# Patient Record
Sex: Female | Born: 1990 | Race: Black or African American | Hispanic: No | Marital: Married | Smoking: Former smoker
Health system: Southern US, Community
[De-identification: ages and names within clinical notes are randomized; demographics above are authoritative.]

## PROBLEM LIST (undated history)

## (undated) DIAGNOSIS — O24419 Gestational diabetes mellitus in pregnancy, unspecified control: Secondary | ICD-10-CM

## (undated) DIAGNOSIS — E669 Obesity, unspecified: Secondary | ICD-10-CM

## (undated) DIAGNOSIS — Z789 Other specified health status: Secondary | ICD-10-CM

## (undated) DIAGNOSIS — Z800 Family history of malignant neoplasm of digestive organs: Secondary | ICD-10-CM

## (undated) HISTORY — DX: Obesity, unspecified: E66.9

## (undated) HISTORY — DX: Family history of malignant neoplasm of digestive organs: Z80.0

## (undated) HISTORY — PX: APPENDECTOMY: SHX54

## (undated) HISTORY — DX: Gestational diabetes mellitus in pregnancy, unspecified control: O24.419

---
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
## 2008-04-03 ENCOUNTER — Emergency Department: Payer: Self-pay | Admitting: Emergency Medicine

## 2009-03-12 ENCOUNTER — Observation Stay: Payer: Self-pay

## 2009-06-17 ENCOUNTER — Observation Stay: Payer: Self-pay | Admitting: Obstetrics & Gynecology

## 2009-06-25 ENCOUNTER — Inpatient Hospital Stay: Payer: Self-pay | Admitting: Obstetrics and Gynecology

## 2009-08-06 ENCOUNTER — Emergency Department: Payer: Self-pay | Admitting: Emergency Medicine

## 2009-08-24 ENCOUNTER — Emergency Department: Payer: Self-pay | Admitting: Emergency Medicine

## 2009-10-02 ENCOUNTER — Emergency Department: Payer: Self-pay | Admitting: Internal Medicine

## 2009-10-14 ENCOUNTER — Emergency Department: Payer: Self-pay | Admitting: Emergency Medicine

## 2009-10-27 ENCOUNTER — Emergency Department: Payer: Self-pay | Admitting: Emergency Medicine

## 2010-10-11 ENCOUNTER — Emergency Department: Payer: Self-pay | Admitting: Emergency Medicine

## 2012-03-30 ENCOUNTER — Emergency Department: Payer: Self-pay | Admitting: *Deleted

## 2013-11-19 ENCOUNTER — Inpatient Hospital Stay: Payer: Self-pay | Admitting: Obstetrics & Gynecology

## 2013-11-19 LAB — CBC WITH DIFFERENTIAL/PLATELET
Basophil #: 0.1 10*3/uL (ref 0.0–0.1)
Basophil %: 0.8 %
Eosinophil %: 2.1 %
HCT: 37.9 % (ref 35.0–47.0)
HGB: 11.5 g/dL — ABNORMAL LOW (ref 12.0–16.0)
Lymphocyte %: 25 %
MCH: 23.8 pg — ABNORMAL LOW (ref 26.0–34.0)
MCHC: 30.4 g/dL — ABNORMAL LOW (ref 32.0–36.0)
MCV: 78 fL — ABNORMAL LOW (ref 80–100)
Monocyte %: 8.5 %
Neutrophil #: 4.2 10*3/uL (ref 1.4–6.5)
Neutrophil %: 63.6 %
Platelet: 290 10*3/uL (ref 150–440)
RBC: 4.83 10*6/uL (ref 3.80–5.20)
RDW: 14.2 % (ref 11.5–14.5)
WBC: 6.6 10*3/uL (ref 3.6–11.0)

## 2013-11-19 LAB — BASIC METABOLIC PANEL
Anion Gap: 9 (ref 7–16)
BUN: 7 mg/dL (ref 7–18)
Calcium, Total: 8.9 mg/dL (ref 8.5–10.1)
Chloride: 107 mmol/L (ref 98–107)
Co2: 24 mmol/L (ref 21–32)
EGFR (Non-African Amer.): >60
Glucose: 104 mg/dL — ABNORMAL HIGH (ref 65–99)
Osmolality: 278 (ref 275–301)

## 2013-11-19 LAB — SGOT (AST)(ARMC): SGOT(AST): 22 U/L (ref 15–37)

## 2013-11-20 LAB — PROTEIN / CREATININE RATIO, URINE
Creatinine, Urine: 61.6 mg/dL (ref 30.0–125.0)
Protein/Creat. Ratio: 1055 mg/gCREAT — ABNORMAL HIGH (ref 0–200)

## 2013-11-20 LAB — HEMATOCRIT: HCT: 32.6 % — ABNORMAL LOW (ref 35.0–47.0)

## 2013-11-20 LAB — GC/CHLAMYDIA PROBE AMP

## 2014-06-27 ENCOUNTER — Emergency Department: Payer: Self-pay | Admitting: Emergency Medicine

## 2014-06-27 LAB — WET PREP, GENITAL

## 2014-06-27 LAB — CBC WITH DIFFERENTIAL/PLATELET
BASOS ABS: 0.1 10*3/uL (ref 0.0–0.1)
BASOS PCT: 1.2 %
EOS ABS: 0.3 10*3/uL (ref 0.0–0.7)
EOS PCT: 6.8 %
HCT: 40 % (ref 35.0–47.0)
HGB: 12.7 g/dL (ref 12.0–16.0)
Lymphocyte #: 2.4 10*3/uL (ref 1.0–3.6)
Lymphocyte %: 46.6 %
MCH: 26 pg (ref 26.0–34.0)
MCHC: 31.8 g/dL — ABNORMAL LOW (ref 32.0–36.0)
MCV: 82 fL (ref 80–100)
MONOS PCT: 7 %
Monocyte #: 0.4 x10 3/mm (ref 0.2–0.9)
NEUTROS PCT: 38.4 %
Neutrophil #: 2 10*3/uL (ref 1.4–6.5)
PLATELETS: 237 10*3/uL (ref 150–440)
RBC: 4.89 10*6/uL (ref 3.80–5.20)
RDW: 13.8 % (ref 11.5–14.5)
WBC: 5.1 10*3/uL (ref 3.6–11.0)

## 2014-06-27 LAB — COMPREHENSIVE METABOLIC PANEL
ANION GAP: 9 (ref 7–16)
Albumin: 4.6 g/dL (ref 3.4–5.0)
Alkaline Phosphatase: 60 U/L
BUN: 14 mg/dL (ref 7–18)
Bilirubin,Total: 0.3 mg/dL (ref 0.2–1.0)
CALCIUM: 9.1 mg/dL (ref 8.5–10.1)
Chloride: 108 mmol/L — ABNORMAL HIGH (ref 98–107)
Co2: 24 mmol/L (ref 21–32)
Creatinine: 0.81 mg/dL (ref 0.60–1.30)
EGFR (Non-African Amer.): >60
Glucose: 92 mg/dL (ref 65–99)
Osmolality: 281 (ref 275–301)
Potassium: 4.1 mmol/L (ref 3.5–5.1)
SGOT(AST): 15 U/L (ref 15–37)
SGPT (ALT): 33 U/L
SODIUM: 141 mmol/L (ref 136–145)
Total Protein: 7.9 g/dL (ref 6.4–8.2)

## 2014-06-27 LAB — URINALYSIS, COMPLETE
BACTERIA: NONE SEEN
RBC,UR: 2268 /HPF (ref 0–5)
Specific Gravity: 1.024 (ref 1.003–1.030)
Squamous Epithelial: 2
WBC UR: 8 /HPF (ref 0–5)

## 2014-06-27 LAB — TROPONIN I

## 2014-06-27 LAB — GC/CHLAMYDIA PROBE AMP

## 2015-04-10 NOTE — H&P (Signed)
L&D Evaluation:  History Expanded:  HPI 24 yo [id number], EDD of 12/10/13, presents at 37w 0d with c/o ctx. [facility], failed 1 hour gtt, passed her 3 hour GTT, abnl Tetra with DS risk 1:136, negative Harmony, anemia tx with Fe. She presented after having a large gush of fluid at about 130 this morning.  She notes contiued positive fetal movement, contractions every 6 minutes, no vaginal bleeding.   Blood Type (Maternal) AB positive   Group B Strep Results Maternal (Result >5wks must be treated as unknown) positive   Maternal HIV Negative   Maternal Syphilis Ab Nonreactive   Maternal Varicella Immune   Rubella Results (Maternal) immune   EDC 24-Nov-2013   Patient's Medical History anxiety/depression, substance abuse   Patient's Surgical History Appendectomy   Medications Pre Natal Vitamins  Iron   Allergies NKDA   Social History history of + drug screen for cocaine, denies current   Exam:  Vital Signs occasional BP 140/90, but mostly normotensive, afebrile   General no apparent distress   Mental Status clear   Chest clear   Heart normal sinus rhythm   Abdomen gravid, non-tender   Estimated Fetal Weight Average for gestational age   Fetal Position cephalic   Back no CVAT   Edema no edema   Pelvic no external lesions, 4cm per RN   Mebranes Ruptured, grossly   Description clear   FHT normal rate with no decels   FHT Description 135/mod var/+Accels/no decels   Ucx 1-2 q 10 min   Impression:  Impression PROM   Plan:  Comments -admit for PROM -Fetal well being reassuring -GBS negative -pitocin for labor augmentation   Labs:  Lab Results:  Routine Chem:  20-Dec-14 03:18   Result Comment HGB/HCT - RESULTS VERIFIED BY REPEAT TESTING.  Result(s) reported on 19 Nov 2013 at 03:45AM.  Routine Hem:  20-Dec-14 03:18   WBC (CBC) 6.6  RBC (CBC) 4.83  Hemoglobin (CBC)  11.5  Hematocrit (CBC) 37.9  Platelet Count (CBC) 290  MCV  78  MCH  23.8  MCHC   30.4  RDW 14.2  Neutrophil % 63.6  Lymphocyte % 25.0  Monocyte % 8.5  Eosinophil % 2.1  Basophil % 0.8  Neutrophil # 4.2  Lymphocyte # 1.7  Monocyte # 0.6  Eosinophil # 0.1  Basophil # 0.1   Electronic Signatures: Vella KohlerBrothers, Tamara K (CNM)  (Signed 20-Dec-14 13:26)  Authored: L&D Evaluation Conard NovakJackson, Licia Harl D (MD)  (Signed 20-Dec-14 07:48)  Authored: L&D Evaluation, Labs   Last Updated: 20-Dec-14 13:26 by Vella KohlerBrothers, Tamara K (CNM)

## 2015-05-09 ENCOUNTER — Encounter: Payer: Self-pay | Admitting: *Deleted

## 2015-05-09 ENCOUNTER — Inpatient Hospital Stay
Admission: EM | Admit: 2015-05-09 | Discharge: 2015-05-11 | DRG: 153 | Disposition: A | Discharge status: Home / Self Care | Payer: Self-pay | Attending: Internal Medicine | Admitting: Internal Medicine

## 2015-05-09 DIAGNOSIS — M545 Low back pain, unspecified: Secondary | ICD-10-CM

## 2015-05-09 DIAGNOSIS — Z9049 Acquired absence of other specified parts of digestive tract: Secondary | ICD-10-CM | POA: Diagnosis present

## 2015-05-09 DIAGNOSIS — R6510 Systemic inflammatory response syndrome (SIRS) of non-infectious origin without acute organ dysfunction: Secondary | ICD-10-CM | POA: Diagnosis present

## 2015-05-09 DIAGNOSIS — M4646 Discitis, unspecified, lumbar region: Secondary | ICD-10-CM

## 2015-05-09 DIAGNOSIS — R509 Fever, unspecified: Secondary | ICD-10-CM

## 2015-05-09 DIAGNOSIS — M791 Myalgia, unspecified site: Secondary | ICD-10-CM

## 2015-05-09 DIAGNOSIS — R519 Headache, unspecified: Secondary | ICD-10-CM

## 2015-05-09 DIAGNOSIS — R51 Headache: Secondary | ICD-10-CM

## 2015-05-09 DIAGNOSIS — J029 Acute pharyngitis, unspecified: Principal | ICD-10-CM | POA: Diagnosis present

## 2015-05-09 DIAGNOSIS — Z885 Allergy status to narcotic agent status: Secondary | ICD-10-CM

## 2015-05-09 HISTORY — DX: Other specified health status: Z78.9

## 2015-05-09 LAB — CBC WITH DIFFERENTIAL/PLATELET
Basophils Absolute: 0 10*3/uL (ref 0–0.1)
Basophils Relative: 1 %
Eosinophils Absolute: 0.1 10*3/uL (ref 0–0.7)
Eosinophils Relative: 2 %
HEMATOCRIT: 40.2 % (ref 35.0–47.0)
HEMOGLOBIN: 12.6 g/dL (ref 12.0–16.0)
LYMPHS PCT: 12 %
Lymphs Abs: 1 10*3/uL (ref 1.0–3.6)
MCH: 25.2 pg — ABNORMAL LOW (ref 26.0–34.0)
MCHC: 31.3 g/dL — ABNORMAL LOW (ref 32.0–36.0)
MCV: 80.4 fL (ref 80.0–100.0)
MONO ABS: 0.6 10*3/uL (ref 0.2–0.9)
Monocytes Relative: 7 %
Neutro Abs: 6.7 10*3/uL — ABNORMAL HIGH (ref 1.4–6.5)
Neutrophils Relative %: 78 %
Platelets: 254 10*3/uL (ref 150–440)
RBC: 5.01 MIL/uL (ref 3.80–5.20)
RDW: 13.7 % (ref 11.5–14.5)
WBC: 8.4 10*3/uL (ref 3.6–11.0)

## 2015-05-09 LAB — URINALYSIS COMPLETE WITH MICROSCOPIC (ARMC ONLY)
Bacteria, UA: NONE SEEN
Bilirubin Urine: NEGATIVE
Glucose, UA: NEGATIVE mg/dL
Hgb urine dipstick: NEGATIVE
KETONES UR: NEGATIVE mg/dL
Leukocytes, UA: NEGATIVE
NITRITE: NEGATIVE
PROTEIN: NEGATIVE mg/dL
SPECIFIC GRAVITY, URINE: 1.021 (ref 1.005–1.030)
pH: 6 (ref 5.0–8.0)

## 2015-05-09 LAB — COMPREHENSIVE METABOLIC PANEL
ALBUMIN: 4.8 g/dL (ref 3.5–5.0)
ALT: 21 U/L (ref 14–54)
ANION GAP: 8 (ref 5–15)
AST: 17 U/L (ref 15–41)
Alkaline Phosphatase: 44 U/L (ref 38–126)
BILIRUBIN TOTAL: 0.6 mg/dL (ref 0.3–1.2)
BUN: 13 mg/dL (ref 6–20)
CALCIUM: 9.6 mg/dL (ref 8.9–10.3)
CO2: 25 mmol/L (ref 22–32)
CREATININE: 0.92 mg/dL (ref 0.44–1.00)
Chloride: 105 mmol/L (ref 101–111)
GFR calc Af Amer: >60 mL/min (ref >60)
GFR calc non Af Amer: >60 mL/min (ref >60)
Glucose, Bld: 113 mg/dL — ABNORMAL HIGH (ref 65–99)
POTASSIUM: 3.9 mmol/L (ref 3.5–5.1)
Sodium: 138 mmol/L (ref 135–145)
Total Protein: 7.9 g/dL (ref 6.5–8.1)

## 2015-05-09 LAB — CK: CK TOTAL: 146 U/L (ref 38–234)

## 2015-05-09 LAB — LACTIC ACID, PLASMA: Lactic Acid, Venous: 1.5 mmol/L (ref 0.5–2.0)

## 2015-05-09 LAB — HCG, QUANTITATIVE, PREGNANCY: hCG, Beta Chain, Quant, S: <1 m[IU]/mL (ref <5)

## 2015-05-09 MED ORDER — ACETAMINOPHEN 325 MG PO TABS
ORAL_TABLET | ORAL | Status: AC
  Filled 2015-05-09: qty 2

## 2015-05-09 MED ORDER — LIDOCAINE HCL (PF) 1 % IJ SOLN
INTRAMUSCULAR | Status: AC
  Administered 2015-05-09: 5 mL
  Filled 2015-05-09: qty 5

## 2015-05-09 MED ORDER — SODIUM CHLORIDE 0.9 % IV BOLUS (SEPSIS)
1000.0000 mL | Freq: Once | INTRAVENOUS | Status: AC
  Administered 2015-05-09: 1000 mL via INTRAVENOUS

## 2015-05-09 MED ORDER — VANCOMYCIN HCL IN DEXTROSE 1-5 GM/200ML-% IV SOLN
1000.0000 mg | Freq: Once | INTRAVENOUS | Status: AC
  Administered 2015-05-09: 1000 mg via INTRAVENOUS

## 2015-05-09 MED ORDER — ACETAMINOPHEN 325 MG PO TABS
650.0000 mg | ORAL_TABLET | Freq: Four times a day (QID) | ORAL | Status: DC | PRN
  Administered 2015-05-09 – 2015-05-11 (×5): 650 mg via ORAL
  Filled 2015-05-09 (×4): qty 2

## 2015-05-09 MED ORDER — VANCOMYCIN HCL IN DEXTROSE 1-5 GM/200ML-% IV SOLN
INTRAVENOUS | Status: AC
  Administered 2015-05-09: 1000 mg via INTRAVENOUS
  Filled 2015-05-09: qty 200

## 2015-05-09 MED ORDER — CEFTRIAXONE SODIUM IN DEXTROSE 40 MG/ML IV SOLN
2.0000 g | Freq: Once | INTRAVENOUS | Status: AC
  Administered 2015-05-10: 2 g via INTRAVENOUS
  Filled 2015-05-09: qty 50

## 2015-05-09 NOTE — ED Notes (Addendum)
Pt to ED from home c/o lower back ache and whole body aches starting this morning and getting worse.  Pt states pain radiates to bilateral legs, abdomen, arms, and neck.  Pt abd non tender to palpation.  Pt very tender to right flank.  Pt reports having a headache and sweating.  Denies n/v/d, denies urinary symptoms.

## 2015-05-09 NOTE — ED Notes (Signed)
Pt reports onset of lower back pain, body aches, cold chills and headache today.

## 2015-05-09 NOTE — ED Provider Notes (Signed)
[facility] Emergency Department Provider Note  ____________________________________________  Time seen: Approximately 10:14 PM  I have reviewed the triage vital signs and the nursing notes.   HISTORY  Chief Complaint Generalized Body Aches    HPI Sarah Holland is a 24 y.o. female denies past medical history. Patient was at work today when she started noticing she was having aching in her lower back area and this progressively worsened throughout the day and she began to have shaking chills. She's been having some slight nausea. States she has a lot of pain in her back and muscles. She has also noticed that she now has a mild headache, and her neck also feels sore and stiff.  She denies any previous medical history. 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 She currently reports that she has severe body aches all over.  She denies any tick bites. No travel. No known exposure to any particular infection. She does have her flu shot this year. There is no cough or shortness of breath. No chest pain. No rash.   History reviewed. No pertinent past medical history.  There are no active problems to display for this patient.   History reviewed. No pertinent past surgical history.  No current outpatient prescriptions on file.  Allergies Oxycodone  No family history on file.  Social History History  Substance Use Topics  . Smoking status: Never Smoker   . Smokeless tobacco: Not on file  . Alcohol Use: No    Review of Systems Constitutional: Fevers and chills. Fatigue. Eyes: No visual changes. ENT: No sore throat. Cardiovascular: Denies chest pain. Respiratory: Denies shortness of breath. Gastrointestinal: No abdominal pain though her back is aching.  No nausea, no vomiting.  No diarrhea.  No constipation. Genitourinary: Negative for dysuria. Musculoskeletal: See history of present illness Skin: Negative for rash. Neurological:  Negative for focal weakness or numbness. She does have a mild to moderate headache.  10-point ROS otherwise negative.  ____________________________________________   PHYSICAL EXAM:  VITAL SIGNS: ED Triage Vitals  Enc Vitals Group     BP 05/09/15 2032 137/83 mmHg     Pulse Rate 05/09/15 2032 122     Resp 05/09/15 2032 18     Temp 05/09/15 2032 103 F (39.4 C)     Temp Source 05/09/15 2032 Oral     SpO2 05/09/15 2032 98 %     Weight 05/09/15 2032 198 lb (89.812 kg)     Height 05/09/15 2032  (1.651 m)     Head Cir --      Peak Flow --      Pain Score 05/09/15 2033 10     Pain Loc --      Pain Edu? --      Excl. in GC? --     Constitutional: Alert and oriented. Fatigued and ill appearing. Right ears. Eyes: Conjunctivae are normal. PERRL. EOMI. mild photophobia. Head: Atraumatic. Nose: No congestion/rhinnorhea. Mouth/Throat: Mucous membranes are moist.  Oropharynx non-erythematous. Neck: No stridor. Patient does exhibit mild meningismus. Jolt accentuation testing is positive. Cardiovascular: Elevated rate, regular rhythm. Grossly normal heart sounds.  Good peripheral circulation. Respiratory: Normal respiratory effort.  No retractions. Lungs CTAB. Gastrointestinal: Soft and nontender. No distention. No abdominal bruits. No CVA tenderness. Musculoskeletal: No lower extremity tenderness nor edema.  No joint effusions. Neurologic:  Normal speech and language. No gross focal neurologic deficits are appreciated. Speech is normal. No gait instability. Skin:  Skin is  warm, dry and intact. No rash noted. Psychiatric: Mood and affect are normal. Speech and behavior are normal.  ____________________________________________   LABS (all labs ordered are listed, but only abnormal results are displayed)  Labs Reviewed  URINALYSIS COMPLETEWITH MICROSCOPIC ([facility] ONLY) - Abnormal; Notable for the following:    Color, Urine YELLOW (*)    APPearance CLEAR (*)    Squamous Epithelial /  LPF 0-5 (*)    All other components within normal limits  CBC WITH DIFFERENTIAL/PLATELET - Abnormal; Notable for the following:    MCH 25.2 (*)    MCHC 31.3 (*)    Neutro Abs 6.7 (*)    All other components within normal limits  COMPREHENSIVE METABOLIC PANEL - Abnormal; Notable for the following:    Glucose, Bld 113 (*)    All other components within normal limits  CULTURE, BLOOD (ROUTINE X 2)  CULTURE, BLOOD (ROUTINE X 2)  LACTIC ACID, PLASMA  CK  HCG, QUANTITATIVE, PREGNANCY  LACTIC ACID, PLASMA  LACTIC ACID, PLASMA  PROTIME-INR  APTT  CSF CELL COUNT WITH DIFFERENTIAL  HERPES SIMPLEX VIRUS(HSV) DNA BY PCR   ____________________________________________  EKG   ____________________________________________  RADIOLOGY   ____________________________________________   PROCEDURES  Procedure(s) performed: None  Critical Care performed: No  ----------------------------------------- 10:15 PM on 05/09/2015 -----------------------------------------  After approximately 5 minutes into the patient's lumbar puncture procedure, she became nauseated and felt that she was going to throw up. He had to stop the procedure. 2 needle insertions were attempted at the L3-L4 level. Patient is neurovascularly intact thereafter. At this point, we will continue to hydrate the patient, give anti-emedics, and I will ask interventional radiology to make the next attempt. Procedure was performed because of the patient's headache, she does have positive jolt accentuation, and severe pain in her neck and back. They wish to rule out meningitis. Timeout was performed, consent was signed and risk reviewed with the patient prior to the procedure. She tolerated the procedure well, except for I believe she developed vasovagal type symptoms after sitting up for about 5-10 minutes.  LUMBAR PUNCTURE  Date/Time: 05/09/2015 at 10:16 PM Performed by: Sharyn CreamerQUALE, MARK  Consent: Verbal consent obtained. Written  consent obtained. Risks and benefits: risks, benefits and alternatives were discussed Consent given by: patient Patient understanding: patient states understanding of the procedure being performed  Patient consent: the patient's understanding of the procedure matches consent given  Procedure consent: procedure consent matches procedure scheduled  Relevant documents: relevant documents present and verified  Test results: test results available and properly labeled Site marked: the operative site was marked Imaging studies: imaging studies available  Required items: required blood products, implants, devices, and special equipment available  Patient identity confirmed: verbally with patient and arm band  Time out: Immediately prior to procedure a "time out" was called to verify the correct patient, procedure, equipment, support staff and site/side marked as required.  Indications: eval for meningitis, headache, neck pain, fever Anesthesia: local infiltration Local anesthetic: lidocaine 1% without epinephrine Anesthetic total: 4 ml Patient sedated: none Analgesia: local Preparation: Patient was prepped and draped in the usual sterile fashion. Lumbar space: L3-L4 interspace Patient's position: left lateral decubitus Needle gauge: 22 Needle length: 3.5 in Number of attempts: 2 Opening pressure:  Fluid appearance:  Tubes of fluid:  Total volume:  Post-procedure: site cleaned and adhesive bandage applied Patient tolerance: Patient tolerated the procedure well until she became nauseated. We had to stop the procedure because she got nauseous, and felt like she  is going to throw up. The patient was neurovascularly intact after the procedure.  ____________________________________________   INITIAL IMPRESSION / ASSESSMENT AND PLAN / ED COURSE  Pertinent labs & imaging results that were available during my care of the patient were reviewed by me and considered in my medical decision making  (see chart for details).  Patient presents with fever, generalized body aches, headache, and feeling of a stiff neck. This all started with pain and stiffness in her lower back. She has no respiratory or chest symptoms. She has no upper respiratory symptoms. She does have a headache, and given her jolt accentuation is positive meningitis is on the differential. Her urinalysis is clean. Her abdomen is nontender, and she is status post appendectomy. At this point, we will hydrate her and treat her pain, and will proceed with having interventional radiology perform a spinal tap to rule out meningitis. ____________________________________________ ----------------------------------------- 11:59 PM on 05/09/2015 -----------------------------------------  She is awake and alert. Her heart rate is improved to the 80s. She does appear improved, she does still have a moderate headache though. She is neurologically intact. At this point, I discussed with interventional radiology and they'll perform lumbar puncture in the morning. We are covering her with antibiotic's as we rule out meningitis. I discussed the patient her family, they're all agreeable with admission to the hospital, antibiotic's, and the plan of care. Based on her symptomatology, I would not favor this being meningitis due to strep pneumo.  Patient continues to deny abdominal pain, chest pain or shortness of breath. She does appear overall improved. At this point, I have turned over care to the hospitalist service Dr. Betti Cruz.  Admitted  FINAL CLINICAL IMPRESSION(S) / ED DIAGNOSES  Final diagnoses:  Headache  Fever chills  Myalgia   systemic inflammatory response syndrome Headache Fever Rule out meningitis    Sharyn Creamer, MD 05/10/15 0003

## 2015-05-10 ENCOUNTER — Inpatient Hospital Stay: Payer: Self-pay

## 2015-05-10 ENCOUNTER — Encounter: Payer: Self-pay | Admitting: Internal Medicine

## 2015-05-10 DIAGNOSIS — R6510 Systemic inflammatory response syndrome (SIRS) of non-infectious origin without acute organ dysfunction: Secondary | ICD-10-CM | POA: Diagnosis present

## 2015-05-10 DIAGNOSIS — R509 Fever, unspecified: Secondary | ICD-10-CM | POA: Diagnosis present

## 2015-05-10 LAB — APTT: aPTT: 29 seconds (ref 24–36)

## 2015-05-10 LAB — CSF CELL COUNT WITH DIFFERENTIAL
Eosinophils, CSF: 0 %
LYMPHS CSF: 75 %
MONOCYTE-MACROPHAGE-SPINAL FLUID: 25 %
OTHER CELLS CSF: 0
RBC COUNT CSF: 3 /mm3 (ref 0–3)
RBC Count, CSF: 15 /mm3 — ABNORMAL HIGH (ref 0–3)
SEGMENTED NEUTROPHILS-CSF: 0 %
Tube #: 3
Tube #: 4
WBC CSF: 0 /mm3
WBC, CSF: 6 /mm3

## 2015-05-10 LAB — PROTIME-INR
INR: 1
Prothrombin Time: 13.4 s (ref 11.4–15.0)

## 2015-05-10 LAB — SEDIMENTATION RATE: SED RATE: 7 mm/h (ref 0–20)

## 2015-05-10 LAB — CBC
HCT: 36.1 % (ref 35.0–47.0)
Hemoglobin: 11.5 g/dL — ABNORMAL LOW (ref 12.0–16.0)
MCH: 25.5 pg — AB (ref 26.0–34.0)
MCHC: 31.7 g/dL — ABNORMAL LOW (ref 32.0–36.0)
MCV: 80.5 fL (ref 80.0–100.0)
Platelets: 224 10*3/uL (ref 150–440)
RBC: 4.49 MIL/uL (ref 3.80–5.20)
RDW: 13.9 % (ref 11.5–14.5)
WBC: 8.1 10*3/uL (ref 3.6–11.0)

## 2015-05-10 LAB — INFLUENZA PANEL BY PCR (TYPE A & B)
H1N1 flu by pcr: NOT DETECTED
INFLAPCR: NEGATIVE
Influenza B By PCR: NEGATIVE

## 2015-05-10 LAB — GLUCOSE, CSF: Glucose, CSF: 76 mg/dL — ABNORMAL HIGH (ref 40–70)

## 2015-05-10 LAB — RAPID HIV SCREEN (HIV 1/2 AB+AG)
HIV 1/2 Antibodies: NONREACTIVE
HIV-1 P24 Antigen - HIV24: NONREACTIVE

## 2015-05-10 LAB — PROTEIN, CSF: Total  Protein, CSF: 16 mg/dL (ref 15–45)

## 2015-05-10 MED ORDER — DOXYCYCLINE HYCLATE 100 MG PO TABS
100.0000 mg | ORAL_TABLET | Freq: Two times a day (BID) | ORAL | Status: DC
  Administered 2015-05-10 – 2015-05-11 (×3): 100 mg via ORAL
  Filled 2015-05-10 (×3): qty 1

## 2015-05-10 MED ORDER — VANCOMYCIN HCL IN DEXTROSE 1-5 GM/200ML-% IV SOLN
1000.0000 mg | Freq: Three times a day (TID) | INTRAVENOUS | Status: DC
  Administered 2015-05-10: 1000 mg via INTRAVENOUS
  Filled 2015-05-10 (×5): qty 200

## 2015-05-10 MED ORDER — CEFTRIAXONE SODIUM IN DEXTROSE 40 MG/ML IV SOLN
2.0000 g | Freq: Two times a day (BID) | INTRAVENOUS | Status: DC
  Administered 2015-05-10: 2 g via INTRAVENOUS
  Filled 2015-05-10 (×3): qty 50

## 2015-05-10 MED ORDER — HYDROMORPHONE HCL 1 MG/ML IJ SOLN
0.5000 mg | INTRAMUSCULAR | Status: DC | PRN
  Administered 2015-05-11 (×2): 0.5 mg via INTRAVENOUS
  Filled 2015-05-10 (×3): qty 1

## 2015-05-10 MED ORDER — ENOXAPARIN SODIUM 40 MG/0.4ML ~~LOC~~ SOLN
40.0000 mg | SUBCUTANEOUS | Status: DC
  Administered 2015-05-11: 40 mg via SUBCUTANEOUS
  Filled 2015-05-10: qty 0.4

## 2015-05-10 MED ORDER — CEFTRIAXONE SODIUM IN DEXTROSE 20 MG/ML IV SOLN
1.0000 g | INTRAVENOUS | Status: DC
  Administered 2015-05-11: 1 g via INTRAVENOUS
  Filled 2015-05-10 (×2): qty 50

## 2015-05-10 NOTE — ED Notes (Signed)
Lumbar puncture attempted by Dr. Fanny Bien two times, unsuccessful.

## 2015-05-10 NOTE — Outcomes Assessment (Signed)
VSS, patient is A+O with no signs of distress. Complaints of back pain which is controlled with prn tylenol.  Up ad lib. Voids without difficulty. On IV antibiotics.

## 2015-05-10 NOTE — Care Management (Signed)
Spoke with patient by phone and her mother was at bedside. She does not have health insurance or a PCP but agrees to [facility] referral which I have sent. Application for Medication management and [facility] delivered to patient's room.

## 2015-05-10 NOTE — H&P (Signed)
[facility] - [place] at [facility]   PATIENT NAME: Sarah Holland    MR#:  [id number]  DATE OF BIRTH:  04-21-1991  DATE OF ADMISSION:  05/09/2015  PRIMARY CARE PHYSICIAN: PROVIDER NOT IN SYSTEM   REQUESTING/REFERRING PHYSICIAN: Sharyn Creamer  CHIEF COMPLAINT:   Chief Complaint  Patient presents with  . Generalized Body Aches   chills, fever, backache, headache since last evening  HISTORY OF PRESENT ILLNESS:  Sarah Holland  is a 24 y.o. female with no significant past medical history presents to the emergency room with the complaint of acute onset of back pain with associated generalized body aches, chills, found to be febrile with temperature 10 24F on arrival to the ED. Patient denies any recent URI symptoms, cough, nausea, vomiting, diarrhea, abdominal pain, dysuria. Denies any recent febrile illness. Patient denies any photophobia, focal weakness or numbness. Workup in the ED revealed normal CBC and UA unremarkable for any UTI. In view of fever with associated headache and stiff neck, diagnosis of possible meningitis was entertained by the ED physician and lumbar puncture was attempted in the ED which was unsuccessful 2 as patient could not tolerate. Patient was placed on droplet isolation, blood and urine cultures were sent and started on IV antibiotics namely vancomycin and ceftriaxone to cover for possible meningitis. Interventional radiology service was consulted by the ED physician for fluoroscopic guided lumbar puncture and is scheduled for tomorrow morning. Patient received IV fluids and Tylenol following which her heart rate is improving and headache is also improving.  PAST MEDICAL HISTORY:   Past Medical History  Diagnosis Date  . Medical history non-contributory     PAST SURGICAL HISTORY:   Past Surgical History  Procedure Laterality Date  . Appendectomy      SOCIAL HISTORY:   History  Substance Use Topics  . Smoking status: Current Every  Day Smoker  . Smokeless tobacco: Not on file  . Alcohol Use: No    FAMILY HISTORY:  History reviewed. No pertinent family history.  DRUG ALLERGIES:   Allergies  Allergen Reactions  . Oxycodone Other (See Comments)    Reaction: unknown    REVIEW OF SYSTEMS:   Review of Systems  Constitutional: Positive for fever and chills. Negative for malaise/fatigue.  HENT: Negative for ear pain, hearing loss, nosebleeds, sore throat and tinnitus.   Eyes: Negative for blurred vision, double vision, pain, discharge and redness.  Respiratory: Negative for cough, hemoptysis, sputum production, shortness of breath and wheezing.   Cardiovascular: Negative for chest pain, palpitations, orthopnea and leg swelling.  Gastrointestinal: Negative for nausea, vomiting, abdominal pain, diarrhea, constipation, blood in stool and melena.  Genitourinary: Negative for dysuria, urgency, frequency and hematuria.  Musculoskeletal: Positive for myalgias and back pain. Negative for joint pain and neck pain.  Skin: Negative for itching and rash.  Neurological: Positive for headaches. Negative for dizziness, tingling, sensory change, focal weakness and seizures.  Endo/Heme/Allergies: Does not bruise/bleed easily.  Psychiatric/Behavioral: Negative for depression. The patient is not nervous/anxious.     MEDICATIONS AT HOME:   Prior to Admission medications   Not on File      VITAL SIGNS:  Blood pressure 137/83, pulse 122, temperature 103 F (39.4 C), temperature source Oral, resp. rate 18, height 5\' 5"  (1.651 m), weight 89.812 kg (198 lb), SpO2 98 %.  PHYSICAL EXAMINATION:  Physical Exam  Constitutional: She is oriented to person, place, and time. She appears well-developed and well-nourished.  Mild distress because of headache.  HENT:  Head: Normocephalic and atraumatic.  Right Ear: External ear normal.  Left Ear: External ear normal.  Nose: Nose normal.  Mouth/Throat: Oropharynx is clear and moist. No  oropharyngeal exudate.  Eyes: EOM are normal. Pupils are equal, round, and reactive to light. No scleral icterus.  No photophobia.  Neck: Normal range of motion. Neck supple. No JVD present. No thyromegaly present.  Cardiovascular: Normal rate, regular rhythm, normal heart sounds and intact distal pulses.  Exam reveals no friction rub.   No murmur heard. Respiratory: Effort normal and breath sounds normal. No respiratory distress. She has no wheezes. She has no rales. She exhibits no tenderness.  GI: Soft. Bowel sounds are normal. She exhibits no distension and no mass. There is no tenderness. There is no rebound and no guarding.  Musculoskeletal: Normal range of motion. She exhibits no edema.  Lymphadenopathy:    She has no cervical adenopathy.  Neurological: She is alert and oriented to person, place, and time. She has normal reflexes. She displays normal reflexes. No cranial nerve deficit. She exhibits normal muscle tone.  Skin: Skin is warm. No rash noted. No erythema.  Psychiatric: She has a normal mood and affect. Her behavior is normal. Thought content normal.   LABORATORY PANEL:   CBC  Recent Labs Lab 05/09/15 2043  WBC 8.4  HGB 12.6  HCT 40.2  PLT 254   ------------------------------------------------------------------------------------------------------------------  Chemistries   Recent Labs Lab 05/09/15 2043  NA 138  K 3.9  CL 105  CO2 25  GLUCOSE 113*  BUN 13  CREATININE 0.92  CALCIUM 9.6  AST 17  ALT 21  ALKPHOS 44  BILITOT 0.6   ------------------------------------------------------------------------------------------------------------------  Cardiac Enzymes No results for input(s): TROPONINI in the last 168 hours. ------------------------------------------------------------------------------------------------------------------  RADIOLOGY:  No results found.  EKG:  No orders found for this or any previous visit.  IMPRESSION AND PLAN:   1.  SIRS. 2. Febrile illness with associated headache, backache and myalgias. Rule out meningitis. Plan: Admit to MedSurg, continue droplet isolation, continue IV antibiotics-vancomycin and ceftriaxone, follow up with IR for fluoroscopy guided lumbar puncture and follow-up with spinal fluid analysis, Tylenol when necessary, pain medications as needed.    All the records are reviewed and case discussed with ED provider. Management plans discussed with the patient, family and they are in agreement.  CODE STATUS: Full code  TOTAL TIME TAKING CARE OF THIS PATIENT: 45 minutes.    Jonnie Kind N M.D on 05/10/2015 at 12:31 AM  Between 7am to 6pm - Pager - [phone]  After 6pm go to [email or web address] - password [email or web address] [facility]  [facility]  Office  [phone]  CC: Primary care physician; PROVIDER NOT IN SYSTEM

## 2015-05-10 NOTE — Consult Note (Signed)
[facility]     Reason for Consult: Headache, fever.     Referring Physician: Greig Right Date of Admission:  05/09/2015   Principal Problem:   SIRS (systemic inflammatory response syndrome) Active Problems:   Fever   HPI: Sarah Holland is a 24 y.o. female admitted 5/8 with relatively acute onset of LBP followed by pain down legs then HA, and , chills over a few hours. Came to ED and had temp 103.  Had attempted LP x 2 then admited on vanco and ceftraixone. Had LP this AM relatively nml She reports feeling normal prior to yesterday. She was a work at [employer] when pain began.  No recent URI, UTI sxs. No cough, sinus congestion. Has had a little sore throat and R sided cervical pain this AM but none prior.  No diarrhea, no rash, joint pain or swelling.  No recent procedures, skin infections, dental visits, etc. No tick bites, no outdoor exposures. Sexually active with one partner and no hx of STD - no genital complaints Lives with her 2 children aged 26,5. Neither have been ill. No other sick contacts  Past Medical History  Diagnosis Date  . Medical history non-contributory    Past Surgical History  Procedure Laterality Date  . Appendectomy     History  Substance Use Topics  . Smoking status: Current Every Day Smoker -- 0.25 packs/day for 3 years    Types: Cigarettes  . Smokeless tobacco: Not on file  . Alcohol Use: No   History reviewed. No pertinent family history.  Allergies:  Allergies  Allergen Reactions  . Oxycodone Other (See Comments)    Reaction: unknown    Current antibiotics: Antibiotics Given (last 72 hours)    Date/Time Action Medication Dose Rate   05/10/15 0542 Given   vancomycin (VANCOCIN) IVPB 1000 mg/200 mL premix 1,000 mg 200 mL/hr   05/10/15 1243 Given   cefTRIAXone (ROCEPHIN) 2 g in dextrose 5 % 50 mL IVPB - Premix 2 g 100 mL/hr      MEDICATIONS: . cefTRIAXone (ROCEPHIN)  IV  2 g Intravenous  Q12H  . enoxaparin (LOVENOX) injection  40 mg Subcutaneous Q24H  . vancomycin  1,000 mg Intravenous Q8H    Review of Systems - 11 systems reviewed and negative per HPI   OBJECTIVE: Temp:  [98.5 F (36.9 C)-103 F (39.4 C)] 101.3 F (38.5 C) (06/09 0914) Pulse Rate:  [79-122] 88 (06/09 0914) Resp:  [18] 18 (06/09 0914) BP: (108-137)/(54-84) 132/84 mmHg (06/09 0914) SpO2:  [98 %-100 %] 98 % (06/09 0914) Weight:  [89.4 kg (197 lb 1.5 oz)-89.812 kg (198 lb)] 89.4 kg (197 lb 1.5 oz) (06/09 0500) Physical Exam  Constitutional:  oriented to person, place, and time. appears well-developed and well-nourished. No distress.  HENT: [place]/AT, PERRLA, no scleral icterus Mouth/Throat: Oropharynx is clear and moist. Mild tonsilar erythema,  Cardiovascular: Normal rate, regular rhythm and normal heart sounds. Exam reveals no gallop and no friction rub.  No murmur heard.  Pulmonary/Chest: Effort normal and breath sounds normal. No respiratory distress.  has no wheezes.  Neck very mild pain with forward flexion but supple and no  nuchal rigidity Abdominal: Soft. Bowel sounds are normal.  exhibits no distension. There is no tenderness.  Lymphadenopathy: mild shoddy L ant cervical adenopathy. No axillary adenopathy Neurological: alert and oriented to person, place, and time.  Skin: Skin is warm and dry. No rash noted. No erythema.  Psychiatric: a normal mood and  affect.  behavior is normal.  Back - ttp over L spine  LABS: Results for orders placed or performed during the hospital encounter of 05/09/15 (from the past 48 hour(s))  Urinalysis complete, with microscopic [facility])     Status: Abnormal   Collection Time: 05/09/15  8:43 PM  Result Value Ref Range   Color, Urine YELLOW (A) YELLOW   APPearance CLEAR (A) CLEAR   Glucose, UA NEGATIVE NEGATIVE mg/dL   Bilirubin Urine NEGATIVE NEGATIVE   Ketones, ur NEGATIVE NEGATIVE mg/dL   Specific Gravity, Urine 1.021 1.005 - 1.030   Hgb urine dipstick  NEGATIVE NEGATIVE   pH 6.0 5.0 - 8.0   Protein, ur NEGATIVE NEGATIVE mg/dL   Nitrite NEGATIVE NEGATIVE   Leukocytes, UA NEGATIVE NEGATIVE   RBC / HPF 0-5 0 - 5 RBC/hpf   WBC, UA 0-5 0 - 5 WBC/hpf   Bacteria, UA NONE SEEN NONE SEEN   Squamous Epithelial / LPF 0-5 (A) NONE SEEN   Mucous PRESENT   CBC WITH DIFFERENTIAL     Status: Abnormal   Collection Time: 05/09/15  8:43 PM  Result Value Ref Range   WBC 8.4 3.6 - 11.0 K/uL   RBC 5.01 3.80 - 5.20 MIL/uL   Hemoglobin 12.6 12.0 - 16.0 g/dL   HCT 40.2 35.0 - 47.0 %   MCV 80.4 80.0 - 100.0 fL   MCH 25.2 (L) 26.0 - 34.0 pg   MCHC 31.3 (L) 32.0 - 36.0 g/dL   RDW 13.7 11.5 - 14.5 %   Platelets 254 150 - 440 K/uL   Neutrophils Relative % 78 %   Neutro Abs 6.7 (H) 1.4 - 6.5 K/uL   Lymphocytes Relative 12 %   Lymphs Abs 1.0 1.0 - 3.6 K/uL   Monocytes Relative 7 %   Monocytes Absolute 0.6 0.2 - 0.9 K/uL   Eosinophils Relative 2 %   Eosinophils Absolute 0.1 0 - 0.7 K/uL   Basophils Relative 1 %   Basophils Absolute 0.0 0 - 0.1 K/uL  Comprehensive metabolic panel     Status: Abnormal   Collection Time: 05/09/15  8:43 PM  Result Value Ref Range   Sodium 138 135 - 145 mmol/L   Potassium 3.9 3.5 - 5.1 mmol/L   Chloride 105 101 - 111 mmol/L   CO2 25 22 - 32 mmol/L   Glucose, Bld 113 (H) 65 - 99 mg/dL   BUN 13 6 - 20 mg/dL   Creatinine, Ser 0.92 0.44 - 1.00 mg/dL   Calcium 9.6 8.9 - 10.3 mg/dL   Total Protein 7.9 6.5 - 8.1 g/dL   Albumin 4.8 3.5 - 5.0 g/dL   AST 17 15 - 41 U/L   ALT 21 14 - 54 U/L   Alkaline Phosphatase 44 38 - 126 U/L   Total Bilirubin 0.6 0.3 - 1.2 mg/dL   GFR calc non Af Amer >60 >60 mL/min   GFR calc Af Amer >60 >60 mL/min    Comment: (NOTE) The eGFR has been calculated using the CKD EPI equation. This calculation has not been validated in all clinical situations. eGFR's persistently <60 mL/min signify possible Chronic Kidney Disease.    Anion gap 8 5 - 15  CK     Status: None   Collection Time: 05/09/15   8:43 PM  Result Value Ref Range   Total CK 146 38 - 234 U/L  hCG, quantitative, pregnancy     Status: None   Collection Time: 05/09/15  8:43 PM  Result Value Ref Range   hCG, Beta Chain, Quant, S <1 <5 mIU/mL    Comment:          GEST. AGE      CONC.  (mIU/mL)   <=1 WEEK        5 - 50     2 WEEKS       50 - 500     3 WEEKS       100 - 10,000     4 WEEKS     1,000 - 30,000     5 WEEKS     3,500 - 115,000   6-8 WEEKS     12,000 - 270,000    12 WEEKS     15,000 - 220,000        FEMALE AND NON-PREGNANT FEMALE:     LESS THAN 5 mIU/mL   Protime-INR     Status: None   Collection Time: 05/09/15  8:43 PM  Result Value Ref Range   Prothrombin Time 13.4 11.4 - 15.0 seconds   INR 1.00   APTT     Status: None   Collection Time: 05/09/15  8:43 PM  Result Value Ref Range   aPTT 29 24 - 36 seconds  Lactic acid, plasma     Status: None   Collection Time: 05/09/15 10:35 PM  Result Value Ref Range   Lactic Acid, Venous 1.5 0.5 - 2.0 mmol/L  Blood culture (routine x 2)     Status: None (Preliminary result)   Collection Time: 05/09/15 10:35 PM  Result Value Ref Range   Specimen Description BLOOD    Special Requests NONE    Culture NO GROWTH < 12 HOURS    Report Status PENDING   Blood culture (routine x 2)     Status: None (Preliminary result)   Collection Time: 05/09/15 10:35 PM  Result Value Ref Range   Specimen Description BLOOD    Special Requests NONE    Culture NO GROWTH < 12 HOURS    Report Status PENDING   CBC     Status: Abnormal   Collection Time: 05/10/15  5:39 AM  Result Value Ref Range   WBC 8.1 3.6 - 11.0 K/uL   RBC 4.49 3.80 - 5.20 MIL/uL   Hemoglobin 11.5 (L) 12.0 - 16.0 g/dL   HCT 36.1 35.0 - 47.0 %   MCV 80.5 80.0 - 100.0 fL   MCH 25.5 (L) 26.0 - 34.0 pg   MCHC 31.7 (L) 32.0 - 36.0 g/dL   RDW 13.9 11.5 - 14.5 %   Platelets 224 150 - 440 K/uL  CSF cell count with differential collection tube #: 4     Status: Abnormal (Preliminary result)   Collection Time:  05/10/15  8:50 AM  Result Value Ref Range   Tube # 4    Color, CSF COLORLESS COLORLESS   Appearance, CSF CLEAR CLEAR   RBC Count, CSF 15 (H) 0 - 3 /cu mm   WBC, CSF 6 /cu mm   Segmented Neutrophils-CSF PENDING 0 - 6 %   Lymphs, CSF PENDING 40 - 80 %   Monocyte-Macrophage-Spinal Fluid PENDING 15 - 45 %   Eosinophils, CSF PENDING 0 - 1 %   Other Cells, CSF PENDING   Glucose, CSF     Status: Abnormal   Collection Time: 05/10/15  8:50 AM  Result Value Ref Range   Glucose, CSF 76 (H) 40 - 70 mg/dL  Protein, CSF     Status:  None   Collection Time: 05/10/15  8:50 AM  Result Value Ref Range   Total  Protein, CSF 16 15 - 45 mg/dL  CSF cell count with differential     Status: None   Collection Time: 05/10/15  8:50 AM  Result Value Ref Range   Tube # 3    Color, CSF COLORLESS COLORLESS   Appearance, CSF CLEAR CLEAR   RBC Count, CSF 3 0 - 3 /cu mm   WBC, CSF 0 /cu mm  CSF culture     Status: None (Preliminary result)   Collection Time: 05/10/15  8:50 AM  Result Value Ref Range   Specimen Description CSF    Special Requests NONE    Gram Stain RARE WBC SEEN NO ORGANISMS SEEN     Culture PENDING    Report Status PENDING    No components found for: ESR, C REACTIVE PROTEIN MICRO: Recent Results (from the past 720 hour(s))  Blood culture (routine x 2)     Status: None (Preliminary result)   Collection Time: 05/09/15 10:35 PM  Result Value Ref Range Status   Specimen Description BLOOD  Final   Special Requests NONE  Final   Culture NO GROWTH < 12 HOURS  Final   Report Status PENDING  Incomplete  Blood culture (routine x 2)     Status: None (Preliminary result)   Collection Time: 05/09/15 10:35 PM  Result Value Ref Range Status   Specimen Description BLOOD  Final   Special Requests NONE  Final   Culture NO GROWTH < 12 HOURS  Final   Report Status PENDING  Incomplete  CSF culture     Status: None (Preliminary result)   Collection Time: 05/10/15  8:50 AM  Result Value Ref Range  Status   Specimen Description CSF  Final   Special Requests NONE  Final   Gram Stain RARE WBC SEEN NO ORGANISMS SEEN   Final   Culture PENDING  Incomplete   Report Status PENDING  Incomplete    IMAGING: Dg Lumbar Puncture Fluoro Guide  05/10/2015   CLINICAL DATA:  Fever, body aches, headache  EXAM: DIAGNOSTIC LUMBAR PUNCTURE UNDER FLUOROSCOPIC GUIDANCE  FLUOROSCOPY TIME:  Radiation Exposure Index (as provided by the fluoroscopic device): 1.2 mGy  PROCEDURE: Informed consent was obtained from the patient prior to the procedure, including potential complications of headache, allergy, and pain. With the patient prone, the lower back was prepped with Betadine. 1% Lidocaine was used for local anesthesia. Lumbar puncture was performed at the L4-5 level using a 22 gauge needle with return of clear CSF. 10 ml of CSF were obtained for laboratory studies. The patient tolerated the procedure well and there were no apparent complications.  IMPRESSION: Successful fluoroscopic guided diagnostic lumbar puncture.   Electronically Signed   By: Kathreen Devoid   On: 05/10/2015 09:21    Assessment:   Sarah Holland is a 24 y.o. female admitted 5/8 with relatively acute onset of LBP followed by pain down legs then HA, and , chills over a few hours. Came to ED and had temp 103. She reports feeling normal prior to yesterday. She was a work at [employer] when pain began.  No recent URI, UTI sxs. No cough, sinus congestion. Has had a little sore throat and R sided cervical pain this AM but none prior.  No diarrhea, no rash, joint pain or swelling.  No recent procedures, skin infections, dental visits, etc. No tick bites, no outdoor  exposures. Sexually active with one partner and no hx of STD - no genital complaints. Lives with her 2 children aged 26,5. Neither have been ill. No other sick contacts  Her LP shows no evidence of meningitis with nml prot glucose and only 6 wbc.  UA neg, wbc nml.   Likely viral infection with back pain related to her fevers and feeling poorly but could have infection of L spine if she had any recent bacteremia although really no recent procedures, infections etc that would suggest this.  She denies any outdoor activity or tick bites.   Recommendations Continue ceftraixone pending culture results - dose decreased to 1 gm q 24 Stop vancomycin as no evidence MRSA Will start doxy empirically for RMSF but unlikely. Check CXR and LS spine film.  If back pain persists will need MRI of LS spine.  Thank you very much for allowing me to participate in the care of this patient. Please call with questions.   Cheral Marker. Ola Spurr, MD

## 2015-05-10 NOTE — Progress Notes (Signed)
ANTIBIOTIC CONSULT NOTE - INITIAL  Pharmacy Consult for Vancomycin/Ceftriaxone Indication: Meningitis  Allergies  Allergen Reactions  . Oxycodone Other (See Comments)    Reaction: unknown    Patient Measurements: Height: 5\' 5"  (165.1 cm) Weight: 198 lb (89.812 kg) IBW/kg (Calculated) : 57 Adjusted Body Weight: 70.1 kg  Vital Signs: Temp: 103 F (39.4 C) (06/08 2032) Temp Source: Oral (06/08 2032) BP: 129/78 mmHg (06/09 0030) Pulse Rate: 86 (06/09 0030) Intake/Output from previous day:   Intake/Output from this shift:    Labs:  Recent Labs  05/09/15 2043  WBC 8.4  HGB 12.6  PLT 254  CREATININE 0.92   Estimated Creatinine Clearance: 103.4 mL/min (by C-G formula based on Cr of 0.92). No results for input(s): VANCOTROUGH, VANCOPEAK, VANCORANDOM, GENTTROUGH, GENTPEAK, GENTRANDOM, TOBRATROUGH, TOBRAPEAK, TOBRARND, AMIKACINPEAK, AMIKACINTROU, AMIKACIN in the last 72 hours.   Kinetics:   Ke: 0.09 Vd:49.1   Microbiology: No results found for this or any previous visit (from the past 720 hour(s)).  Medical History: Past Medical History  Diagnosis Date  . Medical history non-contributory     Medications:  Scheduled:  . cefTRIAXone (ROCEPHIN)  IV  2 g Intravenous Q12H  . enoxaparin (LOVENOX) injection  40 mg Subcutaneous Q24H  . vancomycin  1,000 mg Intravenous Q8H   Infusions:   PRN: acetaminophen, HYDROmorphone (DILAUDID) injection Anti-infectives    Start     Dose/Rate Route Frequency Ordered Stop   05/10/15 1300  cefTRIAXone (ROCEPHIN) 2 g in dextrose 5 % 50 mL IVPB - Premix     2 g 100 mL/hr over 30 Minutes Intravenous Every 12 hours 05/10/15 0127     05/10/15 0600  vancomycin (VANCOCIN) IVPB 1000 mg/200 mL premix     1,000 mg 200 mL/hr over 60 Minutes Intravenous Every 8 hours 05/10/15 0127     05/09/15 2300  vancomycin (VANCOCIN) IVPB 1000 mg/200 mL premix     1,000 mg 200 mL/hr over 60 Minutes Intravenous  Once 05/09/15 2252 05/10/15 0051   05/09/15 2300  cefTRIAXone (ROCEPHIN) 2 g in dextrose 5 % 50 mL IVPB - Premix     2 g 100 mL/hr over 30 Minutes Intravenous  Once 05/09/15 2252 05/10/15 0119     Assessment: Patient with possible meningitis to begin empiric abx.   Goal of Therapy:  Vancomycin trough level 15-20 mcg/ml  Plan:  1. Ceftriaxone 2 g iv q 12 h.   2. Vancomycin 1000 mg iv q 8 h with stacked dosing and a trough with the 5th dose. Will need to follow closely due to risk of accumulation.   Luisa Hart D 05/10/2015,1:28 AM

## 2015-05-10 NOTE — Progress Notes (Signed)
[facility] [place] at [facility]                                                                                                                                                                                            Patient Demographics   Sarah Holland, is a 24 y.o. female, DOB - Oct 12, 1991, VOH:[id number]  Admit date - 05/09/2015   Admitting Physician Crissie Figures, MD  Outpatient Primary MD for the patient is PROVIDER NOT IN SYSTEM   LOS - 0  Subjective: Patient admitted with headache and fever, still continues to have headache. Patient also complains of pain in her lower back. Denies any significant photophobia     Review of Systems:   CONSTITUTIONAL: No documented positive fever. No fatigue, weakness. No weight gain, no weight loss.  EYES: No blurry or double vision.  ENT: No tinnitus. No postnasal drip. No redness of the oropharynx.  RESPIRATORY: No cough, no wheeze, no hemoptysis. No dyspnea.  CARDIOVASCULAR: No chest pain. No orthopnea. No palpitations. No syncope.  GASTROINTESTINAL: No nausea, no vomiting or diarrhea. No abdominal pain. No melena or hematochezia.  GENITOURINARY: No dysuria or hematuria.  ENDOCRINE: No polyuria or nocturia. No heat or cold intolerance.  HEMATOLOGY: No anemia. No bruising. No bleeding.  INTEGUMENTARY: No rashes. No lesions.  MUSCULOSKELETAL: No arthritis. No swelling. No gout.  NEUROLOGIC: No numbness, tingling, or ataxia. No seizure-type activity. Positive headache PSYCHIATRIC: No anxiety. No insomnia. No ADD.    Vitals:   Filed Vitals:   05/10/15 0130 05/10/15 0500 05/10/15 0503 05/10/15 0914  BP: 129/66  108/54 132/84  Pulse: 94  105 88  Temp: 98.5 F (36.9 C)  99.1 F (37.3 C) 101.3 F (38.5 C)  TempSrc: Oral  Oral Oral  Resp: 18  18 18   Height:      Weight:  89.4 kg (197 lb 1.5 oz)    SpO2: 100%  99% 98%    Wt Readings from Last 3 Encounters:  05/10/15 89.4 kg (197 lb 1.5 oz)      Intake/Output Summary (Last 24 hours) at 05/10/15 1239 Last data filed at 05/10/15 0542  Gross per 24 hour  Intake    740 ml  Output      0 ml  Net    740 ml    Physical Exam:   GENERAL: Pleasant-appearing in no apparent distress.  HEAD, EYES, EARS, NOSE AND THROAT: Atraumatic, normocephalic. Extraocular muscles are intact. Pupils equal and reactive to light. Sclerae anicteric. No conjunctival injection. No oro-pharyngeal erythema.  NECK: Supple. There is no jugular venous distention. No bruits, no lymphadenopathy,  no thyromegaly.  HEART: Regular rate and rhythm, tachycardic. No murmurs, no rubs, no clicks.  LUNGS: Clear to auscultation bilaterally. No rales or rhonchi. No wheezes.  ABDOMEN: Soft, flat, nontender, nondistended. Has good bowel sounds. No hepatosplenomegaly appreciated.  EXTREMITIES: No evidence of any cyanosis, clubbing, or peripheral edema.  +2 pedal and radial pulses bilaterally.  NEUROLOGIC: The patient is alert, awake, and oriented x3 with no focal motor or sensory deficits appreciated bilaterally.  SKIN: Moist and warm with no rashes appreciated.  Psych: Not anxious, depressed LN: No inguinal LN enlargement    Antibiotics   Anti-infectives    Start     Dose/Rate Route Frequency Ordered Stop   05/10/15 1300  cefTRIAXone (ROCEPHIN) 2 g in dextrose 5 % 50 mL IVPB - Premix     2 g 100 mL/hr over 30 Minutes Intravenous Every 12 hours 05/10/15 0127     05/10/15 0600  vancomycin (VANCOCIN) IVPB 1000 mg/200 mL premix     1,000 mg 200 mL/hr over 60 Minutes Intravenous Every 8 hours 05/10/15 0127     05/09/15 2300  vancomycin (VANCOCIN) IVPB 1000 mg/200 mL premix     1,000 mg 200 mL/hr over 60 Minutes Intravenous  Once 05/09/15 2252 05/10/15 0051   05/09/15 2300  cefTRIAXone (ROCEPHIN) 2 g in dextrose 5 % 50 mL IVPB - Premix     2 g 100 mL/hr over 30 Minutes Intravenous  Once 05/09/15 2252 05/10/15 0119      Medications   Scheduled Meds: . cefTRIAXone  (ROCEPHIN)  IV  2 g Intravenous Q12H  . enoxaparin (LOVENOX) injection  40 mg Subcutaneous Q24H  . vancomycin  1,000 mg Intravenous Q8H   Continuous Infusions:  PRN Meds:.acetaminophen, HYDROmorphone (DILAUDID) injection   Data Review:   Micro Results Recent Results (from the past 240 hour(s))  Blood culture (routine x 2)     Status: None (Preliminary result)   Collection Time: 05/09/15 10:35 PM  Result Value Ref Range Status   Specimen Description BLOOD  Final   Special Requests NONE  Final   Culture NO GROWTH < 12 HOURS  Final   Report Status PENDING  Incomplete  Blood culture (routine x 2)     Status: None (Preliminary result)   Collection Time: 05/09/15 10:35 PM  Result Value Ref Range Status   Specimen Description BLOOD  Final   Special Requests NONE  Final   Culture NO GROWTH < 12 HOURS  Final   Report Status PENDING  Incomplete  CSF culture     Status: None (Preliminary result)   Collection Time: 05/10/15  8:50 AM  Result Value Ref Range Status   Specimen Description CSF  Final   Special Requests NONE  Final   Gram Stain RARE WBC SEEN NO ORGANISMS SEEN   Final   Culture PENDING  Incomplete   Report Status PENDING  Incomplete    Radiology Reports Dg Lumbar Puncture Fluoro Guide  05/10/2015   CLINICAL DATA:  Fever, body aches, headache  EXAM: DIAGNOSTIC LUMBAR PUNCTURE UNDER FLUOROSCOPIC GUIDANCE  FLUOROSCOPY TIME:  Radiation Exposure Index (as provided by the fluoroscopic device): 1.2 mGy  PROCEDURE: Informed consent was obtained from the patient prior to the procedure, including potential complications of headache, allergy, and pain. With the patient prone, the lower back was prepped with Betadine. 1% Lidocaine was used for local anesthesia. Lumbar puncture was performed at the L4-5 level using a 22 gauge needle with return of clear CSF. 10 ml of CSF  were obtained for laboratory studies. The patient tolerated the procedure well and there were no apparent complications.   IMPRESSION: Successful fluoroscopic guided diagnostic lumbar puncture.   Electronically Signed   By: Elige Ko   On: 05/10/2015 09:21     CBC  Recent Labs Lab 05/09/15 2043 05/10/15 0539  WBC 8.4 8.1  HGB 12.6 11.5*  HCT 40.2 36.1  PLT 254 224  MCV 80.4 80.5  MCH 25.2* 25.5*  MCHC 31.3* 31.7*  RDW 13.7 13.9  LYMPHSABS 1.0  --   MONOABS 0.6  --   EOSABS 0.1  --   BASOSABS 0.0  --     Chemistries   Recent Labs Lab 05/09/15 2043  NA 138  K 3.9  CL 105  CO2 25  GLUCOSE 113*  BUN 13  CREATININE 0.92  CALCIUM 9.6  AST 17  ALT 21  ALKPHOS 44  BILITOT 0.6   ------------------------------------------------------------------------------------------------------------------ estimated creatinine clearance is 103.3 mL/min (by C-G formula based on Cr of 0.92). ------------------------------------------------------------------------------------------------------------------ No results for input(s): HGBA1C in the last 72 hours. ------------------------------------------------------------------------------------------------------------------ No results for input(s): CHOL, HDL, LDLCALC, TRIG, CHOLHDL, LDLDIRECT in the last 72 hours. ------------------------------------------------------------------------------------------------------------------ No results for input(s): TSH, T4TOTAL, T3FREE, THYROIDAB in the last 72 hours.  Invalid input(s): FREET3 ------------------------------------------------------------------------------------------------------------------ No results for input(s): VITAMINB12, FOLATE, FERRITIN, TIBC, IRON, RETICCTPCT in the last 72 hours.  Coagulation profile  Recent Labs Lab 05/09/15 2043  INR 1.00    No results for input(s): DDIMER in the last 72 hours.  Cardiac Enzymes No results for input(s): CKMB, TROPONINI, MYOGLOBIN in the last 168 hours.  Invalid input(s):  CK ------------------------------------------------------------------------------------------------------------------ Invalid input(s): POCBNP    Assessment & Plan   Principal Problem:   SIRS (systemic inflammatory response syndrome) : Status post lumbar guided lumbar puncture. Full analysis of the lumbar punctures currently pending. Continue IV anabiotic's, check HIV, ask ID to see the patient.      Code Status Orders        Start     Ordered   05/10/15 0124  Full code   Continuous     05/10/15 0123              DVT Prophylaxis  Lovenox  Lab Results  Component Value Date   PLT 224 05/10/2015     Time Spent in minutes  45 minutes    Auburn Bilberry M.D on 05/10/2015 at 12:39 PM  Between 7am to 6pm - Pager - [phone]  After 6pm go to [email or web address] - password [email or web address] [facility]  [facility] [place] [facility]   Office  [phone]

## 2015-05-11 ENCOUNTER — Inpatient Hospital Stay: Payer: Self-pay

## 2015-05-11 LAB — BASIC METABOLIC PANEL
ANION GAP: 5 (ref 5–15)
BUN: 9 mg/dL (ref 6–20)
CALCIUM: 9.1 mg/dL (ref 8.9–10.3)
CO2: 26 mmol/L (ref 22–32)
Chloride: 110 mmol/L (ref 101–111)
Creatinine, Ser: 0.76 mg/dL (ref 0.44–1.00)
GFR calc Af Amer: >60 mL/min (ref >60)
Glucose, Bld: 127 mg/dL — ABNORMAL HIGH (ref 65–99)
Potassium: 4.1 mmol/L (ref 3.5–5.1)
Sodium: 141 mmol/L (ref 135–145)

## 2015-05-11 LAB — CBC
HEMATOCRIT: 37.3 % (ref 35.0–47.0)
HEMOGLOBIN: 11.6 g/dL — AB (ref 12.0–16.0)
MCH: 25.1 pg — ABNORMAL LOW (ref 26.0–34.0)
MCHC: 31 g/dL — ABNORMAL LOW (ref 32.0–36.0)
MCV: 80.9 fL (ref 80.0–100.0)
PLATELETS: 216 10*3/uL (ref 150–440)
RBC: 4.61 MIL/uL (ref 3.80–5.20)
RDW: 13.9 % (ref 11.5–14.5)
WBC: 7.7 10*3/uL (ref 3.6–11.0)

## 2015-05-11 LAB — HERPES SIMPLEX VIRUS(HSV) DNA BY PCR
HSV 1 DNA: NEGATIVE
HSV 2 DNA: NEGATIVE

## 2015-05-11 MED ORDER — GADOBENATE DIMEGLUMINE 529 MG/ML IV SOLN
19.0000 mL | Freq: Once | INTRAVENOUS | Status: AC | PRN
  Administered 2015-05-11: 19 mL via INTRAVENOUS

## 2015-05-11 MED ORDER — IBUPROFEN 200 MG PO TABS: 200.0000 mg | ORAL_TABLET | Freq: Four times a day (QID) | ORAL | Status: DC | PRN

## 2015-05-11 MED ORDER — AMOXICILLIN-POT CLAVULANATE 875-125 MG PO TABS: 1.0000 | ORAL_TABLET | Freq: Two times a day (BID) | ORAL | Status: AC

## 2015-05-11 MED ORDER — HYDROCODONE-ACETAMINOPHEN 2.5-500 MG PO TABS: 1.0000 | ORAL_TABLET | Freq: Four times a day (QID) | ORAL | Status: DC | PRN

## 2015-05-11 NOTE — Discharge Instructions (Signed)
°  DIET:  Regular diet  DISCHARGE CONDITION:  Good  ACTIVITY:  Activity as tolerated  OXYGEN:  Home Oxygen: No.   Oxygen Delivery: room air  DISCHARGE LOCATION:  home    ADDITIONAL DISCHARGE INSTRUCTION: If feeling ok can return to work on monday   If you experience worsening of your admission symptoms, develop shortness of breath, life threatening emergency, suicidal or homicidal thoughts you must seek medical attention immediately by calling 911 or calling your MD immediately  if symptoms less severe.  You Must read complete instructions/literature along with all the possible adverse reactions/side effects for all the Medicines you take and that have been prescribed to you. Take any new Medicines after you have completely understood and accpet all the possible adverse reactions/side effects.   Please note  You were cared for by a hospitalist during your hospital stay. If you have any questions about your discharge medications or the care you received while you were in the hospital after you are discharged, you can call the unit and asked to speak with the hospitalist on call if the hospitalist that took care of you is not available. Once you are discharged, your primary care physician will handle any further medical issues. Please note that NO REFILLS for any discharge medications will be authorized once you are discharged, as it is imperative that you return to your primary care physician (or establish a relationship with a primary care physician if you do not have one) for your aftercare needs so that they can reassess your need for medications and monitor your lab values.

## 2015-05-11 NOTE — Progress Notes (Signed)
Discharge instructions reviewed with the pt.   Options given to the pt on how to go about getting her RX filled.  RX given to the pt for augmentin, vicodin, and ibuprofen. Pt sent out via wheelchair to waiting car

## 2015-05-11 NOTE — Outcomes Assessment (Signed)
VSS, patient A+O with no signs of distress. Pain is controlled with current medications.  Up to BR voiding without difficulty. Appears to have slept well.

## 2015-05-11 NOTE — Progress Notes (Signed)
ANTIBIOTIC CONSULT NOTE - INITIAL  Pharmacy Consult for Ceftriaxone Indication: Meningitis  Allergies  Allergen Reactions  . Oxycodone Other (See Comments)    Reaction: unknown    Patient Measurements: Height: 5\' 5"  (165.1 cm) Weight: 203 lb 1.6 oz (92.126 kg) IBW/kg (Calculated) : 57 Adjusted Body Weight: 70.1 kg  Vital Signs: Temp: 98.2 F (36.8 C) (06/10 0346) Temp Source: Oral (06/10 0346) BP: 109/66 mmHg (06/10 0346) Pulse Rate: 63 (06/10 0346) Intake/Output from previous day: 06/09 0701 - 06/10 0700 In: 480 [P.O.:480] Out: 1 [Urine:1] Intake/Output from this shift:    Labs:  Recent Labs  05/09/15 2043 05/10/15 0539 05/11/15 0453  WBC 8.4 8.1 7.7  HGB 12.6 11.5* 11.6*  PLT 254 224 216  CREATININE 0.92  --  0.76   Estimated Creatinine Clearance: 120.5 mL/min (by C-G formula based on Cr of 0.76). No results for input(s): VANCOTROUGH, VANCOPEAK, VANCORANDOM, GENTTROUGH, GENTPEAK, GENTRANDOM, TOBRATROUGH, TOBRAPEAK, TOBRARND, AMIKACINPEAK, AMIKACINTROU, AMIKACIN in the last 72 hours.   Kinetics:   Ke: 0.09 Vd:49.1   Microbiology: Recent Results (from the past 720 hour(s))  Blood culture (routine x 2)     Status: None (Preliminary result)   Collection Time: 05/09/15 10:35 PM  Result Value Ref Range Status   Specimen Description BLOOD  Final   Special Requests NONE  Final   Culture NO GROWTH < 12 HOURS  Final   Report Status PENDING  Incomplete  Blood culture (routine x 2)     Status: None (Preliminary result)   Collection Time: 05/09/15 10:35 PM  Result Value Ref Range Status   Specimen Description BLOOD  Final   Special Requests NONE  Final   Culture NO GROWTH < 12 HOURS  Final   Report Status PENDING  Incomplete  CSF culture     Status: None (Preliminary result)   Collection Time: 05/10/15  8:50 AM  Result Value Ref Range Status   Specimen Description CSF  Final   Special Requests NONE  Final   Gram Stain RARE WBC SEEN NO ORGANISMS SEEN  Final   Culture PENDING  Incomplete   Report Status PENDING  Incomplete    Medical History: Past Medical History  Diagnosis Date  . Medical history non-contributory     Medications:  Scheduled:  . cefTRIAXone (ROCEPHIN)  IV  1 g Intravenous Q24H  . doxycycline  100 mg Oral Q12H  . enoxaparin (LOVENOX) injection  40 mg Subcutaneous Q24H   Infusions:   PRN: acetaminophen, HYDROmorphone (DILAUDID) injection Anti-infectives    Start     Dose/Rate Route Frequency Ordered Stop   05/11/15 1000  cefTRIAXone (ROCEPHIN) 1 g in dextrose 5 % 50 mL IVPB - Premix     1 g 100 mL/hr over 30 Minutes Intravenous Every 24 hours 05/10/15 1315     05/10/15 1330  doxycycline (VIBRA-TABS) tablet 100 mg     100 mg Oral Every 12 hours 05/10/15 1315     05/10/15 1300  cefTRIAXone (ROCEPHIN) 2 g in dextrose 5 % 50 mL IVPB - Premix  Status:  Discontinued     2 g 100 mL/hr over 30 Minutes Intravenous Every 12 hours 05/10/15 0127 05/10/15 1315   05/10/15 0600  vancomycin (VANCOCIN) IVPB 1000 mg/200 mL premix  Status:  Discontinued     1,000 mg 200 mL/hr over 60 Minutes Intravenous Every 8 hours 05/10/15 0127 05/10/15 1315   05/09/15 2300  vancomycin (VANCOCIN) IVPB 1000 mg/200 mL premix     1,000 mg 200  mL/hr over 60 Minutes Intravenous  Once 05/09/15 2252 05/10/15 0051   05/09/15 2300  cefTRIAXone (ROCEPHIN) 2 g in dextrose 5 % 50 mL IVPB - Premix     2 g 100 mL/hr over 30 Minutes Intravenous  Once 05/09/15 2252 05/10/15 0119     Assessment: Patient with possible meningitis to begin empiric abx. LP no evidence of meningitis.  Plan:  Ceftriaxone 2 g iv q 12 h already reduced to 1 g iv q 24 h.  Elvera Maria, PharmD  05/11/2015,7:50 AM

## 2015-05-11 NOTE — Discharge Summary (Addendum)
Sarah Sarah, 23 y.o., DOB 05/08/1991, MRN [id number]. Admission date: 05/09/2015 Discharge Date 05/11/2015 Primary MD PROVIDER NOT IN SYSTEM Admitting Physician Crissie Figures, MD  Admission Diagnosis  Myalgia [M79.1] Fever chills [R50.9] Headache [R51]  Discharge Diagnosis   Principal Problem:   SIRS (systemic inflammatory response syndrome) due to pharyngitis Active Problems:   Fever Pharyngitis  Hospital Sarah Sarah is a 24 y.o. female with no significant past medical history presents to the emergency room with the complaint of acute onset of back pain with associated generalized body aches, chills, found to be febrile with temperature 10 60F on arrival to the ED. patient did complaint of headache and back pain. There was a lumbar puncture attempted in the emergency room but unsuccessful. Therefore she was admitted to the hospital further evaluation. Her evaluation included a lumbar puncture under fluoroscopy which showed no evidence of meningitis. Her sedimentation rate was 7. She did have pharyngitis and exudate on her posterior throat. She was seen by Dr. Sampson Goon and was started on Augmentin. Patient currently is afebrile. She did complaint of lower back pain therefore had an MRI which failed to show any evidence of abscess or infection. She needs supportive care. Her HIV test was negative.           Consults  ID  Significant Tests:  See full reports for all details    Dg Chest 2 View  05/10/2015   CLINICAL DATA:  24 year old female with lumbar back pain since yesterday (not otherwise specified at the time of this report). Initial encounter.  EXAM: CHEST  2 VIEW  COMPARISON:  Chest radiographs 10/11/2010  FINDINGS: Lung volumes remain normal. Normal cardiac size and mediastinal contours. Visualized tracheal air column is within normal limits. The lungs remain clear. No pneumothorax or effusion. Minimal levo convex thoracic spine curvature is stable. No other  osseous abnormality.  IMPRESSION: Stable and negative, no acute cardiopulmonary abnormality.   Electronically Signed   By: Odessa Fleming M.D.   On: 05/10/2015 14:43   Dg Lumbar Spine 2-3 Views  05/10/2015   CLINICAL DATA:  24 year old female with low back pain since yesterday. No history trauma provided. Initial encounter.  EXAM: LUMBAR SPINE - 2-3 VIEW  COMPARISON:  06/27/2014 CT.  FINDINGS: Transitional vertebra at the lumbosacral junction with rudimentary disc.  Otherwise no evidence of disc space narrowing.  No evidence malalignment.  Spur anterior aspect lower thoracic disc space.  IMPRESSION: Transitional vertebra at the lumbosacral junction with rudimentary disc.  Otherwise no evidence of disc space narrowing.  No evidence malalignment.   Electronically Signed   By: Lacy Duverney M.D.   On: 05/10/2015 14:46   Mr Lumbar Spine W Wo Contrast  05/11/2015   CLINICAL DATA:  Low back pain with bilateral leg pain since Wednesday. No known injury. Pain goes down both legs and into the upper back.  EXAM: MRI LUMBAR SPINE WITHOUT AND WITH CONTRAST  TECHNIQUE: Multiplanar and multiecho pulse sequences of the lumbar spine were obtained without and with intravenous contrast.  CONTRAST:  19mL MULTIHANCE GADOBENATE DIMEGLUMINE 529 MG/ML IV SOLN  COMPARISON:  CT abdomen/ pelvis 06/29/2007 and 06/27/2014  FINDINGS: The vertebral bodies of the lumbar spine are normal in size. The vertebral bodies of the lumbar spine are normal in alignment. There is normal bone marrow signal demonstrated throughout the vertebra. The intervertebral disc spaces are well-maintained. There is abnormal articulation of the bilateral L5 transverse processes with the sacrum similar in appearance to the prior  CT abdomen exam of 06/29/2007.  The spinal cord is normal in signal and contour. The cord terminates normally at T12 . The nerve roots of the cauda equina and the filum terminale are normal. There are no areas of abnormal enhancement.  The  visualized portions of the SI joints are unremarkable.  The imaged intra-abdominal contents are unremarkable.  T12-L1: No significant disc bulge. No evidence of neural foraminal stenosis. No central canal stenosis.  L1-L2: No significant disc bulge. No evidence of neural foraminal stenosis. No central canal stenosis.  L2-L3: No significant disc bulge. No evidence of neural foraminal stenosis. No central canal stenosis.  L3-L4: No significant disc bulge. No evidence of neural foraminal stenosis. No central canal stenosis.  L4-L5: No significant disc bulge. No evidence of neural foraminal stenosis. No central canal stenosis. Mild left facet arthropathy. Mild right facet arthropathy with synovitis.  L5-S1: No significant disc bulge. No evidence of neural foraminal stenosis. No central canal stenosis.  IMPRESSION: 1. No evidence of discitis of the lumbar spine. 2. No lumbar spine disc protrusion, foraminal stenosis or central canal stenosis.   Electronically Signed   By: Elige Ko   On: 05/11/2015 10:39   Dg Lumbar Puncture Fluoro Guide  05/10/2015   CLINICAL DATA:  Fever, body aches, headache  EXAM: DIAGNOSTIC LUMBAR PUNCTURE UNDER FLUOROSCOPIC GUIDANCE  FLUOROSCOPY TIME:  Radiation Exposure Index (as provided by the fluoroscopic device): 1.2 mGy  PROCEDURE: Informed consent was obtained from the patient prior to the procedure, including potential complications of headache, allergy, and pain. With the patient prone, the lower back was prepped with Betadine. 1% Lidocaine was used for local anesthesia. Lumbar puncture was performed at the L4-5 level using a 22 gauge needle with return of clear CSF. 10 ml of CSF were obtained for laboratory studies. The patient tolerated the procedure well and there were no apparent complications.  IMPRESSION: Successful fluoroscopic guided diagnostic lumbar puncture.   Electronically Signed   By: Elige Ko   On: 05/10/2015 09:21       Today   Subjective:   Sarah Sarah   feels much better her headache is mostly resolved. Has some lower back pain.  Objective:   Blood pressure 125/76, pulse 63, temperature 98 F (36.7 C), temperature source Oral, resp. rate 16, height 5\' 5"  (1.651 m), weight 92.126 kg (203 lb 1.6 oz), SpO2 100 %.  .  Intake/Output Summary (Last 24 hours) at 05/11/15 1136 Last data filed at 05/11/15 0745  Gross per 24 hour  Intake    480 ml  Output      1 ml  Net    479 ml    Exam VITAL SIGNS: Blood pressure 125/76, pulse 63, temperature 98 F (36.7 C), temperature source Oral, resp. rate 16, height 5\' 5"  (1.651 m), weight 92.126 kg (203 lb 1.6 oz), SpO2 100 %.  GENERAL:  24 y.o.-year-old patient lying in the bed with no acute distress.  EYES: Pupils equal, round, reactive to light and accommodation. No scleral icterus. Extraocular muscles intact.  HEENT: Head atraumatic, normocephalic. Oropharynx and nasopharynx clear.  NECK:  Supple, no jugular venous distention. No thyroid enlargement, no tenderness.  LUNGS: Normal breath sounds bilaterally, no wheezing, rales,rhonchi or crepitation. No use of accessory muscles of respiration.  CARDIOVASCULAR: S1, S2 normal. No murmurs, rubs, or gallops.  ABDOMEN: Soft, nontender, nondistended. Bowel sounds present. No organomegaly or mass.  EXTREMITIES: No pedal edema, cyanosis, or clubbing.  NEUROLOGIC: Cranial nerves II through XII are  intact. Muscle strength 5/5 in all extremities. Sensation intact. Gait not checked.  PSYCHIATRIC: The patient is alert and oriented x 3.  SKIN: No obvious rash, lesion, or ulcer.   Data Review     CBC w Diff: Lab Results  Component Value Date   WBC 7.7 05/11/2015   WBC 5.1 06/27/2014   HGB 11.6* 05/11/2015   HGB 12.7 06/27/2014   HCT 37.3 05/11/2015   HCT 40.0 06/27/2014   PLT 216 05/11/2015   PLT 237 06/27/2014   LYMPHOPCT 12 05/09/2015   LYMPHOPCT 46.6 06/27/2014   MONOPCT 7 05/09/2015   MONOPCT 7.0 06/27/2014   EOSPCT 2 05/09/2015   EOSPCT 6.8  06/27/2014   BASOPCT 1 05/09/2015   BASOPCT 1.2 06/27/2014   CMP: Lab Results  Component Value Date   NA 141 05/11/2015   NA 141 06/27/2014   K 4.1 05/11/2015   K 4.1 06/27/2014   CL 110 05/11/2015   CL 108* 06/27/2014   CO2 26 05/11/2015   CO2 24 06/27/2014   BUN 9 05/11/2015   BUN 14 06/27/2014   CREATININE 0.76 05/11/2015   CREATININE 0.81 06/27/2014   PROT 7.9 05/09/2015   PROT 7.9 06/27/2014   ALBUMIN 4.8 05/09/2015   ALBUMIN 4.6 06/27/2014   BILITOT 0.6 05/09/2015   ALKPHOS 44 05/09/2015   ALKPHOS 60 06/27/2014   AST 17 05/09/2015   AST 15 06/27/2014   ALT 21 05/09/2015   ALT 33 06/27/2014  .  Micro Results Recent Results (from the past 240 hour(s))  Blood culture (routine x 2)     Status: None (Preliminary result)   Collection Time: 05/09/15 10:35 PM  Result Value Ref Range Status   Specimen Description BLOOD  Final   Special Requests NONE  Final   Culture NO GROWTH 2 DAYS  Final   Report Status PENDING  Incomplete  Blood culture (routine x 2)     Status: None (Preliminary result)   Collection Time: 05/09/15 10:35 PM  Result Value Ref Range Status   Specimen Description BLOOD  Final   Special Requests NONE  Final   Culture NO GROWTH 2 DAYS  Final   Report Status PENDING  Incomplete  CSF culture     Status: None (Preliminary result)   Collection Time: 05/10/15  8:50 AM  Result Value Ref Range Status   Specimen Description CSF  Final   Special Requests NONE  Final   Gram Stain RARE WBC SEEN NO ORGANISMS SEEN   Final   Culture NO GROWTH 1 DAY  Final   Report Status PENDING  Incomplete        Code Status Orders        Start     Ordered   05/10/15 0124  Full code   Continuous     05/10/15 0123            Discharge Medications     Medication List    TAKE these medications        amoxicillin-clavulanate 875-125 MG per tablet  Commonly known as:  AUGMENTIN  Take 1 tablet by mouth 2 (two) times daily.     HYDROcodone-acetaminophen  2.5-500 MG per tablet  Commonly known as:  VICODIN  Take 1 tablet by mouth every 6 (six) hours as needed for pain.     ibuprofen 200 MG tablet  Commonly known as:  MOTRIN IB  Take 1 tablet (200 mg total) by mouth every 6 (six) hours as needed.  Total Time in preparing paper work, data evaluation and todays exam - 35 minutes  Auburn Bilberry M.D on 05/11/2015 at 11:36 AM  [facility]   Office  [phone]

## 2015-05-11 NOTE — Progress Notes (Signed)
Swab collected by me was taken by me to the ER where a rapid strept test was done and was negative.  Machine would not accept patients MR number.

## 2015-05-11 NOTE — Progress Notes (Signed)
Date of Admission:  05/09/2015     ID: Sarah Holland is a 24 y.o. female with  Fevers back pain  Principal Problem:   SIRS (systemic inflammatory response syndrome) Active Problems:   Fever  Subjective: Still back pain pretty severe, fevers resolved.  Medications:  . cefTRIAXone (ROCEPHIN)  IV  1 g Intravenous Q24H  . doxycycline  100 mg Oral Q12H  . enoxaparin (LOVENOX) injection  40 mg Subcutaneous Q24H    Objective: Vital signs in last 24 hours: Temp:  [98.2 F (36.8 C)-101.3 F (38.5 C)] 98.2 F (36.8 C) (06/10 0346) Pulse Rate:  [63-89] 63 (06/10 0346) Resp:  [18] 18 (06/10 0346) BP: (109-132)/(62-84) 109/66 mmHg (06/10 0346) SpO2:  [98 %-100 %] 100 % (06/10 0346) Weight:  [92.126 kg (203 lb 1.6 oz)] 92.126 kg (203 lb 1.6 oz) (06/10 0346) Constitutional: oriented to person, place, and time. appears well-developed and well-nourished. No distress.  HENT: [place]/AT, PERRLA, no scleral icterus Mouth/Throat: Oropharynx is clear and moist. tonsilar erythema, and thick exudate Cardiovascular: Normal rate, regular rhythm and normal heart sounds. Exam reveals no gallop and no friction rub.  No murmur heard.  Pulmonary/Chest: Effort normal and breath sounds normal. No respiratory distress. has no wheezes.  Neck very mild pain with forward flexion but supple and no nuchal rigidity Abdominal: Soft. Bowel sounds are normal. exhibits no distension. There is no tenderness.  Lymphadenopathy: mild shoddy L ant cervical adenopathy. No axillary adenopathy Neurological: alert and oriented to person, place, and time.  Skin: Skin is warm and dry. No rash noted. No erythema.  Psychiatric: a normal mood and affect. behavior is normal.  Back - ttp over L spine  Lab Results  Recent Labs  05/09/15 2043 05/10/15 0539 05/11/15 0453  WBC 8.4 8.1 7.7  HGB 12.6 11.5* 11.6*  HCT 40.2 36.1 37.3  NA 138  --  141  K 3.9  --  4.1  CL 105  --  110  CO2 25  --  26  BUN 13  --  9   CREATININE 0.92  --  0.76   Liver Panel  Recent Labs  05/09/15 2043  PROT 7.9  ALBUMIN 4.8  AST 17  ALT 21  ALKPHOS 44  BILITOT 0.6   Sedimentation Rate  Recent Labs  05/10/15 1238  ESRSEDRATE 7    Microbiology: Results for orders placed or performed during the hospital encounter of 05/09/15  Blood culture (routine x 2)     Status: None (Preliminary result)   Collection Time: 05/09/15 10:35 PM  Result Value Ref Range Status   Specimen Description BLOOD  Final   Special Requests NONE  Final   Culture NO GROWTH < 12 HOURS  Final   Report Status PENDING  Incomplete  Blood culture (routine x 2)     Status: None (Preliminary result)   Collection Time: 05/09/15 10:35 PM  Result Value Ref Range Status   Specimen Description BLOOD  Final   Special Requests NONE  Final   Culture NO GROWTH < 12 HOURS  Final   Report Status PENDING  Incomplete  CSF culture     Status: None (Preliminary result)   Collection Time: 05/10/15  8:50 AM  Result Value Ref Range Status   Specimen Description CSF  Final   Special Requests NONE  Final   Gram Stain RARE WBC SEEN NO ORGANISMS SEEN   Final   Culture PENDING  Incomplete   Report Status PENDING  Incomplete  Studies/Results: Dg Chest 2 View  05/10/2015   CLINICAL DATA:  24 year old female with lumbar back pain since yesterday (not otherwise specified at the time of this report). Initial encounter.  EXAM: CHEST  2 VIEW  COMPARISON:  Chest radiographs 10/11/2010  FINDINGS: Lung volumes remain normal. Normal cardiac size and mediastinal contours. Visualized tracheal air column is within normal limits. The lungs remain clear. No pneumothorax or effusion. Minimal levo convex thoracic spine curvature is stable. No other osseous abnormality.  IMPRESSION: Stable and negative, no acute cardiopulmonary abnormality.   Electronically Signed   By: Odessa Fleming M.D.   On: 05/10/2015 14:43   Dg Lumbar Spine 2-3 Views  05/10/2015   CLINICAL DATA:   24 year old female with low back pain since yesterday. No history trauma provided. Initial encounter.  EXAM: LUMBAR SPINE - 2-3 VIEW  COMPARISON:  06/27/2014 CT.  FINDINGS: Transitional vertebra at the lumbosacral junction with rudimentary disc.  Otherwise no evidence of disc space narrowing.  No evidence malalignment.  Spur anterior aspect lower thoracic disc space.  IMPRESSION: Transitional vertebra at the lumbosacral junction with rudimentary disc.  Otherwise no evidence of disc space narrowing.  No evidence malalignment.   Electronically Signed   By: Lacy Duverney M.D.   On: 05/10/2015 14:46   Dg Lumbar Puncture Fluoro Guide  05/10/2015   CLINICAL DATA:  Fever, body aches, headache  EXAM: DIAGNOSTIC LUMBAR PUNCTURE UNDER FLUOROSCOPIC GUIDANCE  FLUOROSCOPY TIME:  Radiation Exposure Index (as provided by the fluoroscopic device): 1.2 mGy  PROCEDURE: Informed consent was obtained from the patient prior to the procedure, including potential complications of headache, allergy, and pain. With the patient prone, the lower back was prepped with Betadine. 1% Lidocaine was used for local anesthesia. Lumbar puncture was performed at the L4-5 level using a 22 gauge needle with return of clear CSF. 10 ml of CSF were obtained for laboratory studies. The patient tolerated the procedure well and there were no apparent complications.  IMPRESSION: Successful fluoroscopic guided diagnostic lumbar puncture.   Electronically Signed   By: Elige Ko   On: 05/10/2015 09:21     Assessment/Plan: Sarah Holland is a 24 y.o. female admitted 5/8 with relatively acute onset of LBP followed by pain down legs then HA, and , chills over a few hours. Came to ED and had temp 103. She reports feeling normal prior to yesterday. She was a work at [employer] when pain began. No recent URI, UTI sxs. No cough, sinus congestion. Has had a little sore throat and R sided cervical pain this AM but none prior. No  diarrhea, no rash, joint pain or swelling. No recent procedures, skin infections, dental visits, etc. No tick bites, no outdoor exposures. Sexually active with one partner and no hx of STD - no genital complaints. Lives with her 2 children aged 1,5. Neither have been ill. No other sick contacts Her LP shows no evidence of meningitis with nml prot glucose and only 6 wbc. UA neg, wbc nml.  Currently still with back pain, throat now very erythematous and with exudate. CXR and LS spine neg  Recommendations Will check strep test, check MRI spine If MRI negative could dc home today or tomorrow on augmentin 875 bid for 10 days total abx since admit and total 5 days of oral doxy 100 bid Continue ceftraixone for now

## 2015-05-11 NOTE — Progress Notes (Signed)
  Sarah Holland was admitted to the Hospital on 05/09/2015 and Discharged  05/11/2015 and should be excused from work/school   for 4  days starting 05/09/2015 , may return to work/school without any restrictions. Able to return to work on 05/14/2015  Call Auburn Bilberry MD with questions.  Auburn Bilberry M.D on 05/11/2015,at 8:47 AM  [facility] [place] [facility]  [phone]

## 2015-05-13 LAB — CSF CULTURE

## 2015-05-13 LAB — CULTURE, GROUP A STREP (THRC)

## 2015-05-13 LAB — CSF CULTURE W GRAM STAIN: Culture: NO GROWTH

## 2015-05-13 LAB — MISC LABCORP TEST (SEND OUT): Labcorp test code: 8250

## 2015-05-14 LAB — CULTURE, BLOOD (ROUTINE X 2)
CULTURE: NO GROWTH
Culture: NO GROWTH

## 2015-05-18 LAB — ANAEROBIC CULTURE

## 2015-06-22 LAB — GRAM STAIN

## 2018-12-01 NOTE — L&D Delivery Note (Signed)
Obstetrical Delivery Note   Date of Delivery:   09/28/2019 Primary OB:   [facility] Gestational Age/EDD: [redacted]w[redacted]d (Dated by LMP) Antepartum complications: GDMA2 on insulin, not well controlled; gestational diabetes, obesity  Delivered By:   Dalia Heading, CNM  Delivery Type:   spontaneous vaginal delivery  Procedure Details:   Mother pushed to deliver a viable female infant. Nuchal cord x 1 reduced on perineum. Baby dried and then placed on mother's abdomen suctioned with bulb and given tactile stimulation. Taken to warmer and given O2 blow by for color. Color improved and he was crying. Baby then placed skin to skin on mother's chest. Spontaneous delivery of intact placenta and 3 vessel cord. No vaginal, cervical, or perineal lacerations seen. Minimal blood loss. Anesthesia:    epidural Intrapartum complications: Vriable decelerations, nuchal cord x 1 GBS:    negative Laceration:    none Episiotomy:    none Placenta:    Via active 3rd stage. To pathology: no Estimated Blood Loss:  200 ml EBL Baby:    Liveborn female, Apgars 7/9, weight 7#2oz    Dalia Heading, CNM

## 2018-12-03 ENCOUNTER — Emergency Department: Payer: 59

## 2018-12-03 ENCOUNTER — Encounter: Payer: Self-pay | Admitting: *Deleted

## 2018-12-03 ENCOUNTER — Emergency Department
Admission: EM | Admit: 2018-12-03 | Discharge: 2018-12-03 | Disposition: A | Discharge status: Home / Self Care | Payer: 59 | Attending: Emergency Medicine | Admitting: Emergency Medicine

## 2018-12-03 ENCOUNTER — Other Ambulatory Visit: Payer: Self-pay

## 2018-12-03 DIAGNOSIS — Y92410 Unspecified street and highway as the place of occurrence of the external cause: Secondary | ICD-10-CM | POA: Insufficient documentation

## 2018-12-03 DIAGNOSIS — Z87891 Personal history of nicotine dependence: Secondary | ICD-10-CM | POA: Diagnosis not present

## 2018-12-03 DIAGNOSIS — Y998 Other external cause status: Secondary | ICD-10-CM | POA: Insufficient documentation

## 2018-12-03 DIAGNOSIS — Y9389 Activity, other specified: Secondary | ICD-10-CM | POA: Diagnosis not present

## 2018-12-03 DIAGNOSIS — S39012A Strain of muscle, fascia and tendon of lower back, initial encounter: Secondary | ICD-10-CM | POA: Diagnosis not present

## 2018-12-03 DIAGNOSIS — S3992XA Unspecified injury of lower back, initial encounter: Secondary | ICD-10-CM | POA: Diagnosis present

## 2018-12-03 LAB — POCT PREGNANCY, URINE: PREG TEST UR: NEGATIVE

## 2018-12-03 MED ORDER — MELOXICAM 15 MG PO TABS: 15.0000 mg | ORAL_TABLET | Freq: Every day | ORAL | 0 refills | Status: DC

## 2018-12-03 MED ORDER — METHOCARBAMOL 500 MG PO TABS: 500.0000 mg | ORAL_TABLET | Freq: Four times a day (QID) | ORAL | 0 refills | Status: DC
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
## 2019-03-23 ENCOUNTER — Ambulatory Visit (INDEPENDENT_AMBULATORY_CARE_PROVIDER_SITE_OTHER): Payer: 59 | Admitting: Obstetrics & Gynecology

## 2019-03-23 ENCOUNTER — Other Ambulatory Visit (HOSPITAL_COMMUNITY)
Admission: RE | Admit: 2019-03-23 | Discharge: 2019-03-23 | Disposition: A | Discharge status: Home / Self Care | Payer: 59 | Source: Ambulatory Visit | Attending: Obstetrics & Gynecology | Admitting: Obstetrics & Gynecology

## 2019-03-23 ENCOUNTER — Other Ambulatory Visit: Payer: Self-pay

## 2019-03-23 ENCOUNTER — Encounter: Payer: Self-pay | Admitting: Obstetrics & Gynecology

## 2019-03-23 VITALS — BP 124/80 | Wt 223.0 lb

## 2019-03-23 DIAGNOSIS — Z3A11 11 weeks gestation of pregnancy: Secondary | ICD-10-CM

## 2019-03-23 DIAGNOSIS — Z3202 Encounter for pregnancy test, result negative: Secondary | ICD-10-CM

## 2019-03-23 DIAGNOSIS — Z3481 Encounter for supervision of other normal pregnancy, first trimester: Secondary | ICD-10-CM

## 2019-03-23 DIAGNOSIS — N926 Irregular menstruation, unspecified: Secondary | ICD-10-CM

## 2019-03-23 DIAGNOSIS — Z124 Encounter for screening for malignant neoplasm of cervix: Secondary | ICD-10-CM | POA: Insufficient documentation

## 2019-03-23 DIAGNOSIS — Z3490 Encounter for supervision of normal pregnancy, unspecified, unspecified trimester: Secondary | ICD-10-CM

## 2019-03-23 DIAGNOSIS — Z369 Encounter for antenatal screening, unspecified: Secondary | ICD-10-CM

## 2019-03-23 LAB — POCT URINE PREGNANCY: Preg Test, Ur: POSITIVE — AB

## 2019-03-23 MED ORDER — ONDANSETRON 4 MG PO TBDP: 4.0000 mg | ORAL_TABLET | Freq: Four times a day (QID) | ORAL | 0 refills | Status: DC | PRN

## 2019-03-23 NOTE — Patient Instructions (Signed)
First Trimester of Pregnancy The first trimester of pregnancy is from week 1 until the end of week 13 (months 1 through 3). A week after a sperm fertilizes an egg, the egg will implant on the wall of the uterus. This embryo will begin to develop into a baby. Genes from you and your partner will form the baby. The female genes will determine whether the baby will be a boy or a girl. At 6-8 weeks, the eyes and face will be formed, and the heartbeat can be seen on ultrasound. At the end of 12 weeks, all the baby's organs will be formed. Now that you are pregnant, you will want to do everything you can to have a healthy baby. Two of the most important things are to get good prenatal care and to follow your health care provider's instructions. Prenatal care is all the medical care you receive before the baby's birth. This care will help prevent, find, and treat any problems during the pregnancy and childbirth. Body changes during your first trimester Your body goes through many changes during pregnancy. The changes vary from woman to woman.  You may gain or lose a couple of pounds at first.  You may feel sick to your stomach (nauseous) and you may throw up (vomit). If the vomiting is uncontrollable, call your health care provider.  You may tire easily.  You may develop headaches that can be relieved by medicines. All medicines should be approved by your health care provider.  You may urinate more often. Painful urination may mean you have a bladder infection.  You may develop heartburn as a result of your pregnancy.  You may develop constipation because certain hormones are causing the muscles that push stool through your intestines to slow down.  You may develop hemorrhoids or swollen veins (varicose veins).  Your breasts may begin to grow larger and become tender. Your nipples may stick out more, and the tissue that surrounds them (areola) may become darker.  Your gums may bleed and may be  sensitive to brushing and flossing.  Dark spots or blotches (chloasma, mask of pregnancy) may develop on your face. This will likely fade after the baby is born.  Your menstrual periods will stop.  You may have a loss of appetite.  You may develop cravings for certain kinds of food.  You may have changes in your emotions from day to day, such as being excited to be pregnant or being concerned that something may go wrong with the pregnancy and baby.  You may have more vivid and strange dreams.  You may have changes in your hair. These can include thickening of your hair, rapid growth, and changes in texture. Some women also have hair loss during or after pregnancy, or hair that feels dry or thin. Your hair will most likely return to normal after your baby is born. What to expect at prenatal visits During a routine prenatal visit:  You will be weighed to make sure you and the baby are growing normally.  Your blood pressure will be taken.  Your abdomen will be measured to track your baby's growth.  The fetal heartbeat will be listened to between weeks 10 and 14 of your pregnancy.  Test results from any previous visits will be discussed. Your health care provider may ask you:  How you are feeling.  If you are feeling the baby move.  If you have had any abnormal symptoms, such as leaking fluid, bleeding, severe headaches, or abdominal  cramping.  If you are using any tobacco products, including cigarettes, chewing tobacco, and electronic cigarettes.  If you have any questions. Other tests that may be performed during your first trimester include:  Blood tests to find your blood type and to check for the presence of any previous infections. The tests will also be used to check for low iron levels (anemia) and protein on red blood cells (Rh antibodies). Depending on your risk factors, or if you previously had diabetes during pregnancy, you may have tests to check for high blood sugar  that affects pregnant women (gestational diabetes).  Urine tests to check for infections, diabetes, or protein in the urine.  An ultrasound to confirm the proper growth and development of the baby.  Fetal screens for spinal cord problems (spina bifida) and Down syndrome.  HIV (human immunodeficiency virus) testing. Routine prenatal testing includes screening for HIV, unless you choose not to have this test.  You may need other tests to make sure you and the baby are doing well. Follow these instructions at home: Medicines  Follow your health care provider's instructions regarding medicine use. Specific medicines may be either safe or unsafe to take during pregnancy.  Take a prenatal vitamin that contains at least 600 micrograms (mcg) of folic acid.  If you develop constipation, try taking a stool softener if your health care provider approves. Eating and drinking   Eat a balanced diet that includes fresh fruits and vegetables, whole grains, good sources of protein such as meat, eggs, or tofu, and low-fat dairy. Your health care provider will help you determine the amount of weight gain that is right for you.  Avoid raw meat and uncooked cheese. These carry germs that can cause birth defects in the baby.  Eating four or five small meals rather than three large meals a day may help relieve nausea and vomiting. If you start to feel nauseous, eating a few soda crackers can be helpful. Drinking liquids between meals, instead of during meals, also seems to help ease nausea and vomiting.  Limit foods that are high in fat and processed sugars, such as fried and sweet foods.  To prevent constipation: ? Eat foods that are high in fiber, such as fresh fruits and vegetables, whole grains, and beans. ? Drink enough fluid to keep your urine clear or pale yellow. Activity  Exercise only as directed by your health care provider. Most women can continue their usual exercise routine during  pregnancy. Try to exercise for 30 minutes at least 5 days a week. Exercising will help you: ? Control your weight. ? Stay in shape. ? Be prepared for labor and delivery.  Experiencing pain or cramping in the lower abdomen or lower back is a good sign that you should stop exercising. Check with your health care provider before continuing with normal exercises.  Try to avoid standing for long periods of time. Move your legs often if you must stand in one place for a long time.  Avoid heavy lifting.  Wear low-heeled shoes and practice good posture.  You may continue to have sex unless your health care provider tells you not to. Relieving pain and discomfort  Wear a good support bra to relieve breast tenderness.  Take warm sitz baths to soothe any pain or discomfort caused by hemorrhoids. Use hemorrhoid cream if your health care provider approves.  Rest with your legs elevated if you have leg cramps or low back pain.  If you develop varicose veins in  your legs, wear support hose. Elevate your feet for 15 minutes, 3-4 times a day. Limit salt in your diet. Prenatal care  Schedule your prenatal visits by the twelfth week of pregnancy. They are usually scheduled monthly at first, then more often in the last 2 months before delivery.  Write down your questions. Take them to your prenatal visits.  Keep all your prenatal visits as told by your health care provider. This is important. Safety  Wear your seat belt at all times when driving.  Make a list of emergency phone numbers, including numbers for family, friends, the hospital, and police and fire departments. General instructions  Ask your health care provider for a referral to a local prenatal education class. Begin classes no later than the beginning of month 6 of your pregnancy.  Ask for help if you have counseling or nutritional needs during pregnancy. Your health care provider can offer advice or refer you to specialists for help  with various needs.  Do not use hot tubs, steam rooms, or saunas.  Do not douche or use tampons or scented sanitary pads.  Do not cross your legs for long periods of time.  Avoid cat litter boxes and soil used by cats. These carry germs that can cause birth defects in the baby and possibly loss of the fetus by miscarriage or stillbirth.  Avoid all smoking, herbs, alcohol, and medicines not prescribed by your health care provider. Chemicals in these products affect the formation and growth of the baby.  Do not use any products that contain nicotine or tobacco, such as cigarettes and e-cigarettes. If you need help quitting, ask your health care provider. You may receive counseling support and other resources to help you quit.  Schedule a dentist appointment. At home, brush your teeth with a soft toothbrush and be gentle when you floss. Contact a health care provider if:  You have dizziness.  You have mild pelvic cramps, pelvic pressure, or nagging pain in the abdominal area.  You have persistent nausea, vomiting, or diarrhea.  You have a bad smelling vaginal discharge.  You have pain when you urinate.  You notice increased swelling in your face, hands, legs, or ankles.  You are exposed to fifth disease or chickenpox.  You are exposed to [place] measles (rubella) and have never had it. Get help right away if:  You have a fever.  You are leaking fluid from your vagina.  You have spotting or bleeding from your vagina.  You have severe abdominal cramping or pain.  You have rapid weight gain or loss.  You vomit blood or material that looks like coffee grounds.  You develop a severe headache.  You have shortness of breath.  You have any kind of trauma, such as from a fall or a car accident. Summary  The first trimester of pregnancy is from week 1 until the end of week 13 (months 1 through 3).  Your body goes through many changes during pregnancy. The changes vary from  woman to woman.  You will have routine prenatal visits. During those visits, your health care provider will examine you, discuss any test results you may have, and talk with you about how you are feeling. This information is not intended to replace advice given to you by your health care provider. Make sure you discuss any questions you have with your health care provider. Document Released: 11/11/2001 Document Revised: 10/29/2016 Document Reviewed: 10/29/2016 Elsevier Interactive Patient Education  2019 [employer].  Hello,  Given the current COVID-19 pandemic, our practice is making changes in how we are providing care to our patients. We are limiting in-person visits for the safety of all of our patients.   As a practice, we have met to discuss the best way to minimize visits, but still provide excellent care to our expecting mothers.  We have decided on the following visit structure for low-risk pregnancies.  Initial Pregnancy visit will be conducted as a telephone or web visit.  Between 10-14 weeks  there will be one in-person visit for an ultrasound, lab work, and genetic screening. 20 weeks in-person visit with an anatomy ultrasound  28 weeks in-person office visit for a 1-hour glucose test and a TDAP vaccination 32 weeks in-person office visit 34 weeks telephone visit 36 weeks in-person office visit for GBS, chlamydia, and gonorrhea testing 38 weeks in-person office visit 40 weeks in-person office visit  Understandably, some patients will require more visits than what is outlined above. Additional visits will be determined on a case-by-case basis.   We will, as always, be available for emergencies or to address concerns that might arise between in-person visits. We ask that you allow [place] the opportunity to address any concerns over the phone or through a virtual visit first. We will be available to return your phone calls throughout the day.   If you are able to purchase a  scale, a blood pressure machine, and a home fetal doppler visits could be limited further. This will help decrease your exposure risks, but these purchases are not a necessity.   Things seem to change daily and there is the possibility that this structure could change, please be patient as we adapt to a new way of caring for patients.   Thank you for trusting [place] with your prenatal care. Our practice values you and looks forward to providing you with excellent care.   Sincerely,   [facility], [place]    COVID-19 and Your Pregnancy FAQ  How can I prevent infection with COVID-19 during my pregnancy? Social distancing is key. Please limit any interactions in public. Try and work from home if possible. Frequently wash your hands after touching possibly contaminated surfaces. Avoid touching your face.  Minimize trips to the store. Consider online ordering when possible.   Should I wear a mask? YES. It is recommended by the CDC that all people wear a cloth mask or facial covering in public. This will help reduce transmission as well as your risk or acquiring COVID-19. New studies are showing that even asymptomatic individuals can spread the virus from talking.   What are the symptoms of COVID-19? Fever (greater than 100.4 F), dry cough, shortness of breath.  Am I more at risk for COVID-19 since I am pregnant? There is not currently data showing that pregnant women are more adversely impacted by COVID-19 than the general population. However, we know that pregnant women tend to have worse respiratory complications from similar diseases such as the flu and SARS and for this reason should be considered an at-risk population.  What do I do if I am experiencing the symptoms of COVID-19? Testing is being limited because of test availability. If you are experiencing symptoms you should quarantine yourself, and the members of your family, for at least 2 weeks at home.   Please  visit this website for more information: [email or web address]  When should I go to the Emergency Room? Please go to the emergency room if you  are experiencing ANY of these symptoms*:  1.    Difficulty breathing or shortness of breath 2.    Persistent pain or pressure in the chest 3.    Confusion or difficulty being aroused (or awakened) 4.    Bluish lips or face  *This list is not all inclusive. Please consult our office for any other symptoms that are severe or concerning.  What do I do if I am having difficulty breathing? You should go to the Emergency Room for evaluation. At this time they have a tent set up for evaluating patients with COVID-19 symptoms.   How will my prenatal care be different because of the COVID-19 pandemic? It has been recommended to reduce the frequency of face-to-face visits and use resources such as telephone and virtual visits when possible. Using a scale, blood pressure machine and fetal doppler at home can further help reduce face-to-face visits. You will be provided with additional information on this topic.  We ask that you come to your visits alone to minimize potential exposures to  COVID-19.  How can I receive childbirth education? At this time in-person classes have been cancelled. You can register for online childbirth education, breastfeeding, and newborn care classes.  Please visit:  [email or web address] for more information  How will my hospital birth experience be different? The hospital is currently limiting visitors. This means that while you are in labor you can only have one person at the hospital with you. Additional family members will not be allowed to wait in the building or outside your room. Your one support person can be the father of the baby, a relative, a doula, or a friend. Once one support person is designated that person will wear a band. This band cannot be shared with  multiple people.  Nitrous Gas is not being offered for pain relief since the tubing and filter for the machine can not be sanitized in a way to guarantee prevention of transmission of COVID-19.  Nasal cannula use of oxygen for fetal indications has also been discontinued.  Currently a clear plastic sheet is being hung between mom and the delivering provider during pushing and delivery to help prevent transmission of COVID-19.      How long will I stay in the hospital for after giving birth? It is also recommended that discharge home be expedited during the COVID-19 outbreak. This means staying for 1 day after a vaginal delivery and 2 days after a cesarean section. Patients who need to stay longer for medical reasons are allowed to do so, but the goal will be for expedited discharge home.   What if I have COVID-19 and I am in labor? We ask that you wear a mask while on labor and delivery. We will try and accommodate you being placed in a room that is capable of filtering the air. Please call ahead if you are in labor and on your way to the hospital. The phone number for labor and delivery at [facility] is [phone].  If I have COVID-19 when my baby is born how can I prevent my baby from contracting COVID-19? This is an issue that will have to be discussed on a case-by-case basis. Current recommendations suggest providing separate isolation rooms for both the mother and new infant as well as limiting visitors. However, there are practical challenges to this recommendation. The situation will assuredly change and decisions will be influenced by the desires of the mother and availability of space.  Some  suggestions are the use of a curtain or physical barrier between mom and infant, hand hygiene, mom wearing a mask, or 6 feet of spacing between a mom and infant.   Can I breastfeed during the COVID-19 pandemic?   Yes, breastfeeding is encouraged.  Can I breastfeed if I have  COVID-19? Yes. Covid-19 has not been found in breast milk. This means you cannot give COVID-19 to your child through breast milk. Breast feeding will also help pass antibodies to fight infection to your baby.   What precautions should I take when breastfeeding if I have COVID-19? If a mother and newborn do room-in and the mother wishes to feed at the breast, she should put on a facemask and practice hand hygiene before each feeding.  What precautions should I take when pumping if I have COVID-19? Prior to expressing breast milk, mothers should practice hand hygiene. After each pumping session, all parts that come into contact with breast milk should be thoroughly washed and the entire pump should be appropriately disinfected per the manufacturers instructions. This expressed breast milk should be fed to the newborn by a healthy caregiver.  What if I am pregnant and work in healthcare? Based on limited data regarding COVID-19 and pregnancy, ACOG currently does not propose creating additional restrictions on pregnant health care personnel because of COVID-19 alone. Pregnant women do not appear to be at higher risk of severe disease related to COVID-19. Pregnant health care personnel should follow CDC risk assessment and infection control guidelines for health care personnel exposed to patients with suspected or confirmed COVID-19. Adherence to recommended infection prevention and control practices is an important part of protecting all health care personnel in health care settings.    Information on COVID-19 in pregnancy is very limited; however, facilities may want to consider limiting exposure of pregnant health care personnel to patients with confirmed or suspected COVID-19 infection, especially during higher-risk procedures (eg, aerosol-generating procedures), if feasible, based on staffing availability.

## 2019-03-23 NOTE — Progress Notes (Signed)
03/23/2019   Chief Complaint: Missed period  History of Present Illness: [name] is a 28 y.o. [id number] 4933w6d based on Patient's last menstrual period was 12/30/2018. with an Estimated Date of Delivery: 10/06/19, with the above CC.   Her periods were: regular periods every 28 days She was using no method when she conceived.  She has Positive signs or symptoms of nausea/vomiting of pregnancy. She has Negative signs or symptoms of miscarriage or preterm labor She identifies Negative Zika risk factors for her and her partner On any different medications around the time she conceived/early pregnancy: No  History of varicella: Yes   ROS: A 12-point review of systems was performed and negative, except as stated in the above HPI.  OBGYN History: As per HPI. OB History  Gravida Para Term Preterm AB Living  7 2 2   4 2   SAB TAB Ectopic Multiple Live Births  1            # Outcome Date GA Lbr Len/2nd Weight Sex Delivery Anes PTL Lv  7 Current           6 AB           5 AB           4 AB           3 SAB           2 Term  1315w0d   M Vag-Spont     1 Term  4824w0d   F Vag-Spont       Any issues with any prior pregnancies: no Any prior children are healthy, doing well, without any problems or issues: yes History of pap smears: Yes. Last pap smear 2017. Abnormal: no  History of STIs: No   Past Medical History: Past Medical History:  Diagnosis Date  . Medical history non-contributory     Past Surgical History: Past Surgical History:  Procedure Laterality Date  . APPENDECTOMY      Family History:  History reviewed. No pertinent family history. She denies any female cancers, bleeding or blood clotting disorders.  She denies any history of mental retardation, birth defects or genetic disorders in her or the FOB's history  Social History:  Social History   Socioeconomic History  . Marital status: Single    Spouse name: Not on file  . Number of children: Not on file  . Years of  education: Not on file  . Highest education level: Not on file  Occupational History  . Not on file  Social Needs  . Financial resource strain: Not on file  . Food insecurity:    Worry: Not on file    Inability: Not on file  . Transportation needs:    Medical: Not on file    Non-medical: Not on file  Tobacco Use  . Smoking status: Former Smoker    Packs/day: 0.25    Years: 3.00    Pack years: 0.75    Types: Cigarettes  . Smokeless tobacco: Never Used  Substance and Sexual Activity  . Alcohol use: No  . Drug use: Not on file  . Sexual activity: Yes  Lifestyle  . Physical activity:    Days per week: Not on file    Minutes per session: Not on file  . Stress: Not on file  Relationships  . Social connections:    Talks on phone: Not on file    Gets together: Not on file    Attends religious service: Not  on file    Active member of club or organization: Not on file    Attends meetings of clubs or organizations: Not on file    Relationship status: Not on file  . Intimate partner violence:    Fear of current or ex partner: Not on file    Emotionally abused: Not on file    Physically abused: Not on file    Forced sexual activity: Not on file  Other Topics Concern  . Not on file  Social History Narrative  . Not on file   Any pets in the household: no  Allergy: Allergies  Allergen Reactions  . Oxycodone Other (See Comments)    Reaction: unknown    Current Outpatient Medications: No current outpatient medications on file.   Physical Exam:   BP 124/80   Wt 223 lb (101.2 kg)   LMP 12/30/2018 Comment: neg preg test  BMI 37.11 kg/m  Body mass index is 37.11 kg/m. Constitutional: Well nourished, well developed female in no acute distress.  Neck:  Supple, normal appearance, and no thyromegaly  Cardiovascular: S1, S2 normal, no murmur, rub or gallop, regular rate and rhythm Respiratory:  Clear to auscultation bilateral. Normal respiratory effort Abdomen: positive  bowel sounds and no masses, hernias; diffusely non tender to palpation, non distended Breasts: breasts appear normal, no suspicious masses, no skin or nipple changes or axillary nodes. Neuro/Psych:  Normal mood and affect.  Skin:  Warm and dry.  Lymphatic:  No inguinal lymphadenopathy.   Pelvic exam: is not limited by body habitus EGBUS: within normal limits, Vagina: within normal limits and with no blood in the vault, Cervix: normal appearing cervix without discharge or lesions, closed/long/high, Uterus:  enlarged: 10 weeks, and Adnexa:  normal adnexa and no mass, fullness, tenderness  Assessment: [name] is a 28 y.o. [id number] [redacted]w[redacted]d based on Patient's last menstrual period was 12/30/2018. with an Estimated Date of Delivery: 10/06/19,  for prenatal care.  Plan:  1) Avoid alcoholic beverages. 2) Patient encouraged not to smoke.  3) Discontinue the use of all non-medicinal drugs and chemicals.  4) Take prenatal vitamins daily.  5) Seatbelt use advised 6) Nutrition, food safety (fish, cheese advisories, and high nitrite foods) and exercise discussed. 7) Hospital and practice style delivering at [facility] discussed  8) Patient is asked about travel to areas at risk for the Zika virus, and counseled to avoid travel and exposure to mosquitoes or sexual partners who may have themselves been exposed to the virus. Testing is discussed, and will be ordered as appropriate.  9) Childbirth classes at [facility] advised 10) Genetic Screening, such as with 1st Trimester Screening, cell free fetal DNA, AFP testing, and Ultrasound, as well as with amniocentesis and CVS as appropriate, is discussed with patient. She plans to have genetic testing this pregnancy. 11) [place] and genetic labs based on conformed/changed EDC nv 12) Zofran for nausea, monitor for response  Problem list reviewed and updated.  Annamarie Major, MD, [facility], [facility] 03/23/2019  2:42 PM

## 2019-03-23 NOTE — Addendum Note (Signed)
Addended by: Cornelius Moras D on: 03/23/2019 03:28 PM   Modules accepted: Orders

## 2019-03-25 LAB — RPR+RH+ABO+RUB AB+AB SCR+CB...
Antibody Screen: NEGATIVE
HIV Screen 4th Generation wRfx: NONREACTIVE
Hematocrit: 33 % — ABNORMAL LOW (ref 34.0–46.6)
Hemoglobin: 11 g/dL — ABNORMAL LOW (ref 11.1–15.9)
Hepatitis B Surface Ag: NEGATIVE
MCH: 26.3 pg — ABNORMAL LOW (ref 26.6–33.0)
MCHC: 33.3 g/dL (ref 31.5–35.7)
MCV: 79 fL (ref 79–97)
Platelets: 259 10*3/uL (ref 150–450)
RBC: 4.19 x10E6/uL (ref 3.77–5.28)
RDW: 14.2 % (ref 11.7–15.4)
RPR Ser Ql: NONREACTIVE
Rh Factor: POSITIVE
Rubella Antibodies, IGG: 3.96 index (ref >0.99)
Varicella zoster IgG: 464 index (ref >165)
WBC: 7.4 10*3/uL (ref 3.4–10.8)

## 2019-03-25 LAB — HEMOGLOBINOPATHY EVALUATION
HGB C: 0 %
HGB S: 0 %
HGB VARIANT: 0 %
Hemoglobin A2 Quantitation: 2.4 % (ref 1.8–3.2)
Hemoglobin F Quantitation: 0.7 % (ref 0.0–2.0)
Hgb A: 96.9 % (ref 96.4–98.8)

## 2019-03-25 LAB — CYTOLOGY - PAP
Chlamydia: NEGATIVE
Diagnosis: NEGATIVE
Neisseria Gonorrhea: NEGATIVE
Trichomonas: NEGATIVE

## 2019-03-25 LAB — URINE CULTURE

## 2019-04-04 ENCOUNTER — Ambulatory Visit (INDEPENDENT_AMBULATORY_CARE_PROVIDER_SITE_OTHER): Payer: 59

## 2019-04-04 ENCOUNTER — Other Ambulatory Visit: Payer: Self-pay

## 2019-04-04 DIAGNOSIS — Z3687 Encounter for antenatal screening for uncertain dates: Secondary | ICD-10-CM

## 2019-04-04 DIAGNOSIS — N926 Irregular menstruation, unspecified: Secondary | ICD-10-CM

## 2019-04-05 ENCOUNTER — Encounter: Payer: Self-pay | Admitting: Advanced Practice Midwife

## 2019-04-05 ENCOUNTER — Ambulatory Visit (INDEPENDENT_AMBULATORY_CARE_PROVIDER_SITE_OTHER): Payer: 59 | Admitting: Advanced Practice Midwife

## 2019-04-05 ENCOUNTER — Other Ambulatory Visit: Payer: Self-pay

## 2019-04-05 VITALS — BP 128/80 | Wt 228.0 lb

## 2019-04-05 DIAGNOSIS — Z3A13 13 weeks gestation of pregnancy: Secondary | ICD-10-CM

## 2019-04-05 DIAGNOSIS — O0990 Supervision of high risk pregnancy, unspecified, unspecified trimester: Secondary | ICD-10-CM

## 2019-04-05 DIAGNOSIS — Z3481 Encounter for supervision of other normal pregnancy, first trimester: Secondary | ICD-10-CM

## 2019-04-05 HISTORY — DX: Supervision of high risk pregnancy, unspecified, unspecified trimester: O09.90

## 2019-04-05 LAB — POCT URINALYSIS DIPSTICK OB
Glucose, UA: NEGATIVE
POC,PROTEIN,UA: NEGATIVE

## 2019-04-05 NOTE — Patient Instructions (Signed)
 COVID-19 and Your Pregnancy FAQ  How can I prevent infection with COVID-19 during my pregnancy? Social distancing is key. Please limit any interactions in public. Try and work from home if possible. Frequently wash your hands after touching possibly contaminated surfaces. Avoid touching your face.  Minimize trips to the store. Consider online ordering when possible.   Should I wear a mask? YES. It is recommended by the CDC that all people wear a cloth mask or facial covering in public. You should wear a mask to your visits in the office. This will help reduce transmission as well as your risk or acquiring COVID-19. New studies are showing that even asymptomatic individuals can spread the virus from talking.   Where can I get a mask? [place] and the city of [place] are partnering to provide masks to community members. You can pick up a mask from several locations. This website also has instructions about how to make a mask by sewing or without sewing by using a t-shirt or bandana.  https://www.[place].gov/i-want-to/learn-about/covid-19-information-and-updates/covid-19-face-mask-project  Studies have shown that if you were a tube or nylon stocking from pantyhose over a cloth mask it makes the cloth mask almost as effective as a N95 mask.  https://www.npr.org/sections/goatsandsoda/2019/03/23/840146830/adding-a-nylon-stocking-layer-could-boost-protection-from-cloth-masks-study-find  What are the symptoms of COVID-19? Fever (greater than 100.4 F), dry cough, shortness of breath.  Am I more at risk for COVID-19 since I am pregnant? There is not currently data showing that pregnant women are more adversely impacted by COVID-19 than the general population. However, we know that pregnant women tend to have worse respiratory complications from similar diseases such as the flu and SARS and for this reason should be considered an at-risk population.  What do I do if I am experiencing the  symptoms of COVID-19? Testing is being limited because of test availability. If you are experiencing symptoms you should quarantine yourself, and the members of your family, for at least 2 weeks at home.   Please visit this website for more information: https://www.cdc.gov/coronavirus/2019-ncov/if-you-are-sick/steps-when-sick.html  When should I go to the Emergency Room? Please go to the emergency room if you are experiencing ANY of these symptoms*:  1.    Difficulty breathing or shortness of breath 2.    Persistent pain or pressure in the chest 3.    Confusion or difficulty being aroused (or awakened) 4.    Bluish lips or face  *This list is not all inclusive. Please consult our office for any other symptoms that are severe or concerning.  What do I do if I am having difficulty breathing? You should go to the Emergency Room for evaluation. At this time they have a tent set up for evaluating patients with COVID-19 symptoms.   How will my prenatal care be different because of the COVID-19 pandemic? It has been recommended to reduce the frequency of face-to-face visits and use resources such as telephone and virtual visits when possible. Using a scale, blood pressure machine and fetal doppler at home can further help reduce face-to-face visits. You will be provided with additional information on this topic.  We ask that you come to your visits alone to minimize potential exposures to  COVID-19.  How can I receive childbirth education? At this time in-person classes have been cancelled. You can register for online childbirth education, breastfeeding, and newborn care classes.  Please visit:  [email or web address] for more information  How will my hospital birth experience be different? The hospital is currently limiting visitors. This means that while you are in   labor you can only have one person at the hospital with you. Additional family members will not be allowed to wait in the  building or outside your room. Your one support person can be the father of the baby, a relative, a doula, or a friend. Once one support person is designated that person will wear a band. This band cannot be shared with multiple people.  Nitrous Gas is not being offered for pain relief since the tubing and filter for the machine can not be sanitized in a way to guarantee prevention of transmission of COVID-19.  Nasal cannula use of oxygen for fetal indications has also been discontinued.  Currently a clear plastic sheet is being hung between mom and the delivering provider during pushing and delivery to help prevent transmission of COVID-19.      How long will I stay in the hospital for after giving birth? It is also recommended that discharge home be expedited during the COVID-19 outbreak. This means staying for 1 day after a vaginal delivery and 2 days after a cesarean section. Patients who need to stay longer for medical reasons are allowed to do so, but the goal will be for expedited discharge home.   What if I have COVID-19 and I am in labor? We ask that you wear a mask while on labor and delivery. We will try and accommodate you being placed in a room that is capable of filtering the air. Please call ahead if you are in labor and on your way to the hospital. The phone number for labor and delivery at  [facility] is [phone].  If I have COVID-19 when my baby is born how can I prevent my baby from contracting COVID-19? This is an issue that will have to be discussed on a case-by-case basis. Current recommendations suggest providing separate isolation rooms for both the mother and new infant as well as limiting visitors. However, there are practical challenges to this recommendation. The situation will assuredly change and decisions will be influenced by the desires of the mother and availability of space.  Some suggestions are the use of a curtain or physical barrier  between mom and infant, hand hygiene, mom wearing a mask, or 6 feet of spacing between a mom and infant.   Can I breastfeed during the COVID-19 pandemic?   Yes, breastfeeding is encouraged.  Can I breastfeed if I have COVID-19? Yes. Covid-19 has not been found in breast milk. This means you cannot give COVID-19 to your child through breast milk. Breast feeding will also help pass antibodies to fight infection to your baby.   What precautions should I take when breastfeeding if I have COVID-19? If a mother and newborn do room-in and the mother wishes to feed at the breast, she should put on a facemask and practice hand hygiene before each feeding.  What precautions should I take when pumping if I have COVID-19? Prior to expressing breast milk, mothers should practice hand hygiene. After each pumping session, all parts that come into contact with breast milk should be thoroughly washed and the entire pump should be appropriately disinfected per the manufacturer's instructions. This expressed breast milk should be fed to the newborn by a healthy caregiver.  What if I am pregnant and work in healthcare? Based on limited data regarding COVID-19 and pregnancy, ACOG currently does not propose creating additional restrictions on pregnant health care personnel because of COVID-19 alone. Pregnant women do not appear to be at higher   risk of severe disease related to COVID-19. Pregnant health care personnel should follow CDC risk assessment and infection control guidelines for health care personnel exposed to patients with suspected or confirmed COVID-19. Adherence to recommended infection prevention and control practices is an important part of protecting all health care personnel in health care settings.    Information on COVID-19 in pregnancy is very limited; however, facilities may want to consider limiting exposure of pregnant health care personnel to patients with confirmed or suspected COVID-19  infection, especially during higher-risk procedures (eg, aerosol-generating procedures), if feasible, based on staffing availability.    

## 2019-04-05 NOTE — Progress Notes (Signed)
  Routine Prenatal Care Visit  Subjective  Sarah Holland is a 28 y.o. [id number] at [redacted]w[redacted]d being seen today for ongoing prenatal care.  She is currently monitored for the following issues for this high-risk pregnancy and has SIRS (systemic inflammatory response syndrome) (HCC); Fever; and Supervision of high risk pregnancy, antepartum on their problem list.  ----------------------------------------------------------------------------------- Patient reports allergy symptoms.    . Vag. Bleeding: None.   . Denies leaking of fluid.  ----------------------------------------------------------------------------------- The following portions of the patient's history were reviewed and updated as appropriate: allergies, current medications, past family history, past medical history, past social history, past surgical history and problem list. Problem list updated.   Objective  Blood pressure 128/80, weight 228 lb (103.4 kg), last menstrual period 12/30/2018. Pregravid weight 213 lb (96.6 kg) Total Weight Gain 15 lb (6.804 kg) Urinalysis: Urine Protein    Urine Glucose    Fetal Status: Fetal Heart Rate (bpm): 152         General:  Alert, oriented and cooperative. Patient is in no acute distress.  Skin: Skin is warm and dry. No rash noted.   Cardiovascular: Normal heart rate noted  Respiratory: Normal respiratory effort, no problems with respiration noted  Abdomen: Soft, gravid, appropriate for gestational age. Pain/Pressure: Absent     Pelvic:  Cervical exam deferred        Extremities: Normal range of motion.     Mental Status: Normal mood and affect. Normal behavior. Normal judgment and thought content.   Assessment   28 y.o. [id number] at [redacted]w[redacted]d by  10/06/2019, by Last Menstrual Period presenting for routine prenatal visit  Plan   pregnancy6 Problems (from 12/30/18 to present)    No problems associated with this episode.       Preterm labor symptoms and general obstetric precautions  including but not limited to vaginal bleeding, contractions, leaking of fluid and fetal movement were reviewed in detail with the patient. Please refer to After Visit Summary for other counseling recommendations.   Return in about 6 weeks (around 05/17/2019) for anatomy scan and rob.  Tresea Mall, CNM 04/05/2019 3:00 PM

## 2019-04-05 NOTE — Addendum Note (Signed)
Addended by: Cornelius Moras D on: 04/05/2019 03:06 PM   Modules accepted: Orders

## 2019-04-10 LAB — MATERNIT 21 PLUS CORE, BLOOD
Fetal Fraction: 6
Result (T21): NEGATIVE
Trisomy 13 (Patau syndrome): NEGATIVE
Trisomy 18 (Edwards syndrome): NEGATIVE
Trisomy 21 (Down syndrome): NEGATIVE

## 2019-04-29 ENCOUNTER — Encounter: Payer: Self-pay | Admitting: Emergency Medicine

## 2019-04-29 ENCOUNTER — Other Ambulatory Visit: Payer: Self-pay

## 2019-04-29 ENCOUNTER — Emergency Department
Admission: EM | Admit: 2019-04-29 | Discharge: 2019-04-29 | Disposition: A | Discharge status: Home / Self Care | Payer: 59 | Attending: Emergency Medicine | Admitting: Emergency Medicine

## 2019-04-29 DIAGNOSIS — Z87891 Personal history of nicotine dependence: Secondary | ICD-10-CM | POA: Diagnosis not present

## 2019-04-29 DIAGNOSIS — R1030 Lower abdominal pain, unspecified: Secondary | ICD-10-CM | POA: Insufficient documentation

## 2019-04-29 DIAGNOSIS — Z041 Encounter for examination and observation following transport accident: Secondary | ICD-10-CM | POA: Diagnosis not present

## 2019-04-29 DIAGNOSIS — O9989 Other specified diseases and conditions complicating pregnancy, childbirth and the puerperium: Secondary | ICD-10-CM | POA: Diagnosis not present
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
## 2019-04-29 NOTE — ED Notes (Signed)
Eileen Stanford, RN and Tobi Bastos, RN attempted to hear fetal heart tones.  This RN heard briefly for less than 15 seconds but was not able to calculate fetal heart rate.  MD aware.

## 2019-04-29 NOTE — ED Notes (Signed)
Patient discussed with Dr. Cyril Loosen. No verbal orders given at this time.

## 2019-04-29 NOTE — ED Notes (Signed)
Pt alert and oriented X4, active, cooperative, pt in NAD. RR even and unlabored, color WNL.  Pt informed to return if any life threatening symptoms occur.  Discharge and followup instructions reviewed.  
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
## 2019-05-16 ENCOUNTER — Other Ambulatory Visit: Payer: Self-pay

## 2019-05-16 ENCOUNTER — Ambulatory Visit (INDEPENDENT_AMBULATORY_CARE_PROVIDER_SITE_OTHER): Payer: 59 | Admitting: Maternal Newborn

## 2019-05-16 ENCOUNTER — Ambulatory Visit (INDEPENDENT_AMBULATORY_CARE_PROVIDER_SITE_OTHER): Payer: 59

## 2019-05-16 ENCOUNTER — Encounter: Payer: Self-pay | Admitting: Maternal Newborn

## 2019-05-16 VITALS — BP 134/70 | Wt 236.0 lb

## 2019-05-16 DIAGNOSIS — O0990 Supervision of high risk pregnancy, unspecified, unspecified trimester: Secondary | ICD-10-CM

## 2019-05-16 DIAGNOSIS — Z363 Encounter for antenatal screening for malformations: Secondary | ICD-10-CM

## 2019-05-16 DIAGNOSIS — Z3A19 19 weeks gestation of pregnancy: Secondary | ICD-10-CM

## 2019-05-16 DIAGNOSIS — O0992 Supervision of high risk pregnancy, unspecified, second trimester: Secondary | ICD-10-CM

## 2019-05-16 LAB — POCT URINALYSIS DIPSTICK OB: Glucose, UA: NEGATIVE

## 2019-05-16 NOTE — Progress Notes (Signed)
    Routine Prenatal Care Visit  Subjective  Sarah Holland is a 28 y.o. [id number] at [redacted]w[redacted]d being seen today for ongoing prenatal care.  She is currently monitored for the following issues for this high-risk pregnancy and has SIRS (systemic inflammatory response syndrome) ([place]) and Supervision of high risk pregnancy, antepartum on their problem list. ----------------------------------------------------------------------------------- Patient reports carpal tunnel symptoms with tingling/burning and numbness in her hands, most often when she is trying to sleep. She has occasional lightheadedness, especially when bending/lifting at work.   Contractions: Not present. Vag. Bleeding: None.  Movement: Present. No leaking of fluid.  ----------------------------------------------------------------------------------- The following portions of the patient's history were reviewed and updated as appropriate: allergies, current medications, past family history, past medical history, past social history, past surgical history and problem list. Problem list updated.  Objective  Blood pressure 134/70, weight 236 lb (107 kg), last menstrual period 12/30/2018. Pregravid weight 213 lb (96.6 kg) Total Weight Gain 23 lb (10.4 kg) Urinalysis: Urine dipstick shows negative for glucose, positive for protein (small). Fetal Status: Fetal Heart Rate (bpm): 135   Movement: Present     General:  Alert, oriented and cooperative. Patient is in no acute distress.  Skin: Skin is warm and dry. No rash noted.   Cardiovascular: Normal heart rate noted  Respiratory: Normal respiratory effort, no problems with respiration noted  Abdomen: Soft, gravid, appropriate for gestational age. Pain/Pressure: Absent     Pelvic:  Cervical exam deferred        Extremities: Normal range of motion.     Mental Status: Normal mood and affect. Normal behavior. Normal judgment and thought content.     Assessment   28 y.o. [id number] at [redacted]w[redacted]d, EDD  10/06/2019 by Last Menstrual Period presenting for a routine prenatal visit.  Plan   pregnancy6 Problems (from 12/30/18 to present)    No problems associated with this episode.    Anatomy scan complete, breech presentation. Discussed with patient.  Advised using wrist braces while asleep for relief of carpal tunnel symptoms.  Discussed measures to help with lightheadedness such as good body mechanics when bending and lifting, slow position changes, staying well hydrated, and taking rest breaks. She is not currently taking prenatal vitamins with iron; samples given and she will let us know if she would like a prescription.  Please refer to After Visit Summary for other counseling recommendations.   Return in about 4 weeks (around 06/13/2019) for ROB.  Avel Sensor, CNM 05/16/2019  2:23 PM

## 2019-05-16 NOTE — Patient Instructions (Signed)

## 2019-05-16 NOTE — Progress Notes (Signed)
ROB Anatomy scan/ It is a BOY!! Hands going numb/sting Dizzy.lightheadedness

## 2019-06-13 ENCOUNTER — Other Ambulatory Visit: Payer: Self-pay

## 2019-06-13 ENCOUNTER — Ambulatory Visit (INDEPENDENT_AMBULATORY_CARE_PROVIDER_SITE_OTHER): Payer: 59 | Admitting: Certified Nurse Midwife

## 2019-06-13 ENCOUNTER — Encounter: Payer: Self-pay | Admitting: Certified Nurse Midwife

## 2019-06-13 VITALS — BP 106/70 | Wt 235.4 lb

## 2019-06-13 DIAGNOSIS — O26899 Other specified pregnancy related conditions, unspecified trimester: Secondary | ICD-10-CM

## 2019-06-13 DIAGNOSIS — Z3A23 23 weeks gestation of pregnancy: Secondary | ICD-10-CM

## 2019-06-13 DIAGNOSIS — Z130 Encounter for screening for diseases of the blood and blood-forming organs and certain disorders involving the immune mechanism: Secondary | ICD-10-CM

## 2019-06-13 DIAGNOSIS — Z113 Encounter for screening for infections with a predominantly sexual mode of transmission: Secondary | ICD-10-CM

## 2019-06-13 DIAGNOSIS — R202 Paresthesia of skin: Secondary | ICD-10-CM

## 2019-06-13 DIAGNOSIS — E669 Obesity, unspecified: Secondary | ICD-10-CM

## 2019-06-13 DIAGNOSIS — O26892 Other specified pregnancy related conditions, second trimester: Secondary | ICD-10-CM

## 2019-06-13 DIAGNOSIS — G5600 Carpal tunnel syndrome, unspecified upper limb: Secondary | ICD-10-CM

## 2019-06-13 DIAGNOSIS — Z131 Encounter for screening for diabetes mellitus: Secondary | ICD-10-CM

## 2019-06-13 DIAGNOSIS — O0990 Supervision of high risk pregnancy, unspecified, unspecified trimester: Secondary | ICD-10-CM

## 2019-06-13 LAB — POCT URINALYSIS DIPSTICK OB
Glucose, UA: NEGATIVE
POC,PROTEIN,UA: NEGATIVE

## 2019-06-13 NOTE — Progress Notes (Signed)
ROB- hands stay numb all day (6 weeks), vaginal pressure for the last 3 days

## 2019-06-26 ENCOUNTER — Encounter: Payer: Self-pay | Admitting: Certified Nurse Midwife

## 2019-06-26 DIAGNOSIS — E669 Obesity, unspecified: Secondary | ICD-10-CM | POA: Insufficient documentation

## 2019-06-26 NOTE — Progress Notes (Signed)
ROB at 23wk 4days: Complains of hands staying numb all the time. Has tried using wrist splints...but has not helped. Will refer to orthopedics. Complains of increased pelvic pressure when standing. Baby active. Denies regular contractions, vaginal bleeding, LOF. Cervix L/T/C/OOP ROB in 4 weeks with 28 week labs.

## 2019-07-11 ENCOUNTER — Other Ambulatory Visit: Payer: Self-pay

## 2019-07-11 ENCOUNTER — Ambulatory Visit (INDEPENDENT_AMBULATORY_CARE_PROVIDER_SITE_OTHER): Payer: 59 | Admitting: Certified Nurse Midwife

## 2019-07-11 ENCOUNTER — Other Ambulatory Visit: Payer: 59

## 2019-07-11 VITALS — BP 120/70 | Wt 241.4 lb

## 2019-07-11 DIAGNOSIS — Z130 Encounter for screening for diseases of the blood and blood-forming organs and certain disorders involving the immune mechanism: Secondary | ICD-10-CM

## 2019-07-11 DIAGNOSIS — Z113 Encounter for screening for infections with a predominantly sexual mode of transmission: Secondary | ICD-10-CM

## 2019-07-11 DIAGNOSIS — G5600 Carpal tunnel syndrome, unspecified upper limb: Secondary | ICD-10-CM

## 2019-07-11 DIAGNOSIS — O0990 Supervision of high risk pregnancy, unspecified, unspecified trimester: Secondary | ICD-10-CM

## 2019-07-11 DIAGNOSIS — Z6841 Body Mass Index (BMI) 40.0 and over, adult: Secondary | ICD-10-CM

## 2019-07-11 DIAGNOSIS — Z131 Encounter for screening for diabetes mellitus: Secondary | ICD-10-CM

## 2019-07-11 DIAGNOSIS — O26892 Other specified pregnancy related conditions, second trimester: Secondary | ICD-10-CM

## 2019-07-11 DIAGNOSIS — O26899 Other specified pregnancy related conditions, unspecified trimester: Secondary | ICD-10-CM

## 2019-07-11 DIAGNOSIS — O99210 Obesity complicating pregnancy, unspecified trimester: Secondary | ICD-10-CM

## 2019-07-11 DIAGNOSIS — O0992 Supervision of high risk pregnancy, unspecified, second trimester: Secondary | ICD-10-CM

## 2019-07-11 DIAGNOSIS — Z3A27 27 weeks gestation of pregnancy: Secondary | ICD-10-CM

## 2019-07-11 DIAGNOSIS — O99212 Obesity complicating pregnancy, second trimester: Secondary | ICD-10-CM

## 2019-07-11 DIAGNOSIS — O26842 Uterine size-date discrepancy, second trimester: Secondary | ICD-10-CM

## 2019-07-11 LAB — POCT URINALYSIS DIPSTICK OB

## 2019-07-11 NOTE — Progress Notes (Signed)
C/o hands swelling; ankles swelling; spells of getting real hot and feeling like she is going to faint.

## 2019-07-11 NOTE — Progress Notes (Signed)
ROB at 27wk4days: BMI now 40.17 kg/m2. Continues to have CTS symptoms. [facility] has been consulted, but they have not contacted the patient. Given their number for patient to call. 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 Baby active. 28 week labs today AB+ blood type. FH 34 cm. FHTs WNL  A: IUP at 27wk4days S>D, BMI>40 Carpel tunnel symptoms P: Growth scan and ROB in 1 week. TSH added to 28 week labs Dalia Heading, [place]

## 2019-07-12 LAB — 28 WEEK RH+PANEL
Basophils Absolute: 0 10*3/uL (ref 0.0–0.2)
Basos: 1 %
EOS (ABSOLUTE): 0.2 10*3/uL (ref 0.0–0.4)
Eos: 3 %
Gestational Diabetes Screen: 204 mg/dL — ABNORMAL HIGH (ref 65–139)
HIV Screen 4th Generation wRfx: NONREACTIVE
Hematocrit: 32 % — ABNORMAL LOW (ref 34.0–46.6)
Hemoglobin: 10.5 g/dL — ABNORMAL LOW (ref 11.1–15.9)
Immature Grans (Abs): 0 10*3/uL (ref 0.0–0.1)
Immature Granulocytes: 1 %
Lymphocytes Absolute: 1.1 10*3/uL (ref 0.7–3.1)
Lymphs: 18 %
MCH: 25.3 pg — ABNORMAL LOW (ref 26.6–33.0)
MCHC: 32.8 g/dL (ref 31.5–35.7)
MCV: 77 fL — ABNORMAL LOW (ref 79–97)
Monocytes Absolute: 0.4 10*3/uL (ref 0.1–0.9)
Monocytes: 7 %
Neutrophils Absolute: 4.4 10*3/uL (ref 1.4–7.0)
Neutrophils: 70 %
Platelets: 257 10*3/uL (ref 150–450)
RBC: 4.15 x10E6/uL (ref 3.77–5.28)
RDW: 12.5 % (ref 11.7–15.4)
RPR Ser Ql: NONREACTIVE
WBC: 6 10*3/uL (ref 3.4–10.8)

## 2019-07-13 LAB — SPECIMEN STATUS REPORT

## 2019-07-13 LAB — TSH: TSH: 1.24 u[IU]/mL (ref 0.450–4.500)

## 2019-07-14 ENCOUNTER — Telehealth: Payer: Self-pay | Admitting: Certified Nurse Midwife

## 2019-07-14 DIAGNOSIS — R7309 Other abnormal glucose: Secondary | ICD-10-CM

## 2019-07-14 NOTE — Telephone Encounter (Signed)
Patient called with lab results. 1hr GTT was 204 and TSH was normal. Needs 3 hr GTT. Will schedule. Dalia Heading, CNM

## 2019-07-14 NOTE — Telephone Encounter (Signed)
Patient is schedule 07/19/19

## 2019-07-14 NOTE — Telephone Encounter (Signed)
-----   Message from Dalia Heading, [place] sent at 07/14/2019  6:36 AM EDT ----- Regarding: Schedule test Please call patient to schedule a 3 hour GTT. I have already discussed this test with her. She is coming in next week for an appointment. Can we add on a 3 hour GTT to that visit?  Dalia Heading, CNM

## 2019-07-19 ENCOUNTER — Encounter: Payer: Self-pay | Admitting: Maternal Newborn

## 2019-07-19 ENCOUNTER — Ambulatory Visit (INDEPENDENT_AMBULATORY_CARE_PROVIDER_SITE_OTHER): Payer: 59

## 2019-07-19 ENCOUNTER — Other Ambulatory Visit: Payer: Self-pay

## 2019-07-19 ENCOUNTER — Other Ambulatory Visit: Payer: 59

## 2019-07-19 ENCOUNTER — Ambulatory Visit (INDEPENDENT_AMBULATORY_CARE_PROVIDER_SITE_OTHER): Payer: 59 | Admitting: Maternal Newborn

## 2019-07-19 VITALS — BP 120/80 | Wt 240.0 lb

## 2019-07-19 DIAGNOSIS — Z6841 Body Mass Index (BMI) 40.0 and over, adult: Secondary | ICD-10-CM

## 2019-07-19 DIAGNOSIS — O26842 Uterine size-date discrepancy, second trimester: Secondary | ICD-10-CM

## 2019-07-19 DIAGNOSIS — O0990 Supervision of high risk pregnancy, unspecified, unspecified trimester: Secondary | ICD-10-CM

## 2019-07-19 DIAGNOSIS — O99210 Obesity complicating pregnancy, unspecified trimester: Secondary | ICD-10-CM

## 2019-07-19 DIAGNOSIS — R7309 Other abnormal glucose: Secondary | ICD-10-CM

## 2019-07-19 DIAGNOSIS — O0993 Supervision of high risk pregnancy, unspecified, third trimester: Secondary | ICD-10-CM

## 2019-07-19 DIAGNOSIS — Z362 Encounter for other antenatal screening follow-up: Secondary | ICD-10-CM

## 2019-07-19 DIAGNOSIS — Z3A28 28 weeks gestation of pregnancy: Secondary | ICD-10-CM

## 2019-07-19 LAB — POCT URINALYSIS DIPSTICK OB

## 2019-07-19 NOTE — Patient Instructions (Signed)
Third Trimester of Pregnancy The third trimester is from week 28 through week 40 (months 7 through 9). The third trimester is a time when the unborn baby (fetus) is growing rapidly. At the end of the ninth month, the fetus is about 20 inches in length and weighs 6-10 pounds. Body changes during your third trimester Your body will continue to go through many changes during pregnancy. The changes vary from woman to woman. During the third trimester:  Your weight will continue to increase. You can expect to gain 25-35 pounds (11-16 kg) by the end of the pregnancy.  You may begin to get stretch marks on your hips, abdomen, and breasts.  You may urinate more often because the fetus is moving lower into your pelvis and pressing on your bladder.  You may develop or continue to have heartburn. This is caused by increased hormones that slow down muscles in the digestive tract.  You may develop or continue to have constipation because increased hormones slow digestion and cause the muscles that push waste through your intestines to relax.  You may develop hemorrhoids. These are swollen veins (varicose veins) in the rectum that can itch or be painful.  You may develop swollen, bulging veins (varicose veins) in your legs.  You may have increased body aches in the pelvis, back, or thighs. This is due to weight gain and increased hormones that are relaxing your joints.  You may have changes in your hair. These can include thickening of your hair, rapid growth, and changes in texture. Some women also have hair loss during or after pregnancy, or hair that feels dry or thin. Your hair will most likely return to normal after your baby is born.  Your breasts will continue to grow and they will continue to become tender. A yellow fluid (colostrum) may leak from your breasts. This is the first milk you are producing for your baby.  Your belly button may stick out.  You may notice more swelling in your hands,  face, or ankles.  You may have increased tingling or numbness in your hands, arms, and legs. The skin on your belly may also feel numb.  You may feel short of breath because of your expanding uterus.  You may have more problems sleeping. This can be caused by the size of your belly, increased need to urinate, and an increase in your body's metabolism.  You may notice the fetus "dropping," or moving lower in your abdomen (lightening).  You may have increased vaginal discharge.  You may notice your joints feel loose and you may have pain around your pelvic bone. What to expect at prenatal visits You will have prenatal exams every 2 weeks until week 36. Then you will have weekly prenatal exams. During a routine prenatal visit:  You will be weighed to make sure you and the baby are growing normally.  Your blood pressure will be taken.  Your abdomen will be measured to track your baby's growth.  The fetal heartbeat will be listened to.  Any test results from the previous visit will be discussed.  You may have a cervical check near your due date to see if your cervix has softened or thinned (effaced).  You will be tested for Group B streptococcus. This happens between 35 and 37 weeks. Your health care provider may ask you:  What your birth plan is.  How you are feeling.  If you are feeling the baby move.  If you have had any abnormal   symptoms, such as leaking fluid, bleeding, severe headaches, or abdominal cramping.  If you are using any tobacco products, including cigarettes, chewing tobacco, and electronic cigarettes.  If you have any questions. Other tests or screenings that may be performed during your third trimester include:  Blood tests that check for low iron levels (anemia).  Fetal testing to check the health, activity level, and growth of the fetus. Testing is done if you have certain medical conditions or if there are problems during the pregnancy.  Nonstress test  (NST). This test checks the health of your baby to make sure there are no signs of problems, such as the baby not getting enough oxygen. During this test, a belt is placed around your belly. The baby is made to move, and its heart rate is monitored during movement. What is false labor? False labor is a condition in which you feel small, irregular tightenings of the muscles in the womb (contractions) that usually go away with rest, changing position, or drinking water. These are called Braxton Hicks contractions. Contractions may last for hours, days, or even weeks before true labor sets in. If contractions come at regular intervals, become more frequent, increase in intensity, or become painful, you should see your health care provider. What are the signs of labor?  Abdominal cramps.  Regular contractions that start at 10 minutes apart and become stronger and more frequent with time.  Contractions that start on the top of the uterus and spread down to the lower abdomen and back.  Increased pelvic pressure and dull back pain.  A watery or bloody mucus discharge that comes from the vagina.  Leaking of amniotic fluid. This is also known as your "water breaking." It could be a slow trickle or a gush. Let your health care provider know if it has a color or strange odor. If you have any of these signs, call your health care provider right away, even if it is before your due date. Follow these instructions at home: Medicines  Follow your health care provider's instructions regarding medicine use. Specific medicines may be either safe or unsafe to take during pregnancy.  Take a prenatal vitamin that contains at least 600 micrograms (mcg) of folic acid.  If you develop constipation, try taking a stool softener if your health care provider approves. Eating and drinking   Eat a balanced diet that includes fresh fruits and vegetables, whole grains, good sources of protein such as meat, eggs, or tofu,  and low-fat dairy. Your health care provider will help you determine the amount of weight gain that is right for you.  Avoid raw meat and uncooked cheese. These carry germs that can cause birth defects in the baby.  If you have low calcium intake from food, talk to your health care provider about whether you should take a daily calcium supplement.  Eat four or five small meals rather than three large meals a day.  Limit foods that are high in fat and processed sugars, such as fried and sweet foods.  To prevent constipation: ? Drink enough fluid to keep your urine clear or pale yellow. ? Eat foods that are high in fiber, such as fresh fruits and vegetables, whole grains, and beans. Activity  Exercise only as directed by your health care provider. Most women can continue their usual exercise routine during pregnancy. Try to exercise for 30 minutes at least 5 days a week. Stop exercising if you experience uterine contractions.  Avoid heavy lifting.  Do   not exercise in extreme heat or humidity, or at high altitudes.  Wear low-heel, comfortable shoes.  Practice good posture.  You may continue to have sex unless your health care provider tells you otherwise. Relieving pain and discomfort  Take frequent breaks and rest with your legs elevated if you have leg cramps or low back pain.  Take warm sitz baths to soothe any pain or discomfort caused by hemorrhoids. Use hemorrhoid cream if your health care provider approves.  Wear a good support bra to prevent discomfort from breast tenderness.  If you develop varicose veins: ? Wear support pantyhose or compression stockings as told by your healthcare provider. ? Elevate your feet for 15 minutes, 3-4 times a day. Prenatal care  Write down your questions. Take them to your prenatal visits.  Keep all your prenatal visits as told by your health care provider. This is important. Safety  Wear your seat belt at all times when driving.  Make  a list of emergency phone numbers, including numbers for family, friends, the hospital, and police and fire departments. General instructions  Avoid cat litter boxes and soil used by cats. These carry germs that can cause birth defects in the baby. If you have a cat, ask someone to clean the litter box for you.  Do not travel far distances unless it is absolutely necessary and only with the approval of your health care provider.  Do not use hot tubs, steam rooms, or saunas.  Do not drink alcohol.  Do not use any products that contain nicotine or tobacco, such as cigarettes and e-cigarettes. If you need help quitting, ask your health care provider.  Do not use any medicinal herbs or unprescribed drugs. These chemicals affect the formation and growth of the baby.  Do not douche or use tampons or scented sanitary pads.  Do not cross your legs for long periods of time.  To prepare for the arrival of your baby: ? Take prenatal classes to understand, practice, and ask questions about labor and delivery. ? Make a trial run to the hospital. ? Visit the hospital and tour the maternity area. ? Arrange for maternity or paternity leave through employers. ? Arrange for family and friends to take care of pets while you are in the hospital. ? Purchase a rear-facing car seat and make sure you know how to install it in your car. ? Pack your hospital bag. ? Prepare the baby's nursery. Make sure to remove all pillows and stuffed animals from the baby's crib to prevent suffocation.  Visit your dentist if you have not gone during your pregnancy. Use a soft toothbrush to brush your teeth and be gentle when you floss. Contact a health care provider if:  You are unsure if you are in labor or if your water has broken.  You become dizzy.  You have mild pelvic cramps, pelvic pressure, or nagging pain in your abdominal area.  You have lower back pain.  You have persistent nausea, vomiting, or diarrhea.   You have an unusual or bad smelling vaginal discharge.  You have pain when you urinate. Get help right away if:  Your water breaks before 37 weeks.  You have regular contractions less than 5 minutes apart before 37 weeks.  You have a fever.  You are leaking fluid from your vagina.  You have spotting or bleeding from your vagina.  You have severe abdominal pain or cramping.  You have rapid weight loss or weight gain.  You have   shortness of breath with chest pain.  You notice sudden or extreme swelling of your face, hands, ankles, feet, or legs.  Your baby makes fewer than 10 movements in 2 hours.  You have severe headaches that do not go away when you take medicine.  You have vision changes. Summary  The third trimester is from week 28 through week 40, months 7 through 9. The third trimester is a time when the unborn baby (fetus) is growing rapidly.  During the third trimester, your discomfort may increase as you and your baby continue to gain weight. You may have abdominal, leg, and back pain, sleeping problems, and an increased need to urinate.  During the third trimester your breasts will keep growing and they will continue to become tender. A yellow fluid (colostrum) may leak from your breasts. This is the first milk you are producing for your baby.  False labor is a condition in which you feel small, irregular tightenings of the muscles in the womb (contractions) that eventually go away. These are called Braxton Hicks contractions. Contractions may last for hours, days, or even weeks before true labor sets in.  Signs of labor can include: abdominal cramps; regular contractions that start at 10 minutes apart and become stronger and more frequent with time; watery or bloody mucus discharge that comes from the vagina; increased pelvic pressure and dull back pain; and leaking of amniotic fluid. This information is not intended to replace advice given to you by your health  care provider. Make sure you discuss any questions you have with your health care provider. Document Released: 11/11/2001 Document Revised: 03/10/2019 Document Reviewed: 12/23/2016 Elsevier Patient Education  2020 [employer]  

## 2019-07-19 NOTE — Progress Notes (Signed)
Routine Prenatal Care Visit  Subjective  Sarah Holland is a 28 y.o. [id number] at [redacted]w[redacted]d being seen today for ongoing prenatal care.  She is currently monitored for the following issues for this high-risk pregnancy and has SIRS (systemic inflammatory response syndrome) ([place]); Supervision of high risk pregnancy, antepartum; and Obesity (BMI 30-39.9) on their problem list.  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 Using a maternity support belt to help. Contractions: Not present. Vag. Bleeding: None.  Movement: Present. No leaking of fluid.  ----------------------------------------------------------------------------------- The following portions of the patient's history were reviewed and updated as appropriate: allergies, current medications, past family history, past medical history, past social history, past surgical history and problem list. Problem list updated.  Objective  Blood pressure 120/80, weight 240 lb (108.9 kg), last menstrual period 12/30/2018. Pregravid weight 213 lb (96.6 kg) Total Weight Gain 27 lb (12.2 kg) Urinalysis: Urine dipstick shows positive for glucose (small), protein (small).  Fetal Status: Fetal Heart Rate (bpm): 144 ([place])   Movement: Present  Presentation: Vertex  General:  Alert, oriented and cooperative. Patient is in no acute distress.  Skin: Skin is warm and dry. No rash noted.   Cardiovascular: Normal heart rate noted  Respiratory: Normal respiratory effort, no problems with respiration noted  Abdomen: Soft, gravid, appropriate for gestational age. Pain/Pressure: Present     Pelvic:  Cervical exam deferred        Extremities: Normal range of motion.     Mental Status: Normal mood and affect. Normal behavior. Normal judgment and thought content.     Assessment   28 y.o. [id number] at [redacted]w[redacted]d, EDD  10/06/2019 by Last Menstrual Period presenting for a routine prenatal visit.  Plan   pregnancy6 Problems (from 12/30/18 to present)    Problem Noted Resolved   Supervision of high risk pregnancy, antepartum 04/05/2019 by Rod Can, CNM No   Overview Addendum 05/16/2019  2:21 PM by Rexene Agent, [place] Prenatal Labs  Dating LMP Blood type: B/Positive/-- (04/22 1503)   Genetic Screen NIPS: Negative XY Antibody:Negative (04/22 1503)  Anatomic [place] Complete 05/16/2019 Rubella: 3.96 (04/22 1503) Varicella: Immune  GTT Early:               Third trimester:  RPR: Non Reactive (04/22 1503)   Rhogam N/A HBsAg: Negative (04/22 1503)   TDaP vaccine                       Flu Shot: HIV: Non Reactive (04/22 1503)   Baby Food                                GBS:   Contraception  Pap: 03/23/2019, NILM  CBB     CS/VBAC    Support Person               Growth scan today shows fetal growth at 50.1%, EFW 2 lb, 12 oz,  FHR 742 bpm, cephalic presentation, AFI 13.5 cm. Results reviewed with patient.  3 hour GTT done today.  Preterm labor symptoms and general obstetric precautions including but not limited to vaginal bleeding, contractions, leaking of fluid and fetal movement were reviewed.  Please refer to After Visit Summary for other counseling recommendations.   Return in about 2 weeks (around 08/02/2019) for ROB.  Sarah Holland, CNM 07/19/2019  10:04 AM

## 2019-07-20 ENCOUNTER — Other Ambulatory Visit: Payer: Self-pay | Admitting: Certified Nurse Midwife

## 2019-07-20 DIAGNOSIS — O24419 Gestational diabetes mellitus in pregnancy, unspecified control: Secondary | ICD-10-CM

## 2019-07-20 LAB — GESTATIONAL GLUCOSE TOLERANCE
Glucose, Fasting: 98 mg/dL — ABNORMAL HIGH (ref 65–94)
Glucose, GTT - 1 Hour: 219 mg/dL — ABNORMAL HIGH (ref 65–179)
Glucose, GTT - 2 Hour: 204 mg/dL — ABNORMAL HIGH (ref 65–154)
Glucose, GTT - 3 Hour: 140 mg/dL — ABNORMAL HIGH (ref 65–139)

## 2019-07-21 ENCOUNTER — Encounter: Payer: 59 | Admitting: Obstetrics & Gynecology

## 2019-07-21 ENCOUNTER — Other Ambulatory Visit: Payer: 59

## 2019-07-25 ENCOUNTER — Encounter: Payer: Self-pay | Admitting: *Deleted

## 2019-07-25 ENCOUNTER — Encounter: Payer: 59 | Attending: Certified Nurse Midwife | Admitting: *Deleted

## 2019-07-25 ENCOUNTER — Other Ambulatory Visit: Payer: Self-pay

## 2019-07-25 VITALS — BP 100/66 | Ht 65.0 in | Wt 244.3 lb

## 2019-07-25 DIAGNOSIS — Z3A00 Weeks of gestation of pregnancy not specified: Secondary | ICD-10-CM | POA: Insufficient documentation

## 2019-07-25 DIAGNOSIS — O24410 Gestational diabetes mellitus in pregnancy, diet controlled: Secondary | ICD-10-CM

## 2019-07-25 DIAGNOSIS — O24419 Gestational diabetes mellitus in pregnancy, unspecified control: Secondary | ICD-10-CM | POA: Insufficient documentation

## 2019-07-25 NOTE — Progress Notes (Signed)
Diabetes Self-Management Education  Visit Type: First/Initial  Appt. Start Time: 0855 Appt. End Time: 5997  07/25/2019  [name], identified by name and date of birth, is a 28 y.o. female with a diagnosis of Diabetes: Gestational Diabetes.   ASSESSMENT  Blood pressure 100/66, height _0  (1.651 m), weight 244 lb 4.8 oz (110.8 kg), last menstrual period 12/30/2018, estimated date of delivery 10/06/2019 Body mass index is 40.65 kg/m.  Diabetes Self-Management Education - 07/25/19 0955      Visit Information   Visit Type  First/Initial      Initial Visit   Diabetes Type  Gestational Diabetes    Are you currently following a meal plan?  No   recently stopped drinking regular sodas   Are you taking your medications as prescribed?  Yes    Date Diagnosed  1 week      Health Coping   How would you rate your overall health?  Excellent      Psychosocial Assessment   Patient Belief/Attitude about Diabetes  Other (comment)   "I am fine with it"   Self-care barriers  None    Self-management support  Doctor's office    Patient Concerns  Nutrition/Meal planning;Healthy Lifestyle    Special Needs  None    Preferred Learning Style  Visual;Hands on    Learning Readiness  Change in progress    How often do you need to have someone help you when you read instructions, pamphlets, or other written materials from your doctor or pharmacy?  1 - Never    What is the last grade level you completed in school?  Associates      Pre-Education Assessment   Patient understands the diabetes disease and treatment process.  Needs Instruction    Patient understands incorporating nutritional management into lifestyle.  Needs Instruction    Patient undertands incorporating physical activity into lifestyle.  Needs Instruction    Patient understands using medications safely.  Needs Instruction    Patient understands monitoring blood glucose, interpreting and using results  Needs Instruction    Patient understands prevention, detection, and treatment of acute complications.  Needs Instruction    Patient understands prevention, detection, and treatment of chronic complications.  Needs Instruction    Patient understands how to develop strategies to address psychosocial issues.  Needs Instruction    Patient understands how to develop strategies to promote health/change behavior.  Needs Instruction      Complications   How often do you check your blood sugar?  0 times/day (not testing)   Provided OneTouch Verio Flex meter and instructed on use. BG upon return demonstration was 141 mg/dL at 10:10 am - 4 hours after drinking apple juice for breakfast.   Have you had a dilated eye exam in the past 12 months?  No    Have you had a dental exam in the past 12 months?  No    Are you checking your feet?  No      Dietary Intake   Breakfast  works 8:30 pm - 5:00 am - doesn't eat breakfast    Snack (morning)  0-1 snack/day - apple peanut butter crackers    Lunch  6:30 pm - usually gets take out - [employer] (chicken sub) or [employer] (chicken sandwich with fries or mac-n-cheese)    Dinner  1:00 am - eats salad from [employer] (grilled chicken and pita bread) or [employer] (southwest salad - chicken and chips)  - also eats potatoes, pinto beans, rice,  corn, tomatoes, cuccumbers, peppers    Beverage(s)  water, juice      Exercise   Exercise Type  ADL's      Patient Education   Previous Diabetes Education  No    Disease state   Definition of diabetes, type 1 and 2, and the diagnosis of diabetes;Factors that contribute to the development of diabetes    Nutrition management   Role of diet in the treatment of diabetes and the relationship between the three main macronutrients and blood glucose level;Food label reading, portion sizes and measuring food.;Reviewed blood glucose goals for pre and post meals and how to evaluate the patients' food intake on their blood glucose level.    Physical activity and  exercise   Role of exercise on diabetes management, blood pressure control and cardiac health.    Medications  Other (comment)   potential for medications if BG not in good control   Chronic complications  Relationship between chronic complications and blood glucose control    Psychosocial adjustment  Identified and addressed patients feelings and concerns about diabetes    Preconception care  Pregnancy and GDM  Role of pre-pregnancy blood glucose control on the development of the fetus;Reviewed with patient blood glucose goals with pregnancy;Role of family planning for patients with diabetes      Individualized Goals (developed by patient)   Reducing Risk  Lead a healthier lifestyle     Outcomes   Expected Outcomes  Demonstrated interest in learning. Expect positive outcomes       Individualized Plan for Diabetes Self-Management Training:   Learning Objective:  Patient will have a greater understanding of diabetes self-management. Patient education plan is to attend individual and/or group sessions per assessed needs and concerns.   Plan:   Patient Instructions  Read booklet on Gestational Diabetes Follow Gestational Meal Planning Guidelines Don't skip meals Limit desserts/sweets and fried foods Avoid fruit juice Complete a 3 Day Food Record and bring to next appointment Check blood sugars 4 x day - before breakfast and 2 hrs after every meal and record  Bring blood sugar log to all appointments Call MD for prescription for meter strips and lancets Strips One Touch Verio Lancets   One Touch Delica Purchase urine ketone strips if ordered by MD and check urine ketones every am:  If + increase bedtime snack to 1 protein and 2 carbohydrate servings Walk 20-30 minutes at least 5 x week if permitted by MD  Expected Outcomes:  Demonstrated interest in learning. Expect positive outcomes  Education material provided: Gestational Booklet Gestational Meal Planning Guidelines Simple  Meal Plan Viewed Gestational Diabetes Video Meter = One Touch Verio Flex 3 Day Food Record Goals for a Healthy Pregnancy  If problems or questions, patient to contact team via:  Johny Drilling, RN, [place], CDE [phone]  Future DSME appointment:  August 02, 2019 with the dieitian

## 2019-07-25 NOTE — Patient Instructions (Signed)
Read booklet on Gestational Diabetes Follow Gestational Meal Planning Guidelines Don't skip meals Limit desserts/sweets and fried foods Avoid fruit juice Complete a 3 Day Food Record and bring to next appointment Check blood sugars 4 x day - before breakfast and 2 hrs after every meal and record  Bring blood sugar log to all appointments Call MD for prescription for meter strips and lancets Strips One Touch Verio Lancets   One Touch Delica Purchase urine ketone strips if ordered by MD and check urine ketones every am:  If + increase bedtime snack to 1 protein and 2 carbohydrate servings Walk 20-30 minutes at least 5 x week if permitted by MD

## 2019-07-26 ENCOUNTER — Telehealth: Payer: Self-pay

## 2019-07-26 ENCOUNTER — Other Ambulatory Visit: Payer: Self-pay | Admitting: Certified Nurse Midwife

## 2019-07-26 MED ORDER — GLUCOSE BLOOD VI STRP: ORAL_STRIP | 5 refills | Status: DC

## 2019-07-26 MED ORDER — ONETOUCH ULTRASOFT LANCETS MISC: 5 refills | Status: DC

## 2019-07-26 NOTE — Telephone Encounter (Signed)
RX called in .

## 2019-07-26 NOTE — Telephone Encounter (Signed)
Pt is calling to let us know that she does not have a prescription for the one touch lancets and test strips. She would like those called into the [facility] on [address]. Thank you

## 2019-08-02 ENCOUNTER — Ambulatory Visit (INDEPENDENT_AMBULATORY_CARE_PROVIDER_SITE_OTHER): Payer: 59 | Admitting: Certified Nurse Midwife

## 2019-08-02 ENCOUNTER — Encounter: Payer: 59 | Admitting: Certified Nurse Midwife

## 2019-08-02 ENCOUNTER — Encounter: Payer: Self-pay | Admitting: Dietician

## 2019-08-02 ENCOUNTER — Encounter: Payer: 59 | Attending: Certified Nurse Midwife | Admitting: Dietician

## 2019-08-02 ENCOUNTER — Other Ambulatory Visit: Payer: Self-pay

## 2019-08-02 VITALS — BP 120/70 | Wt 242.4 lb

## 2019-08-02 VITALS — BP 110/70 | Ht 65.0 in | Wt 241.7 lb

## 2019-08-02 DIAGNOSIS — Z3A30 30 weeks gestation of pregnancy: Secondary | ICD-10-CM

## 2019-08-02 DIAGNOSIS — O24419 Gestational diabetes mellitus in pregnancy, unspecified control: Secondary | ICD-10-CM | POA: Diagnosis present

## 2019-08-02 DIAGNOSIS — O26843 Uterine size-date discrepancy, third trimester: Secondary | ICD-10-CM

## 2019-08-02 DIAGNOSIS — O0993 Supervision of high risk pregnancy, unspecified, third trimester: Secondary | ICD-10-CM

## 2019-08-02 DIAGNOSIS — O24410 Gestational diabetes mellitus in pregnancy, diet controlled: Secondary | ICD-10-CM

## 2019-08-02 DIAGNOSIS — Z23 Encounter for immunization: Secondary | ICD-10-CM

## 2019-08-02 DIAGNOSIS — Z3A00 Weeks of gestation of pregnancy not specified: Secondary | ICD-10-CM | POA: Insufficient documentation

## 2019-08-02 DIAGNOSIS — Z8632 Personal history of gestational diabetes: Secondary | ICD-10-CM | POA: Insufficient documentation

## 2019-08-02 DIAGNOSIS — O24414 Gestational diabetes mellitus in pregnancy, insulin controlled: Secondary | ICD-10-CM | POA: Insufficient documentation

## 2019-08-02 DIAGNOSIS — O0990 Supervision of high risk pregnancy, unspecified, unspecified trimester: Secondary | ICD-10-CM

## 2019-08-02 LAB — POCT URINALYSIS DIPSTICK OB: Glucose, UA: NEGATIVE

## 2019-08-02 NOTE — Progress Notes (Signed)
Diabetes Self-Management Education  Visit Type:  Follow-up  Appt. Start Time: 11:00am  Appt. End Time: 1200  08/02/2019  [name], identified by name and date of birth, is a 28 y.o. female with a diagnosis of Diabetes:  .Gestational Diabetes   ASSESSMENT  Blood pressure 110/70, height 5\' 5"  (1.651 m), weight 241 lb 11.2 oz (109.6 kg), last menstrual period 12/30/2018. Body mass index is 40.22 kg/m.   Diabetes Self-Management Education - 95/28/41 3244      Complications   How often do you check your blood sugar?  > 4 times/day    Fasting Blood glucose range (mg/dL)  70-129    Postprandial Blood glucose range (mg/dL)  70-129;130-179    Have you had a dilated eye exam in the past 12 months?  No    Have you had a dental exam in the past 12 months?  No    Are you checking your feet?  No      Dietary Intake   Breakfast  2:30pm after waking up- 1 pkg oatmeal, water  (Works from 8:30pm-5:00pm)    Lunch  5:30pm-all of these meals are "out" or "take out"- [employer] sandwich, cup of fruit, water    Snack (afternoon)  8:00pm- apple or peanuts    Dinner  1-2:00am- [place] chicken salad with pita bread or Southwest salad    Snack (evening)  5:30-6:00 am (before going to bed) apple and peanut butter    Beverage(s)  water      Exercise   Exercise Type  ADL's      Post-Education Assessment   Patient understands the diabetes disease and treatment process.  Demonstrates understanding / competency    Patient understands incorporating nutritional management into lifestyle.  Demonstrates understanding / competency    Patient undertands incorporating physical activity into lifestyle.  Demonstrates understanding / competency    Patient understands monitoring blood glucose, interpreting and using results  Demonstrates understanding / competency    Patient understands prevention, detection, and treatment of acute complications.  Demonstrates understanding / competency    Patient understands  how to develop strategies to promote health/change behavior.  Demonstrates understanding / competency      Outcomes   Program Status  Completed       Learning Objective:  Patient will have a greater understanding of diabetes self-management. Patient education plan is to attend individual and/or group sessions per assessed needs and concerns. Education: Instructed patient on a meal plan for gestational diabetes including carbohydrate counting and how to better balance carbohydrate, protein and non-starchy vegetables. Gave and reviewed "Quick and Healthy meals" suggesting to prepare some simple meals at home to be able to better control portions, carbohydrate and fat. Instructed on food safety including prevention of listeriosis and food borne illness as well as recommendations regarding fish consumption. Also gave and reviewed Diabetes Prevention including  recommendations for annual glucose labs as well as importance of preconception care related to glucose control.   Patient Instructions  Continue to check blood sugars before and after meals. Balance meals with 2-4 oz protein and 2-3 servings of carbohydrate and add non-starchy vegetables as much as possible. Include a snack with 1 serving of carbohydrate and 1-2 oz protein. Use calorieking.com to look up nutrition information for foods eaten "out". Try to prepare some simple meals at home. Refer to "Quick and [employer]" handout.    Expected Outcomes:  Demonstrated interest in learning. Expect positive outcomes  Education material provided: Agricultural consultant  a Balanced Meal Food Safety Diabetes Prevention Quick and Healthy Meals If problems or questions, patient to contact team via:   Darrel Reacholleen Venora Kautzman, RD, CDE  [phone] Future DSME appointment: - PRN.

## 2019-08-02 NOTE — Patient Instructions (Signed)
Continue to check blood sugars before and after meals. Balance meals with 2-4 oz protein and 2-3 servings of carbohydrate and add non-starchy vegetables as much as possible. Include a snack with 1 serving of carbohydrate and 1-2 oz protein. Use calorieking.com to look up nutrition information for foods eaten "out". Try to prepare some simple meals at home. Refer to "Quick and [employer]" handout.

## 2019-08-02 NOTE — Progress Notes (Signed)
HROB at 30wk5d: Baby active. Having some MSK pain in sacral area/ SI joints. Had an abnormal 3 hour GTT and has GDM. Went to the first Lifestyles visit and has been checking blood sugars. Sees the nutritionist to discuss diet today at 11 Works night shift from [place] usually lunch is at 530 PM and supper at 1230 AM. Has oatmeal for breakfast around 2PM when she awakes. Has snacks at 430 PM, 8PM, and 5AM All fasting blood sugars (7 of7) are elevated range from 108 to 120. 2 hour pp after breakfast: 3/7>120, range 86-168; 2hour pp after lunch: 2/7>120, range 93-127; 2 hour pp after supper: 4/5 >120, range 117-177 Advised to ROB in 1 week to see MD regarding blood sugars TDAP today/ Signed blood transfusion consent. Growth scan in 2 weeks Dalia Heading, CNM

## 2019-08-02 NOTE — Progress Notes (Signed)
C/o having a lot of back pain - wears belly band at work, has pains that comes and goes.rj

## 2019-08-09 ENCOUNTER — Other Ambulatory Visit: Payer: Self-pay

## 2019-08-09 ENCOUNTER — Ambulatory Visit (INDEPENDENT_AMBULATORY_CARE_PROVIDER_SITE_OTHER): Payer: 59 | Admitting: Obstetrics and Gynecology

## 2019-08-09 VITALS — BP 130/72 | Wt 245.0 lb

## 2019-08-09 DIAGNOSIS — O99210 Obesity complicating pregnancy, unspecified trimester: Secondary | ICD-10-CM | POA: Insufficient documentation

## 2019-08-09 DIAGNOSIS — O0993 Supervision of high risk pregnancy, unspecified, third trimester: Secondary | ICD-10-CM

## 2019-08-09 DIAGNOSIS — O99213 Obesity complicating pregnancy, third trimester: Secondary | ICD-10-CM

## 2019-08-09 DIAGNOSIS — O24414 Gestational diabetes mellitus in pregnancy, insulin controlled: Secondary | ICD-10-CM

## 2019-08-09 DIAGNOSIS — O0990 Supervision of high risk pregnancy, unspecified, unspecified trimester: Secondary | ICD-10-CM

## 2019-08-09 DIAGNOSIS — Z3A31 31 weeks gestation of pregnancy: Secondary | ICD-10-CM

## 2019-08-09 HISTORY — DX: Obesity complicating pregnancy, unspecified trimester: O99.210

## 2019-08-09 LAB — POCT URINALYSIS DIPSTICK OB
Glucose, UA: NEGATIVE
POC,PROTEIN,UA: NEGATIVE

## 2019-08-09 MED ORDER — INSULIN LISPRO (1 UNIT DIAL) 100 UNIT/ML (KWIKPEN): 4.0000 [IU] | PEN_INJECTOR | Freq: Three times a day (TID) | SUBCUTANEOUS | 11 refills | Status: DC

## 2019-08-09 MED ORDER — INSULIN GLARGINE 100 UNIT/ML SOLOSTAR PEN: 20.0000 [IU] | PEN_INJECTOR | Freq: Every day | SUBCUTANEOUS | 11 refills | Status: DC

## 2019-08-09 MED ORDER — INSULIN PEN NEEDLE 32G X 4 MM MISC: 1.0000 [IU] | Freq: Four times a day (QID) | 1 refills | Status: DC

## 2019-08-09 NOTE — Progress Notes (Signed)
Routine Prenatal Care Visit  Subjective  Sarah Holland is a 28 y.o. [id number] at 579w5d being seen today for ongoing prenatal care.  She is currently monitored for the following issues for this high-risk pregnancy and has SIRS (systemic inflammatory response syndrome) (HCC); Supervision of high risk pregnancy, antepartum; Obesity (BMI 30-39.9); Insulin controlled gestational diabetes mellitus (GDM) in third trimester; and Obesity complicating peripregnancy, antepartum on their problem list.  ----------------------------------------------------------------------------------- Patient reports no complaints.   Contractions: Not present. Vag. Bleeding: None.  Movement: Present. Denies leaking of fluid.  ----------------------------------------------------------------------------------- The following portions of the patient's history were reviewed and updated as appropriate: allergies, current medications, past family history, past medical history, past social history, past surgical history and problem list. Problem list updated.   Objective  Blood pressure 130/72, weight 245 lb (111.1 kg), last menstrual period 12/30/2018. Pregravid weight 213 lb (96.6 kg) Total Weight Gain 32 lb (14.5 kg)  Body mass index is 40.77 kg/m.  Urinalysis:      Fetal Status: Fetal Heart Rate (bpm): 150 Fundal Height: 34 cm Movement: Present  Presentation: Vertex  General:  Alert, oriented and cooperative. Patient is in no acute distress.  Skin: Skin is warm and dry. No rash noted.   Cardiovascular: Normal heart rate noted  Respiratory: Normal respiratory effort, no problems with respiration noted  Abdomen: Soft, gravid, appropriate for gestational age. Pain/Pressure: Present     Pelvic:  Cervical exam deferred        Extremities: Normal range of motion.     ental Status: Normal mood and affect. Normal behavior. Normal judgment and thought content.     Assessment   28 y.o. [id number] at 5479w5d by  10/06/2019,  by Last Menstrual Period presenting for routine prenatal visit  Plan   pregnancy6 Problems (from 12/30/18 to present)    Problem Noted Resolved   Supervision of high risk pregnancy, antepartum 04/05/2019 by Tresea MallGledhill, Jane, CNM No   Overview Addendum 08/09/2019 12:30 AM by Farrel ConnersGutierrez, Colleen, CNM    Clinic [facility] Prenatal Labs  Dating LMP Blood type: B/Positive/-- (04/22 1503)   Genetic Screen NIPS: Negative XY Antibody:Negative (04/22 1503)  Anatomic US Complete 05/16/2019 Rubella: 3.96 (04/22 1503) Varicella: Immune  GTT Early:               Third trimester: 204; 3 hr 98/219/204/140- RPR: Non Reactive (04/22 1503)   Rhogam N/A HBsAg: Negative (04/22 1503)   TDaP vaccine        08/02/19               Flu Shot: HIV: Non Reactive (04/22 1503)   Baby Food                                GBS:   Contraception  Pap: 03/23/2019, NILM  CBB     CS/VBAC    Support Person                  Gestational age appropriate obstetric precautions including but not limited to vaginal bleeding, contractions, leaking of fluid and fetal movement were reviewed in detail with the patient.    1) GDM Meal time values relatively well controlled with exception of a few outliers.  Fasting values consistently elevated.   - start insulin Lantus (glargine) 20 units at bedtime, Lispro 4 units AC tid - start NST/AFI surveillance - growth scan next visit  - Discussed ADA diet, in  addition for obese patient diagnosed prior to 20 weeks recommend caloric restriction to 25 Kcal/kg/day ideal body weight.  Carbohydrates should be limited to 33-40% of daily caloric intake with attention given to high glycemic index vs low glycemic index foods.  The remaining calories should be split approximately 20% protein, 40% fats.  Three meals a day with two snacks to spread carbohdyrate load was recommended. - Recommend lifestyles referral for more in depth diet teaching (done) - Home glucose monitoring 4 times a day (AM fasting goal <100,  2-hr postprandial <120).  Importance in maintaining glucose log discussed as proper management will rely on assessing home BG readings.  Stressed team approach with patient being a vital member of this team in ensuring best possible outcome for her fetus. - If good control twice weekly follow up appopriate - Antepartum testing indicated at 32-34 weeks if on meds for glycemic control, monthly growth scan starting at 28 weeks - For diet controlled gestational diabetes induction by 40 weeks if favorable cervix, delivery at latest by 41 weeks - Evidence of fetal macrosomia or poor control may necessitate earlier delivery, 39 weeks or less.  If fetal weight >4500g counseling regarding risk of shoulder dystocia and consideration of C-section discussed. - If pharmacotherapy should need to be started insulin remains the ADA recommended treatment.  Metformin may be used but this indication is not FDA approved, it readily crosses the placenta and while 2-year follow up data have not shown significant developmental differences long term follow up data is unavailable.  Glyburide may be used as a second line oral agent but is not superior to insulin in preventing macrosomia, may be associated with increased rates of neonatal hypyoglycemia, and is not FDA approved for this indication.  - Stressed that diabetes is a common complicating factor in pregnancy affecting about 7% of pregnancies - Managed properly and with patient adherence to monitoring, diet and lifestyle changes does not have to be associated with adverse pregnancy outcome.  However, uncontrolled may lead to adverse outcomes including still birth, shoulder dystocia, need for cesarean delivery. - Increased risk of preeclampsia in patient diagnosed with gestational diabetes discussed - Increased lifetime risk of develop Type II diabetes discussed  ACOG Practive Bulletin 190 "Gestatioal Diabetes" Updated Februrary 2018    Return in about 1 week (around  08/16/2019) for ROB, NST. growth scan.  Malachy Mood, MD, [facility], [facility] 08/09/2019, 5:50 PM

## 2019-08-09 NOTE — Progress Notes (Signed)
ROB

## 2019-08-16 ENCOUNTER — Other Ambulatory Visit: Payer: Self-pay

## 2019-08-16 ENCOUNTER — Ambulatory Visit (INDEPENDENT_AMBULATORY_CARE_PROVIDER_SITE_OTHER): Payer: 59 | Admitting: Obstetrics and Gynecology

## 2019-08-16 ENCOUNTER — Ambulatory Visit (INDEPENDENT_AMBULATORY_CARE_PROVIDER_SITE_OTHER): Payer: 59

## 2019-08-16 ENCOUNTER — Encounter: Payer: Self-pay | Admitting: Obstetrics and Gynecology

## 2019-08-16 VITALS — BP 128/76 | Wt 243.0 lb

## 2019-08-16 DIAGNOSIS — O0993 Supervision of high risk pregnancy, unspecified, third trimester: Secondary | ICD-10-CM

## 2019-08-16 DIAGNOSIS — O99210 Obesity complicating pregnancy, unspecified trimester: Secondary | ICD-10-CM

## 2019-08-16 DIAGNOSIS — O99213 Obesity complicating pregnancy, third trimester: Secondary | ICD-10-CM

## 2019-08-16 DIAGNOSIS — O0990 Supervision of high risk pregnancy, unspecified, unspecified trimester: Secondary | ICD-10-CM

## 2019-08-16 DIAGNOSIS — Z362 Encounter for other antenatal screening follow-up: Secondary | ICD-10-CM | POA: Diagnosis not present

## 2019-08-16 DIAGNOSIS — O24414 Gestational diabetes mellitus in pregnancy, insulin controlled: Secondary | ICD-10-CM

## 2019-08-16 DIAGNOSIS — O26843 Uterine size-date discrepancy, third trimester: Secondary | ICD-10-CM

## 2019-08-16 DIAGNOSIS — O24419 Gestational diabetes mellitus in pregnancy, unspecified control: Secondary | ICD-10-CM

## 2019-08-16 DIAGNOSIS — Z3A32 32 weeks gestation of pregnancy: Secondary | ICD-10-CM

## 2019-08-16 NOTE — Progress Notes (Signed)
Routine Prenatal Care Visit  Subjective  Sarah Holland is a 28 y.o. [phone] at [redacted]w[redacted]d being seen today for ongoing prenatal care.  She is currently monitored for the following issues for this high-risk pregnancy and has SIRS (systemic inflammatory response syndrome) ([place]); Supervision of high risk pregnancy, antepartum; Obesity (BMI 30-39.9); Insulin controlled gestational diabetes mellitus (GDM) in third trimester; and Obesity complicating peripregnancy, antepartum on their problem list.  ----------------------------------------------------------------------------------- Patient reports no complaints.   Contractions: Not present. Vag. Bleeding: None.  Movement: Present. Leaking Fluid denies.  GDMA2: most fasting values elevated.  About 1/2 2h pp values elevated. Patient reports no hypoglycemic episodes and no issues taking insulin. Growth u/s today: 38th %ile, afi nml     ----------------------------------------------------------------------------------- The following portions of the patient's history were reviewed and updated as appropriate: allergies, current medications, past family history, past medical history, past social history, past surgical history and problem list. Problem list updated.  Objective  Blood pressure 128/76, weight 243 lb (110.2 kg), last menstrual period 12/30/2018. Pregravid weight 213 lb (96.6 kg) Total Weight Gain 30 lb (13.6 kg) Urinalysis: Urine Protein    Urine Glucose    Fetal Status: Fetal Heart Rate (bpm): 140   Movement: Present  Presentation: Vertex  General:  Alert, oriented and cooperative. Patient is in no acute distress.  Skin: Skin is warm and dry. No rash noted.   Cardiovascular: Normal heart rate noted  Respiratory: Normal respiratory effort, no problems with respiration noted  Abdomen: Soft, gravid, appropriate for gestational age. Pain/Pressure: Present     Pelvic:  Cervical exam deferred        Extremities: Normal range of motion.      Mental Status: Normal mood and affect. Normal behavior. Normal judgment and thought content.   NST Baseline FHR: 140 beats/min Variability: moderate Accelerations: present Decelerations: absent Tocometry: not done  Interpretation:  INDICATIONS: gestational diabetes mellitus RESULTS:  A NST procedure was performed with FHR monitoring and a normal baseline established, appropriate time of 20-40 minutes of evaluation, and accels >2 seen w 15x15 characteristics.  Results show a REACTIVE NST.    Imaging Results US Ob Follow Up  Result Date: 08/16/2019 Patient Name: CING Pony DOB: 1991-06-17 MRN: [id number] ULTRASOUND REPORT Location: [facility] Date of Service: 08/16/2019 Indications:growth/afi Findings: Nelda [place] intrauterine pregnancy is visualized with FHR at 154 BPM. Biometrics give an (U/S) Gestational age of [redacted]w[redacted]d and an (U/S) EDD of 10/08/2019; this correlates with the clinically established Estimated Date of Delivery: 10/06/19. Fetal presentation is Cephalic. Placenta: posterior. Grade: 2 AFI: 14.4 cm Growth percentile is 38.2 (AC measuring 1 week behind). EFW: 1,936g (4lbs 4oz) Impression: 1. [redacted]w[redacted]d Viable Singleton Intrauterine pregnancy previously established criteria. 2. Growth is 38.2 %ile.  AFI is 14.4 cm. Vita Barley, RT The ultrasound images and findings were reviewed by me and I agree with the above report. Prentice Docker, MD, [facility], [facility] 08/16/2019 10:59 AM       Assessment   28 y.o. [id number] at [redacted]w[redacted]d by  10/06/2019, by Last Menstrual Period presenting for routine prenatal visit  Plan   pregnancy6 Problems (from 12/30/18 to present)    Problem Noted Resolved   Obesity complicating peripregnancy, antepartum 08/09/2019 by Malachy Mood, MD No   Insulin controlled gestational diabetes mellitus (GDM) in third trimester 08/02/2019 by Dalia Heading, CNM No   Overview Addendum 08/16/2019 11:26 AM by Will Bonnet, MD    Lantus  20 units bedtime start 9/8  *  increased to 25 units 9/15 Lispro 4 units AC tid start 9/8      Supervision of high risk pregnancy, antepartum 04/05/2019 by Tresea MallGledhill, Jane, CNM No   Overview Addendum 08/09/2019 12:30 AM by Farrel ConnersGutierrez, Colleen, CNM    Clinic [facility] Prenatal Labs  Dating LMP Blood type: B/Positive/-- (04/22 1503)   Genetic Screen NIPS: Negative XY Antibody:Negative (04/22 1503)  Anatomic US Complete 05/16/2019 Rubella: 3.96 (04/22 1503) Varicella: Immune  GTT Early:               Third trimester: 204; 3 hr 98/219/204/140- RPR: Non Reactive (04/22 1503)   Rhogam N/A HBsAg: Negative (04/22 1503)   TDaP vaccine        08/02/19               Flu Shot: HIV: Non Reactive (04/22 1503)   Baby Food                                GBS:   Contraception  Pap: 03/23/2019, NILM  CBB     CS/VBAC    Support Person                 Preterm labor symptoms and general obstetric precautions including but not limited to vaginal bleeding, contractions, leaking of fluid and fetal movement were reviewed in detail with the patient. Please refer to After Visit Summary for other counseling recommendations.   Return in about 1 week (around 08/23/2019) for Routine Prenatal Appointment/NST.  Thomasene MohairStephen Jackson, MD, Merlinda FrederickFACOG [facility], [facility] 08/16/2019 11:26 AM

## 2019-08-22 ENCOUNTER — Encounter: Payer: Self-pay | Admitting: Advanced Practice Midwife

## 2019-08-22 ENCOUNTER — Ambulatory Visit (INDEPENDENT_AMBULATORY_CARE_PROVIDER_SITE_OTHER): Payer: 59 | Admitting: Advanced Practice Midwife

## 2019-08-22 ENCOUNTER — Other Ambulatory Visit: Payer: Self-pay

## 2019-08-22 VITALS — BP 124/80 | Wt 248.0 lb

## 2019-08-22 DIAGNOSIS — O0993 Supervision of high risk pregnancy, unspecified, third trimester: Secondary | ICD-10-CM

## 2019-08-22 DIAGNOSIS — Z3A33 33 weeks gestation of pregnancy: Secondary | ICD-10-CM

## 2019-08-22 DIAGNOSIS — O24414 Gestational diabetes mellitus in pregnancy, insulin controlled: Secondary | ICD-10-CM | POA: Diagnosis not present

## 2019-08-22 DIAGNOSIS — O0990 Supervision of high risk pregnancy, unspecified, unspecified trimester: Secondary | ICD-10-CM

## 2019-08-22 NOTE — Progress Notes (Signed)
Routine Prenatal Care Visit  Subjective  Sarah Holland is a 28 y.o. [phone] at [redacted]w[redacted]d being seen today for ongoing prenatal care.  She is currently monitored for the following issues for this high-risk pregnancy and has SIRS (systemic inflammatory response syndrome) (HCC); Supervision of high risk pregnancy, antepartum; Obesity (BMI 30-39.9); Insulin controlled gestational diabetes mellitus (GDM) in third trimester; and Obesity complicating peripregnancy, antepartum on their problem list.  ----------------------------------------------------------------------------------- Patient reports a crampy contraction some days. Discussed blood sugar log with Dr Tiburcio Pea and recommendations to raise insulin amounts. Contractions: Not present. Vag. Bleeding: None.  Movement: Present. Leaking Fluid denies.  ----------------------------------------------------------------------------------- The following portions of the patient's history were reviewed and updated as appropriate: allergies, current medications, past family history, past medical history, past social history, past surgical history and problem list. Problem list updated.  Objective  Blood pressure 124/80, weight 248 lb (112.5 kg), last menstrual period 12/30/2018. Pregravid weight 213 lb (96.6 kg) Total Weight Gain 35 lb (15.9 kg) Urinalysis: Urine Protein    Urine Glucose    Fetal Status: Fetal Heart Rate (bpm): 130   Movement: Present      NST: reactive 20 minute tracing, baseline 130 bpm, moderate variability, +accelerations to 160, -decelerations  Blood sugar log: 5/6 fasting elevated around 100 to 105, 2-3 elevated after each meal to 150s (one 200 after [employer])  General:  Alert, oriented and cooperative. Patient is in no acute distress.  Skin: Skin is warm and dry. No rash noted.   Cardiovascular: Normal heart rate noted  Respiratory: Normal respiratory effort, no problems with respiration noted  Abdomen: Soft, gravid, appropriate  for gestational age. Pain/Pressure: Present     Pelvic:  Cervical exam deferred        Extremities: Normal range of motion.     Mental Status: Normal mood and affect. Normal behavior. Normal judgment and thought content.   Assessment   28 y.o. [id number] at [redacted]w[redacted]d by  10/06/2019, by Last Menstrual Period presenting for routine prenatal visit  Plan   pregnancy6 Problems (from 12/30/18 to present)    Problem Noted Resolved   Obesity complicating peripregnancy, antepartum 08/09/2019 by Vena Austria, MD No   Insulin controlled gestational diabetes mellitus (GDM) in third trimester 08/02/2019 by Farrel Conners, CNM No   Overview Addendum 08/16/2019 11:26 AM by Conard Novak, MD    Lantus 20 units bedtime start 9/8  * increased to 25 units 9/15 Lispro 4 units AC tid start 9/8      Supervision of high risk pregnancy, antepartum 04/05/2019 by Tresea Mall, CNM No   Overview Addendum 08/09/2019 12:30 AM by Farrel Conners, CNM    Clinic [facility] Prenatal Labs  Dating LMP Blood type: B/Positive/-- (04/22 1503)   Genetic Screen NIPS: Negative XY Antibody:Negative (04/22 1503)  Anatomic [place] Complete 05/16/2019 Rubella: 3.96 (04/22 1503) Varicella: Immune  GTT Early:               Third trimester: 204; 3 hr 98/219/204/140- RPR: Non Reactive (04/22 1503)   Rhogam N/A HBsAg: Negative (04/22 1503)   TDaP vaccine        08/02/19               Flu Shot: HIV: Non Reactive (04/22 1503)   Baby Food                                GBS:   Contraception  Pap: 03/23/2019,  NILM  CBB     CS/VBAC    Support Person                  Preterm labor symptoms and general obstetric precautions including but not limited to vaginal bleeding, contractions, leaking of fluid and fetal movement were reviewed in detail with the patient. Please refer to After Visit Summary for other counseling recommendations.   Raise long acting insulin to 30 units and before meals to 5 units  Return in about 1 week (around  08/29/2019) for nst/rob.  Rod Can, CNM 08/22/2019 10:22 AM

## 2019-08-22 NOTE — Progress Notes (Signed)
No vb. No lof. Braxton hicks starting.

## 2019-08-22 NOTE — Patient Instructions (Signed)
Gestational Diabetes Mellitus, Self Care Caring for yourself after you have been diagnosed with gestational diabetes (gestational diabetes mellitus) means keeping your blood sugar (glucose) under control. You can do that with a balance of:  Nutrition.  Exercise.  Lifestyle changes.  Medicines or insulin, if necessary.  Support from your team of health care providers and others. The following information explains what you need to know to manage your gestational diabetes at home. What are the risks? If gestational diabetes is treated, it is unlikely to cause problems. If it is not controlled with treatment, it may cause problems during labor and delivery, and some of those problems can be harmful to the unborn baby (fetus) and the mother. Uncontrolled gestational diabetes may also cause the newborn baby to have breathing problems and low blood glucose. Women who get gestational diabetes are more likely to develop it if they get pregnant again, and they are more likely to develop type 2 diabetes in the future. How to monitor blood glucose   Check your blood glucose every day during your pregnancy. Do this as often as told by your health care provider.  Contact your health care provider if your blood glucose is above your target for two tests in a row. Your health care provider will set individualized treatment goals for you. Generally, the goal of treatment is to maintain the following blood glucose levels during pregnancy:  Before meals (preprandial): at or below 95 mg/dL (5.3 mmol/L).  After meals (postprandial): ? One hour after a meal: at or below 140 mg/dL (7.8 mmol/L). ? Two hours after a meal: at or below 120 mg/dL (6.7 mmol/L).  A1c (hemoglobin A1c) level: 6-6.5%. How to manage hyperglycemia and hypoglycemia Hyperglycemia symptoms Hyperglycemia, also called high blood glucose, occurs when blood glucose is too high. Make sure you know the early signs of hyperglycemia, such as:   Increased thirst.  Hunger.  Feeling very tired.  Needing to urinate more often than usual.  Blurry vision. Hypoglycemia symptoms Hypoglycemia, also called low blood glucose, occurs with a blood glucose level at or below 70 mg/dL (3.9 mmol/L). The risk for hypoglycemia increases during or after exercise, during sleep, during illness, and when skipping meals or not eating for a long time (fasting). Symptoms may include:  Hunger.  Anxiety.  Sweating and feeling clammy.  Confusion.  Dizziness or feeling light-headed.  Sleepiness.  Nausea.  Increased heart rate.  Headache.  Blurry vision.  Irritability.  Tingling or numbness around the mouth, lips, or tongue.  A change in coordination.  Restless sleep.  Fainting.  Seizure. It is important to know the symptoms of hypoglycemia and treat it right away. Always have a 15-gram rapid-acting carbohydrate snack with you to treat low blood glucose. Family members and close friends should also know the symptoms and should understand how to treat hypoglycemia, in case you are not able to treat yourself. Treating hypoglycemia If you are alert and able to swallow safely, follow the 15:15 rule:  Take 15 grams of a rapid-acting carbohydrate. Talk with your health care provider about how much you should take.  Rapid-acting options include: ? Glucose pills (take 15 grams). ? 6-8 pieces of hard candy. ? 4-6 oz (120-150 mL) of fruit juice. ? 4-6 oz (120-150 mL) of regular (not diet) soda. ? 1 Tbsp (15 mL) honey or sugar.  Check your blood glucose 15 minutes after you take the carbohydrate.  If the repeat blood glucose level is still at or below 70 mg/dL (3.9  4-6 oz (120-150 mL) of fruit juice.  ? 4-6 oz (120-150 mL) of regular (not diet) soda.  ? 1 Tbsp (15 mL) honey or sugar.   Check your blood glucose 15 minutes after you take the carbohydrate.   If the repeat blood glucose level is still at or below 70 mg/dL (3.9 mmol/L), take 15 grams of a carbohydrate again.   If your blood glucose level does not increase above 70 mg/dL (3.9 mmol/L) after 3 tries, seek emergency medical care.   After your blood glucose level returns to normal, eat a meal or a snack within 1 hour.  Treating  severe hypoglycemia  Severe hypoglycemia is when your blood glucose level is at or below 54 mg/dL (3 mmol/L). Severe hypoglycemia is an emergency. Do not wait to see if the symptoms will go away. Get medical help right away. Call your local emergency services (911 in the [place]  If you have severe hypoglycemia and you cannot eat or drink, you may need an injection of glucagon. A family member or close friend should learn how to check your blood glucose and how to give you a glucagon injection. Ask your health care provider if you need to have an emergency glucagon injection kit available.  Severe hypoglycemia may need to be treated in a hospital. The treatment may include getting glucose through an IV. You may also need treatment for the cause of your hypoglycemia.  Follow these instructions at home:  Take diabetes medicines as told   If your health care provider prescribed insulin or diabetes medicines, take them every day.   Do not run out of insulin or other diabetes medicines that you take. Plan ahead so you always have these available.   If you use insulin, adjust your dosage based on how physically active you are and what foods you eat. Your health care provider will tell you how to adjust your dosage.  Make healthy food choices    The things that you eat and drink affect your blood glucose (and your insulin dose, if this applies). Making good choices helps to control your diabetes and prevent other health problems. A healthy meal plan includes eating lean proteins, complex carbohydrates, fresh fruits and vegetables, low-fat dairy products, and healthy fats.  Make an appointment to see a diet and nutrition specialist (registered dietitian) to help you create an eating plan that is right for you. Make sure that you:   Follow instructions from your health care provider about eating or drinking restrictions.   Drink enough fluid to keep your urine pale yellow.   Eat healthy snacks between nutritious  meals.   Keep a record of the carbohydrates that you eat. Do this by reading food labels and learning the standard serving sizes of foods.   Follow your sick day plan whenever you cannot eat or drink as usual. Make this plan in advance with your health care provider.    Stay active   Do 30 or more minutes of physical activity a day, or as much physical activity as your health care provider recommends during your pregnancy.  ? Doing 10 minutes of exercise starting 30 minutes after each meal may help to control postprandial blood glucose levels.   If you start a new exercise or activity, work with your health care provider to adjust your insulin, medicines, or food intake as needed.  Make healthy lifestyle choices   Do not drink alcohol.   Do not use any tobacco products, such as   prescription medicines only as told by your health care provider.  Talk with your health care provider about your risk for high blood pressure during pregnancy (preeclampsia or eclampsia).  Share your diabetes management plan with people in your workplace, school, and household.  Check your urine for ketones during your pregnancy when you are ill and as told by your health care provider.  Carry a medical alert card or wear medical alert jewelry that says you have gestational diabetes.  Keep all follow-up visits during your pregnancy (prenatal) and after delivery (postnatal) as told by your health care provider. This is important. Get the care that you need after  delivery  Have your blood glucose level checked 4-12 weeks after delivery. This is done with an oral glucose tolerance test (OGTT).  Get screened for diabetes at least every 3 years, or as often as told by your health care provider. Questions to ask your health care provider  Do I need to meet with a diabetes educator?  Where can I find a support group for people with gestational diabetes? Where to find more information For more information about gestational diabetes, visit:  American Diabetes Association (ADA): www.diabetes.org  Centers for Disease Control and Prevention (CDC): http://www.wolf.info/ Summary  Check your blood glucose every day during your pregnancy. Do this as often as told by your health care provider.  If your health care provider prescribed insulin or diabetes medicines, take them every day as told.  Keep all follow-up visits during your pregnancy (prenatal) and after delivery (postnatal) as told by your health care provider. This is important.  Have your blood glucose level checked 4-12 weeks after delivery. This information is not intended to replace advice given to you by your health care provider. Make sure you discuss any questions you have with your health care provider. Document Released: 03/10/2016 Document Revised: 05/10/2018 Document Reviewed: 12/21/2015 Elsevier Patient Education  2020 [employer].

## 2019-08-29 ENCOUNTER — Encounter: Payer: Self-pay | Admitting: Obstetrics and Gynecology

## 2019-08-29 ENCOUNTER — Ambulatory Visit (INDEPENDENT_AMBULATORY_CARE_PROVIDER_SITE_OTHER): Payer: 59 | Admitting: Obstetrics and Gynecology

## 2019-08-29 ENCOUNTER — Other Ambulatory Visit: Payer: Self-pay

## 2019-08-29 VITALS — BP 134/84 | Wt 249.0 lb

## 2019-08-29 DIAGNOSIS — O24414 Gestational diabetes mellitus in pregnancy, insulin controlled: Secondary | ICD-10-CM | POA: Diagnosis not present

## 2019-08-29 DIAGNOSIS — Z6835 Body mass index (BMI) 35.0-35.9, adult: Secondary | ICD-10-CM | POA: Insufficient documentation

## 2019-08-29 DIAGNOSIS — O0993 Supervision of high risk pregnancy, unspecified, third trimester: Secondary | ICD-10-CM

## 2019-08-29 DIAGNOSIS — O0990 Supervision of high risk pregnancy, unspecified, unspecified trimester: Secondary | ICD-10-CM

## 2019-08-29 DIAGNOSIS — O99210 Obesity complicating pregnancy, unspecified trimester: Secondary | ICD-10-CM

## 2019-08-29 DIAGNOSIS — Z3A34 34 weeks gestation of pregnancy: Secondary | ICD-10-CM | POA: Diagnosis not present

## 2019-08-29 DIAGNOSIS — O99213 Obesity complicating pregnancy, third trimester: Secondary | ICD-10-CM

## 2019-08-29 NOTE — Progress Notes (Signed)
Routine Prenatal Care Visit  Subjective  Sarah Holland is a 28 y.o. [id number] at [redacted]w[redacted]d being seen today for ongoing prenatal care.  She is currently monitored for the following issues for this high-risk pregnancy and has SIRS (systemic inflammatory response syndrome) (HCC); Supervision of high risk pregnancy, antepartum; Obesity (BMI 30-39.9); Insulin controlled gestational diabetes mellitus (GDM) in third trimester; Obesity complicating peripregnancy, antepartum; and BMI 35.0-35.9,adult on their problem list.  ----------------------------------------------------------------------------------- Patient reports no complaints.   Contractions: Not present. Vag. Bleeding: None.  Movement: Present. Leaking Fluid denies.  GDM: BG mostly not controlled fasting and 2h pp lunch.  Most values normal 2h pp breakfast/dinner  Chart:    ----------------------------------------------------------------------------------- The following portions of the patient's history were reviewed and updated as appropriate: allergies, current medications, past family history, past medical history, past social history, past surgical history and problem list. Problem list updated.  Objective  Blood pressure 134/84, weight 249 lb (112.9 kg), last menstrual period 12/30/2018. Pregravid weight 213 lb (96.6 kg) Total Weight Gain 36 lb (16.3 kg) Urinalysis: Urine Protein    Urine Glucose    Fetal Status: Fetal Heart Rate (bpm): 135   Movement: Present     General:  Alert, oriented and cooperative. Patient is in no acute distress.  Skin: Skin is warm and dry. No rash noted.   Cardiovascular: Normal heart rate noted  Respiratory: Normal respiratory effort, no problems with respiration noted  Abdomen: Soft, gravid, appropriate for gestational age. Pain/Pressure: Absent     Pelvic:  Cervical exam deferred        Extremities: Normal range of motion.  Edema: Trace  Mental Status: Normal mood and affect. Normal behavior. Normal  judgment and thought content.   NST: Baseline FHR: 135 beats/min Variability: moderate Accelerations: present Decelerations: absent Tocometry: not done  Interpretation:  INDICATIONS: gestational diabetes mellitus RESULTS:  A NST procedure was performed with FHR monitoring and a normal baseline established, appropriate time of 20-40 minutes of evaluation, and accels >2 seen w 15x15 characteristics.  Results show a REACTIVE NST.    Assessment   28 y.o. [id number] at [redacted]w[redacted]d by  10/06/2019, by Last Menstrual Period presenting for routine prenatal visit  Plan   pregnancy6 Problems (from 12/30/18 to present)    Problem Noted Resolved   BMI 35.0-35.9,adult 08/29/2019 by Conard Novak, MD No   Obesity complicating peripregnancy, antepartum 08/09/2019 by Vena Austria, MD No   Insulin controlled gestational diabetes mellitus (GDM) in third trimester 08/02/2019 by Farrel Conners, CNM No   Overview Addendum 08/29/2019  4:14 PM by Conard Novak, MD    Lantus 20 units bedtime start 9/8  * increased to 25 units 9/15  * increased to 30 units 9/21  * increased to 35 units 9/28 Lispro 5 units AC tid start 9/21  * increased to 5 units AC bfst/dinner, 8 units AC lunch 9/28      Supervision of high risk pregnancy, antepartum 04/05/2019 by Tresea Mall, CNM No   Overview Addendum 08/09/2019 12:30 AM by Farrel Conners, CNM    [facility]inic [facility] Prenatal Labs  Dating LMP Blood type: B/Positive/-- (04/22 1503)   Genetic Screen NIPS: Negative XY Antibody:Negative (04/22 1503)  Anatomic [place] Complete 05/16/2019 Rubella: 3.96 (04/22 1503) Varicella: Immune  GTT Early:               Third trimester: 204; 3 hr 98/219/204/140- RPR: Non Reactive (04/22 1503)   Rhogam N/A HBsAg: Negative (04/22 1503)   TDaP vaccine  08/02/19               Flu Shot: HIV: Non Reactive (04/22 1503)   Baby Food                                GBS:   Contraception  Pap: 03/23/2019, NILM  CBB     CS/VBAC    Support  Person                  Preterm labor symptoms and general obstetric precautions including but not limited to vaginal bleeding, contractions, leaking of fluid and fetal movement were reviewed in detail with the patient. Please refer to After Visit Summary for other counseling recommendations.   - increased lantus to 35 units daily - increased lispro to 8 units before 2nd meal, continue 5 units AC 1st and 3rd meals  - u/s next week for AFI - growth u/s in 2 weeks with twice weekly AP testing with AFI/NST - consider earlier delivery, if BG remains uncontrolled.   Return in about 1 week (around 09/05/2019) for U/S for afi with Routine Prenatal Appointment/NST.  Prentice Docker, MD, [facility], [facility] 08/29/2019 4:14 PM

## 2019-09-05 ENCOUNTER — Ambulatory Visit (INDEPENDENT_AMBULATORY_CARE_PROVIDER_SITE_OTHER): Payer: 59

## 2019-09-05 ENCOUNTER — Other Ambulatory Visit: Payer: Self-pay

## 2019-09-05 ENCOUNTER — Ambulatory Visit (INDEPENDENT_AMBULATORY_CARE_PROVIDER_SITE_OTHER): Payer: 59 | Admitting: Obstetrics & Gynecology

## 2019-09-05 ENCOUNTER — Encounter: Payer: Self-pay | Admitting: Obstetrics & Gynecology

## 2019-09-05 VITALS — BP 130/80 | Wt 255.0 lb

## 2019-09-05 DIAGNOSIS — O24414 Gestational diabetes mellitus in pregnancy, insulin controlled: Secondary | ICD-10-CM

## 2019-09-05 DIAGNOSIS — O99213 Obesity complicating pregnancy, third trimester: Secondary | ICD-10-CM | POA: Diagnosis not present

## 2019-09-05 DIAGNOSIS — Z6835 Body mass index (BMI) 35.0-35.9, adult: Secondary | ICD-10-CM

## 2019-09-05 DIAGNOSIS — Z3A35 35 weeks gestation of pregnancy: Secondary | ICD-10-CM | POA: Diagnosis not present

## 2019-09-05 DIAGNOSIS — O99210 Obesity complicating pregnancy, unspecified trimester: Secondary | ICD-10-CM

## 2019-09-05 DIAGNOSIS — O0993 Supervision of high risk pregnancy, unspecified, third trimester: Secondary | ICD-10-CM

## 2019-09-05 DIAGNOSIS — O0990 Supervision of high risk pregnancy, unspecified, unspecified trimester: Secondary | ICD-10-CM

## 2019-09-05 NOTE — Progress Notes (Signed)
  Subjective  Fetal Movement? yes Contractions? no Leaking Fluid? no Vaginal Bleeding? no BS improving, still several high (<50%) Objective  BP 130/80   Wt 255 lb (115.7 kg)   LMP 12/30/2018 Comment: neg preg test  BMI 42.43 kg/m  General: NAD Pumonary: no increased work of breathing Abdomen: gravid, non-tender Extremities: no edema Psychiatric: mood appropriate, affect full  A NST procedure was performed with FHR monitoring and a normal baseline established, appropriate time of 20-40 minutes of evaluation, and accels >2 seen w 15x15 characteristics.  Results show a REACTIVE NST.   Assessment  28 y.o. [id number] at [redacted]w[redacted]d by  10/06/2019, by Last Menstrual Period presenting for routine prenatal visit  Plan   Problem List Items Addressed This Visit      Endocrine   Insulin controlled gestational diabetes mellitus (GDM) in third trimester   Relevant Orders   US OB Follow Up     Other   Supervision of high risk pregnancy, antepartum   Obesity complicating peripregnancy, antepartum   BMI 35.0-35.9,adult    Other Visit Diagnoses    [redacted] weeks gestation of pregnancy    -  Primary      pregnancy6 Problems (from 12/30/18 to present)    Problem Noted Resolved   BMI 35.0-35.9,adult 08/29/2019 by Will Bonnet, MD No   Obesity complicating peripregnancy, antepartum 08/09/2019 by Malachy Mood, MD No   Insulin controlled gestational diabetes mellitus (GDM) in third trimester 08/02/2019 by Dalia Heading, CNM No   Overview Addendum 08/29/2019  4:14 PM by Will Bonnet, MD    Lantus 20 units bedtime start 9/8  * increased to 25 units 9/15  * increased to 30 units 9/21  * increased to 35 units 9/28             * maintained at 10/5 visit Lispro 5 units AC tid start 9/21  * increased to 5 units AC bfst/dinner, 8 units AC lunch 9/28      Supervision of high risk pregnancy, antepartum 04/05/2019 by Rod Can, CNM No   Overview Addendum 08/09/2019 12:30 AM by Dalia Heading, [facility] Prenatal Labs  Dating LMP Blood type: B/Positive/-- (04/22 1503)   Genetic Screen NIPS: Negative XY Antibody:Negative (04/22 1503)  Anatomic [place] Complete 05/16/2019 Rubella: 3.96 (04/22 1503) Varicella: Immune  GTT Early:               Third trimester: 204; 3 hr 98/219/204/140- RPR: Non Reactive (04/22 1503)   Rhogam N/A HBsAg: Negative (04/22 1503)   TDaP vaccine        08/02/19               Flu Shot: HIV: Non Reactive (04/22 1503)   Baby Food                                GBS:   Contraception  Pap: 03/23/2019, NILM  CBB     CS/VBAC    Support Person                Cont Insulin, and dietary control of BS and DM  [place] growth one week  GBS one week  NST weekly, start biweekly next week  IOL based on DM control, or 39 weeks   Barnett Applebaum, MD, [place], [facility] 09/05/2019  11:12 AM

## 2019-09-13 ENCOUNTER — Ambulatory Visit (INDEPENDENT_AMBULATORY_CARE_PROVIDER_SITE_OTHER): Payer: 59

## 2019-09-13 ENCOUNTER — Ambulatory Visit (INDEPENDENT_AMBULATORY_CARE_PROVIDER_SITE_OTHER): Payer: 59 | Admitting: Obstetrics & Gynecology

## 2019-09-13 ENCOUNTER — Encounter: Payer: Self-pay | Admitting: Obstetrics & Gynecology

## 2019-09-13 ENCOUNTER — Other Ambulatory Visit: Payer: Self-pay

## 2019-09-13 VITALS — BP 122/80 | Wt 254.0 lb

## 2019-09-13 DIAGNOSIS — Z3A36 36 weeks gestation of pregnancy: Secondary | ICD-10-CM

## 2019-09-13 DIAGNOSIS — O24414 Gestational diabetes mellitus in pregnancy, insulin controlled: Secondary | ICD-10-CM | POA: Diagnosis not present

## 2019-09-13 DIAGNOSIS — O99213 Obesity complicating pregnancy, third trimester: Secondary | ICD-10-CM

## 2019-09-13 DIAGNOSIS — O0993 Supervision of high risk pregnancy, unspecified, third trimester: Secondary | ICD-10-CM

## 2019-09-13 DIAGNOSIS — Z3685 Encounter for antenatal screening for Streptococcus B: Secondary | ICD-10-CM

## 2019-09-13 DIAGNOSIS — O0990 Supervision of high risk pregnancy, unspecified, unspecified trimester: Secondary | ICD-10-CM

## 2019-09-13 DIAGNOSIS — E669 Obesity, unspecified: Secondary | ICD-10-CM

## 2019-09-13 DIAGNOSIS — O99210 Obesity complicating pregnancy, unspecified trimester: Secondary | ICD-10-CM

## 2019-09-13 NOTE — Progress Notes (Signed)
  Subjective  Fetal Movement? yes Contractions? no Leaking Fluid? no Vaginal Bleeding? no BS elevated at 50% of values Objective  BP 122/80   Wt 254 lb (115.2 kg)   LMP 12/30/2018 Comment: neg preg test  BMI 42.27 kg/m  General: NAD Pumonary: no increased work of breathing Abdomen: gravid, non-tender Extremities: no edema Psychiatric: mood appropriate, affect full  A NST procedure was performed with FHR monitoring and a normal baseline established, appropriate time of 20-40 minutes of evaluation, and accels >2 seen w 15x15 characteristics.  Results show a REACTIVE NST.   Assessment  28 y.o. [id number] at [redacted]w[redacted]d by  10/06/2019, by Last Menstrual Period presenting for routine prenatal visit  Plan   Problem List Items Addressed This Visit      Endocrine   Insulin controlled gestational diabetes mellitus (GDM) in third trimester     Other   Supervision of high risk pregnancy, antepartum   Obesity (BMI 30-39.9)   Obesity complicating peripregnancy, antepartum    Other Visit Diagnoses    [redacted] weeks gestation of pregnancy    -  Primary   Encounter for antenatal screening for Streptococcus B       Relevant Orders   Culture, beta strep (group b only)      pregnancy6 Problems (from 12/30/18 to present)    Problem Noted Resolved   BMI 35.0-35.9,adult 08/29/2019 by Will Bonnet, MD No   Obesity complicating peripregnancy, antepartum 08/09/2019 by Malachy Mood, MD No   Insulin controlled gestational diabetes mellitus (GDM) in third trimester 08/02/2019 by Dalia Heading, CNM No   Overview Addendum 08/29/2019  4:14 PM by Will Bonnet, MD    Lantus 20 units bedtime start 9/8  * increased to 25 units 9/15  * increased to 30 units 9/21  * increased to 35 units 9/28 Lispro 5 units AC tid start 9/21  * increased to 5 units AC bfst/dinner, 8 units AC lunch 9/28             * increased to 8 units [facility] 10/13      Supervision of high risk pregnancy, antepartum 04/05/2019 by  Rod Can, CNM No   Overview Addendum 08/09/2019 12:30 AM by Dalia Heading, [facility] Prenatal Labs  Dating LMP Blood type: B/Positive/-- (04/22 1503)   Genetic Screen NIPS: Negative XY Antibody:Negative (04/22 1503)  Anatomic [place] Complete 05/16/2019 Rubella: 3.96 (04/22 1503) Varicella: Immune  GTT Third trimester: 204; 3 hr 98/219/204/140- RPR: Non Reactive (04/22 1503)   Rhogam N/A HBsAg: Negative (04/22 1503)   TDaP vaccine   08/02/19 Flu Shot:declines HIV: Non Reactive (04/22 1503)   Baby Food  breast                              GBS: today  Contraception unsure Pap: 03/23/2019, NILM  CBB  no   CS/VBAC n/a   Support Person                change in Insulin discussed; importance of better control emphasized  NST twice weekly  AFI weekly  IOL 39 weeks  Barnett Applebaum, MD, [place], [facility] 09/13/2019  2:33 PM

## 2019-09-13 NOTE — Patient Instructions (Signed)
Group B Streptococcus Infection During Pregnancy  Group B Streptococcus (GBS) is a type of bacteria (Streptococcus agalactiae) that is often found in healthy people, commonly in the rectum, vagina, and intestines. In people who are healthy and not pregnant, the bacteria rarely cause serious illness or complications. However, women who test positive for GBS during pregnancy can pass the bacteria to their baby during childbirth, which can cause serious infection in the baby after birth. Women with GBS may also have infections during their pregnancy or immediately after childbirth, such as urinary tract infections (UTIs) or infections of the uterus (uterine infections). Having GBS also increases a woman's risk of complications during pregnancy, such as early (preterm) labor or delivery, miscarriage, or stillbirth. Routine testing (screening) for GBS is recommended for all pregnant women. What increases the risk? You may have a higher risk for GBS infection during pregnancy if you had one during a past pregnancy. What are the signs or symptoms? In most cases, GBS infection does not cause symptoms in pregnant women. Signs and symptoms of a possible GBS-related infection may include:  Labor starting before the 37th week of pregnancy.  A UTI or bladder infection, which may cause: ? Fever. ? Pain or burning during urination. ? Frequent urination.  Fever during labor, along with: ? Bad-smelling discharge. ? Uterine tenderness. ? Rapid heartbeat in the mother, baby, or both. Rare but serious symptoms of a possible GBS-related infection in women include:  Blood infection (septicemia). This may cause fever, chills, or confusion.  Lung infection (pneumonia). This may cause fever, chills, cough, rapid breathing, difficulty breathing, or chest pain.  Bone, joint, skin, or soft tissue infection. How is this diagnosed? You may be screened for GBS between week 35 and week 37 of your pregnancy. If you have  symptoms of preterm labor, you may be screened earlier. This condition is diagnosed based on lab test results from:  A swab of fluid from the vagina and rectum.  A urine sample. How is this treated? This condition is treated with antibiotic medicine. When you go into labor, or as soon as your water breaks (your membranes rupture), you will be given antibiotics through an IV tube. Antibiotics will continue until after you give birth. If you are having a cesarean delivery, you do not need antibiotics unless your membranes have already ruptured. Follow these instructions at home:  Take over-the-counter and prescription medicines only as told by your health care provider.  Take your antibiotic medicine as told by your health care provider. Do not stop taking the antibiotic even if you start to feel better.  Keep all pre-birth (prenatal) visits and follow-up visits as told by your health care provider. This is important. Contact a health care provider if:  You have pain or burning when you urinate.  You have to urinate frequently.  You have a fever or chills.  You develop a bad-smelling vaginal discharge. Get help right away if:  Your membranes rupture.  You go into labor.  You have severe pain in your abdomen.  You have difficulty breathing.  You have chest pain. This information is not intended to replace advice given to you by your health care provider. Make sure you discuss any questions you have with your health care provider. Document Released: 02/24/2008 Document Revised: 03/10/2019 Document Reviewed: 06/12/2016 Elsevier Patient Education  2020 Elsevier Inc.  

## 2019-09-16 ENCOUNTER — Other Ambulatory Visit: Payer: Self-pay | Admitting: Obstetrics & Gynecology

## 2019-09-16 ENCOUNTER — Ambulatory Visit (INDEPENDENT_AMBULATORY_CARE_PROVIDER_SITE_OTHER): Payer: 59 | Admitting: Obstetrics and Gynecology

## 2019-09-16 ENCOUNTER — Encounter: Payer: Self-pay | Admitting: Obstetrics and Gynecology

## 2019-09-16 ENCOUNTER — Other Ambulatory Visit: Payer: Self-pay

## 2019-09-16 VITALS — BP 128/84 | Wt 255.0 lb

## 2019-09-16 DIAGNOSIS — O24414 Gestational diabetes mellitus in pregnancy, insulin controlled: Secondary | ICD-10-CM

## 2019-09-16 DIAGNOSIS — Z3A37 37 weeks gestation of pregnancy: Secondary | ICD-10-CM | POA: Diagnosis not present

## 2019-09-16 DIAGNOSIS — O99210 Obesity complicating pregnancy, unspecified trimester: Secondary | ICD-10-CM

## 2019-09-16 DIAGNOSIS — O0990 Supervision of high risk pregnancy, unspecified, unspecified trimester: Secondary | ICD-10-CM

## 2019-09-16 DIAGNOSIS — Z6835 Body mass index (BMI) 35.0-35.9, adult: Secondary | ICD-10-CM

## 2019-09-16 DIAGNOSIS — O0993 Supervision of high risk pregnancy, unspecified, third trimester: Secondary | ICD-10-CM

## 2019-09-16 DIAGNOSIS — O99213 Obesity complicating pregnancy, third trimester: Secondary | ICD-10-CM

## 2019-09-16 NOTE — Progress Notes (Signed)
Routine Prenatal Care Visit  Subjective  Sarah Holland is a 28 y.o. [phone] at [redacted]w[redacted]d being seen today for ongoing prenatal care.  She is currently monitored for the following issues for this high-risk pregnancy and has SIRS (systemic inflammatory response syndrome) (HCC); Supervision of high risk pregnancy, antepartum; Obesity (BMI 30-39.9); Insulin controlled gestational diabetes mellitus (GDM) in third trimester; Obesity complicating peripregnancy, antepartum; and BMI 35.0-35.9,adult on their problem list.  ----------------------------------------------------------------------------------- Patient reports no complaints.   Contractions: Not present. Vag. Bleeding: None.  Movement: Present. Leaking Fluid denies.  GDM: most fasting values elevated. Most 2 hour pp values barely elevated. ----------------------------------------------------------------------------------- The following portions of the patient's history were reviewed and updated as appropriate: allergies, current medications, past family history, past medical history, past social history, past surgical history and problem list. Problem list updated.  Objective  Blood pressure 128/84, weight 255 lb (115.7 kg), last menstrual period 12/30/2018. Pregravid weight 213 lb (96.6 kg) Total Weight Gain 42 lb (19.1 kg) Urinalysis: Urine Protein    Urine Glucose    Fetal Status: Fetal Heart Rate (bpm): 140   Movement: Present     General:  Alert, oriented and cooperative. Patient is in no acute distress.  Skin: Skin is warm and dry. No rash noted.   Cardiovascular: Normal heart rate noted  Respiratory: Normal respiratory effort, no problems with respiration noted  Abdomen: Soft, gravid, appropriate for gestational age. Pain/Pressure: Present     Pelvic:  Cervical exam deferred        Extremities: Normal range of motion.  Edema: None  Mental Status: Normal mood and affect. Normal behavior. Normal judgment and thought content.    NST: Baseline FHR: 145 beats/min Variability: moderate Accelerations: present Decelerations: absent Tocometry: not done  Interpretation:  INDICATIONS: gestational diabetes mellitus RESULTS:  A NST procedure was performed with FHR monitoring and a normal baseline established, appropriate time of 20-40 minutes of evaluation, and accels >2 seen w 15x15 characteristics.  Results show a REACTIVE NST.    Assessment   28 y.o. [id number] at [redacted]w[redacted]d by  10/06/2019, by Last Menstrual Period presenting for routine prenatal visit  Plan   pregnancy6 Problems (from 12/30/18 to present)    Problem Noted Resolved   BMI 35.0-35.9,adult 08/29/2019 by Conard Novak, MD No   Obesity complicating peripregnancy, antepartum 08/09/2019 by Vena Austria, MD No   Insulin controlled gestational diabetes mellitus (GDM) in third trimester 08/02/2019 by Farrel Conners, CNM No   Overview Addendum 09/16/2019 11:47 AM by Conard Novak, MD    Lantus 20 units bedtime start 9/8  * increased to 25 units 9/15  * increased to 30 units 9/21  * increased to 35 units 9/28  * increased to 40 units 10/16 Lispro 5 units AC tid start 9/21  * increased to 5 units AC bfst/dinner, 8 units AC lunch 9/28  * increase to 8 units AC all meals 10/14      Supervision of high risk pregnancy, antepartum 04/05/2019 by Tresea Mall, CNM No   Overview Addendum 08/09/2019 12:30 AM by Farrel Conners, CNM    Clinic [facility] Prenatal Labs  Dating LMP Blood type: B/Positive/-- (04/22 1503)   Genetic Screen NIPS: Negative XY Antibody:Negative (04/22 1503)  Anatomic [place] Complete 05/16/2019 Rubella: 3.96 (04/22 1503) Varicella: Immune  GTT Early:               Third trimester: 204; 3 hr 98/219/204/140- RPR: Non Reactive (04/22 1503)   Rhogam N/A HBsAg: Negative (04/22 1503)  TDaP vaccine        08/02/19               Flu Shot: HIV: Non Reactive (04/22 1503)   Baby Food                                GBS: pending  Contraception   Pap: 03/23/2019, NILM  CBB     CS/VBAC    Support Person                  Term labor symptoms and general obstetric precautions including but not limited to vaginal bleeding, contractions, leaking of fluid and fetal movement were reviewed in detail with the patient. Please refer to After Visit Summary for other counseling recommendations.   Return in about 5 days (around 09/21/2019) for Keep previously schedule appts.  Prentice Docker, MD, [facility], [facility] 09/16/2019 11:47 AM

## 2019-09-17 LAB — CULTURE, BETA STREP (GROUP B ONLY): Strep Gp B Culture: NEGATIVE

## 2019-09-21 ENCOUNTER — Ambulatory Visit (INDEPENDENT_AMBULATORY_CARE_PROVIDER_SITE_OTHER): Payer: 59 | Admitting: Obstetrics and Gynecology

## 2019-09-21 ENCOUNTER — Ambulatory Visit (INDEPENDENT_AMBULATORY_CARE_PROVIDER_SITE_OTHER): Payer: 59

## 2019-09-21 ENCOUNTER — Encounter: Payer: Self-pay | Admitting: Obstetrics and Gynecology

## 2019-09-21 ENCOUNTER — Other Ambulatory Visit: Payer: Self-pay

## 2019-09-21 VITALS — BP 140/90 | Wt 257.0 lb

## 2019-09-21 DIAGNOSIS — O99213 Obesity complicating pregnancy, third trimester: Secondary | ICD-10-CM

## 2019-09-21 DIAGNOSIS — Z3A37 37 weeks gestation of pregnancy: Secondary | ICD-10-CM

## 2019-09-21 DIAGNOSIS — O24414 Gestational diabetes mellitus in pregnancy, insulin controlled: Secondary | ICD-10-CM

## 2019-09-21 DIAGNOSIS — O99210 Obesity complicating pregnancy, unspecified trimester: Secondary | ICD-10-CM

## 2019-09-21 DIAGNOSIS — O0990 Supervision of high risk pregnancy, unspecified, unspecified trimester: Secondary | ICD-10-CM

## 2019-09-21 LAB — POCT URINALYSIS DIPSTICK OB
Glucose, UA: NEGATIVE
POC,PROTEIN,UA: NEGATIVE

## 2019-09-21 NOTE — Progress Notes (Signed)
ROB/NST/AFI- no concerns

## 2019-09-21 NOTE — Progress Notes (Signed)
Routine Prenatal Care Visit  Subjective  Sarah Holland is a 28 y.o. [phone] at [redacted]w[redacted]d being seen today for ongoing prenatal care.  She is currently monitored for the following issues for this high-risk pregnancy and has SIRS (systemic inflammatory response syndrome) (HCC); Supervision of high risk pregnancy, antepartum; Obesity (BMI 30-39.9); Insulin controlled gestational diabetes mellitus (GDM) in third trimester; Obesity complicating peripregnancy, antepartum; and BMI 35.0-35.9,adult on their problem list.  ----------------------------------------------------------------------------------- Patient reports no complaints.   Contractions: Not present. Vag. Bleeding: None.  Movement: Present. Denies leaking of fluid.  ----------------------------------------------------------------------------------- The following portions of the patient's history were reviewed and updated as appropriate: allergies, current medications, past family history, past medical history, past social history, past surgical history and problem list. Problem list updated.   Objective  Blood pressure 140/90, weight 257 lb (116.6 kg), last menstrual period 12/30/2018. Pregravid weight 213 lb (96.6 kg) Total Weight Gain 44 lb (20 kg) Urinalysis:      Fetal Status: Fetal Heart Rate (bpm): 140   Movement: Present     General:  Alert, oriented and cooperative. Patient is in no acute distress.  Skin: Skin is warm and dry. No rash noted.   Cardiovascular: Normal heart rate noted  Respiratory: Normal respiratory effort, no problems with respiration noted  Abdomen: Soft, gravid, appropriate for gestational age. Pain/Pressure: Absent     Pelvic:  Cervical exam deferred        Extremities: Normal range of motion.     Mental Status: Normal mood and affect. Normal behavior. Normal judgment and thought content.     Assessment   28 y.o. [id number] at [redacted]w[redacted]d by  10/06/2019, by Last Menstrual Period presenting for routine  prenatal visit  Plan   pregnancy6 Problems (from 12/30/18 to present)    Problem Noted Resolved   BMI 35.0-35.9,adult 08/29/2019 by Conard Novak, MD No   Obesity complicating peripregnancy, antepartum 08/09/2019 by Vena Austria, MD No   Insulin controlled gestational diabetes mellitus (GDM) in third trimester 08/02/2019 by Farrel Conners, CNM No   Overview Addendum 09/16/2019 11:47 AM by Conard Novak, MD    Lantus 20 units bedtime start 9/8  * increased to 25 units 9/15  * increased to 30 units 9/21  * increased to 35 units 9/28  * increased to 40 units 10/16 Lispro 5 units AC tid start 9/21  * increased to 5 units AC bfst/dinner, 8 units AC lunch 9/28  * increase to 8 units AC all meals 10/14      Supervision of high risk pregnancy, antepartum 04/05/2019 by Tresea Mall, CNM No   Overview Addendum 08/09/2019 12:30 AM by Farrel Conners, CNM    Clinic [facility] Prenatal Labs  Dating LMP Blood type: B/Positive/-- (04/22 1503)   Genetic Screen NIPS: Negative XY Antibody:Negative (04/22 1503)  Anatomic [place] Complete 05/16/2019 Rubella: 3.96 (04/22 1503) Varicella: Immune  GTT Early:               Third trimester: 204; 3 hr 98/219/204/140- RPR: Non Reactive (04/22 1503)   Rhogam N/A HBsAg: Negative (04/22 1503)   TDaP vaccine        08/02/19               Flu Shot: HIV: Non Reactive (04/22 1503)   Baby Food                                GBS:  Contraception  Pap: 03/23/2019, NILM  CBB     CS/VBAC    Support Person                  Gestational age appropriate obstetric precautions including but not limited to vaginal bleeding, contractions, leaking of fluid and fetal movement were reviewed in detail with the patient.    Patient declines IOL scheduling today.  Reviewed glucose log- no insulin regimen changes.  [place] reviewed with patient.  Return in about 3 days (around 09/24/2019) for ROB and NST with MD in persont.  Homero Fellers MD [facility], [facility] 09/21/2019, 4:36 PM

## 2019-09-23 ENCOUNTER — Other Ambulatory Visit: Payer: Self-pay

## 2019-09-23 ENCOUNTER — Encounter: Payer: Self-pay | Admitting: Obstetrics and Gynecology

## 2019-09-23 ENCOUNTER — Ambulatory Visit (INDEPENDENT_AMBULATORY_CARE_PROVIDER_SITE_OTHER): Payer: 59 | Admitting: Obstetrics and Gynecology

## 2019-09-23 VITALS — BP 138/88 | Wt 257.0 lb

## 2019-09-23 DIAGNOSIS — Z6835 Body mass index (BMI) 35.0-35.9, adult: Secondary | ICD-10-CM

## 2019-09-23 DIAGNOSIS — Z3A38 38 weeks gestation of pregnancy: Secondary | ICD-10-CM | POA: Diagnosis not present

## 2019-09-23 DIAGNOSIS — O24414 Gestational diabetes mellitus in pregnancy, insulin controlled: Secondary | ICD-10-CM | POA: Diagnosis not present

## 2019-09-23 DIAGNOSIS — O0990 Supervision of high risk pregnancy, unspecified, unspecified trimester: Secondary | ICD-10-CM

## 2019-09-23 DIAGNOSIS — O99210 Obesity complicating pregnancy, unspecified trimester: Secondary | ICD-10-CM

## 2019-09-23 DIAGNOSIS — O99213 Obesity complicating pregnancy, third trimester: Secondary | ICD-10-CM

## 2019-09-23 DIAGNOSIS — O0993 Supervision of high risk pregnancy, unspecified, third trimester: Secondary | ICD-10-CM

## 2019-09-23 NOTE — Progress Notes (Signed)
Routine Prenatal Care Visit  Subjective  Sarah Holland is a 28 y.o. [phone] at [redacted]w[redacted]d being seen today for ongoing prenatal care.  She is currently monitored for the following issues for this high-risk pregnancy and has SIRS (systemic inflammatory response syndrome) ([place]); Supervision of high risk pregnancy, antepartum; Obesity (BMI 30-39.9); Insulin controlled gestational diabetes mellitus (GDM) in third trimester; Obesity complicating peripregnancy, antepartum; and BMI 35.0-35.9,adult on their problem list.  ----------------------------------------------------------------------------------- Patient reports no complaints.  Specifically denies headaches, visual disturbances today, and RUQ pain. Contractions: Not present. Vag. Bleeding: None.  Movement: Present. Leaking Fluid denies.  GDMA2: most 2h pp normal.  All fasting values slightly elevated.  ----------------------------------------------------------------------------------- The following portions of the patient's history were reviewed and updated as appropriate: allergies, current medications, past family history, past medical history, past social history, past surgical history and problem list. Problem list updated.  Objective  Blood pressure 138/88, weight 257 lb (116.6 kg), last menstrual period 12/30/2018. Pregravid weight 213 lb (96.6 kg) Total Weight Gain 44 lb (20 kg) Urinalysis: Urine Protein    Urine Glucose    Fetal Status: Fetal Heart Rate (bpm): 130   Movement: Present     General:  Alert, oriented and cooperative. Patient is in no acute distress.  Skin: Skin is warm and dry. No rash noted.   Cardiovascular: Normal heart rate noted  Respiratory: Normal respiratory effort, no problems with respiration noted  Abdomen: Soft, gravid, appropriate for gestational age. Pain/Pressure: Absent     Pelvic:  Cervical exam deferred        Extremities: Normal range of motion.  Edema: None  Mental Status: Normal mood and affect.  Normal behavior. Normal judgment and thought content.   NST: Baseline FHR: 135 beats/min Variability: moderate Accelerations: present Decelerations: absent Tocometry: not done  Interpretation:  INDICATIONS: gestational diabetes mellitus RESULTS:  A NST procedure was performed with FHR monitoring and a normal baseline established, appropriate time of 20-40 minutes of evaluation, and accels >2 seen w 15x15 characteristics.  Results show a REACTIVE NST.    Assessment   28 y.o. L2G4010 at [redacted]w[redacted]d by  10/06/2019, by Last Menstrual Period presenting for routine prenatal visit  Plan   pregnancy6 Problems (from 12/30/18 to present)    Problem Noted Resolved   BMI 35.0-35.9,adult 08/29/2019 by Will Bonnet, MD No   Obesity complicating peripregnancy, antepartum 08/09/2019 by Malachy Mood, MD No   Insulin controlled gestational diabetes mellitus (GDM) in third trimester 08/02/2019 by Dalia Heading, CNM No   Overview Addendum 09/23/2019  8:54 AM by Will Bonnet, MD    Lantus 20 units bedtime start 9/8  * increased to 25 units 9/15  * increased to 30 units 9/21  * increased to 35 units 9/28  * increased to 40 units 10/16  * increased to 44 units 10/23 Lispro 5 units AC tid start 9/21  * increased to 5 units AC bfst/dinner, 8 units AC lunch 9/28  * increase to 8 units AC all meals 10/14      Supervision of high risk pregnancy, antepartum 04/05/2019 by Rod Can, CNM No   Overview Addendum 08/09/2019 12:30 AM by Dalia Heading, [place] Prenatal Labs  Dating LMP Blood type: B/Positive/-- (04/22 1503)   Genetic Screen NIPS: Negative XY Antibody:Negative (04/22 1503)  Anatomic [place] Complete 05/16/2019 Rubella: 3.96 (04/22 1503) Varicella: Immune  GTT Early:               Third trimester: 204;  3 hr 98/219/204/140- RPR: Non Reactive (04/22 1503)   Rhogam N/A HBsAg: Negative (04/22 1503)   TDaP vaccine        08/02/19               Flu Shot: HIV: Non Reactive  (04/22 1503)   Baby Food                                GBS:   Contraception  Pap: 03/23/2019, NILM  CBB     CS/VBAC    Support Person                  Term labor symptoms and general obstetric precautions including but not limited to vaginal bleeding, contractions, leaking of fluid and fetal movement were reviewed in detail with the patient. Please refer to After Visit Summary for other counseling recommendations.   - increased lantus to 44 units HS.   - monitor BPs closely as her BP was mildly elevated last visit.   Normal today, but just under threshold. No symptoms.  - Discussed ACOG recommendation for IOL between [redacted]w[redacted]d-[redacted]w[redacted]d IF her blood glucose values remain normal on insulin.  Discussed that if her BPs become elevated (GHTN vs preeclampsia), she would need IOL at that time.  Return in about 3 days (around 09/26/2019) for U/S for AFI and routine prenatal with NST.  Thomasene Mohair, MD, [facility], [facility] 09/23/2019 8:54 AM

## 2019-09-27 ENCOUNTER — Ambulatory Visit (INDEPENDENT_AMBULATORY_CARE_PROVIDER_SITE_OTHER): Payer: 59

## 2019-09-27 ENCOUNTER — Encounter: Payer: Self-pay | Admitting: Obstetrics and Gynecology

## 2019-09-27 ENCOUNTER — Ambulatory Visit (INDEPENDENT_AMBULATORY_CARE_PROVIDER_SITE_OTHER): Payer: 59 | Admitting: Obstetrics and Gynecology

## 2019-09-27 ENCOUNTER — Other Ambulatory Visit: Payer: Self-pay

## 2019-09-27 ENCOUNTER — Inpatient Hospital Stay
Admission: EM | Admit: 2019-09-27 | Discharge: 2019-09-30 | DRG: 806 | Disposition: A | Discharge status: Home / Self Care | Payer: 59 | Attending: Certified Nurse Midwife | Admitting: Certified Nurse Midwife

## 2019-09-27 ENCOUNTER — Observation Stay
Admission: EM | Admit: 2019-09-27 | Discharge: 2019-09-27 | Disposition: A | Discharge status: Home / Self Care | Payer: 59 | Source: Home / Self Care | Admitting: Obstetrics and Gynecology

## 2019-09-27 VITALS — BP 142/88 | Wt 258.0 lb

## 2019-09-27 DIAGNOSIS — Z87891 Personal history of nicotine dependence: Secondary | ICD-10-CM

## 2019-09-27 DIAGNOSIS — O163 Unspecified maternal hypertension, third trimester: Secondary | ICD-10-CM | POA: Diagnosis present

## 2019-09-27 DIAGNOSIS — O0990 Supervision of high risk pregnancy, unspecified, unspecified trimester: Secondary | ICD-10-CM

## 2019-09-27 DIAGNOSIS — O24424 Gestational diabetes mellitus in childbirth, insulin controlled: Secondary | ICD-10-CM | POA: Diagnosis present

## 2019-09-27 DIAGNOSIS — O134 Gestational [pregnancy-induced] hypertension without significant proteinuria, complicating childbirth: Secondary | ICD-10-CM | POA: Diagnosis not present

## 2019-09-27 DIAGNOSIS — Z6835 Body mass index (BMI) 35.0-35.9, adult: Secondary | ICD-10-CM

## 2019-09-27 DIAGNOSIS — O99210 Obesity complicating pregnancy, unspecified trimester: Secondary | ICD-10-CM

## 2019-09-27 DIAGNOSIS — O0993 Supervision of high risk pregnancy, unspecified, third trimester: Secondary | ICD-10-CM | POA: Diagnosis not present

## 2019-09-27 DIAGNOSIS — Z3A38 38 weeks gestation of pregnancy: Secondary | ICD-10-CM

## 2019-09-27 DIAGNOSIS — O99213 Obesity complicating pregnancy, third trimester: Secondary | ICD-10-CM | POA: Insufficient documentation

## 2019-09-27 DIAGNOSIS — O24414 Gestational diabetes mellitus in pregnancy, insulin controlled: Secondary | ICD-10-CM | POA: Diagnosis not present

## 2019-09-27 DIAGNOSIS — Z794 Long term (current) use of insulin: Secondary | ICD-10-CM | POA: Insufficient documentation

## 2019-09-27 DIAGNOSIS — D62 Acute posthemorrhagic anemia: Secondary | ICD-10-CM | POA: Diagnosis not present

## 2019-09-27 DIAGNOSIS — E669 Obesity, unspecified: Secondary | ICD-10-CM | POA: Diagnosis present

## 2019-09-27 DIAGNOSIS — O9081 Anemia of the puerperium: Secondary | ICD-10-CM | POA: Diagnosis not present

## 2019-09-27 DIAGNOSIS — Z20828 Contact with and (suspected) exposure to other viral communicable diseases: Secondary | ICD-10-CM | POA: Diagnosis present

## 2019-09-27 DIAGNOSIS — O24419 Gestational diabetes mellitus in pregnancy, unspecified control: Secondary | ICD-10-CM | POA: Insufficient documentation

## 2019-09-27 DIAGNOSIS — O99214 Obesity complicating childbirth: Secondary | ICD-10-CM | POA: Diagnosis present

## 2019-09-27 DIAGNOSIS — Z3490 Encounter for supervision of normal pregnancy, unspecified, unspecified trimester: Secondary | ICD-10-CM | POA: Diagnosis present

## 2019-09-27 DIAGNOSIS — O133 Gestational [pregnancy-induced] hypertension without significant proteinuria, third trimester: Secondary | ICD-10-CM | POA: Insufficient documentation

## 2019-09-27 DIAGNOSIS — O139 Gestational [pregnancy-induced] hypertension without significant proteinuria, unspecified trimester: Secondary | ICD-10-CM | POA: Diagnosis present

## 2019-09-27 HISTORY — DX: Unspecified maternal hypertension, third trimester: O16.3

## 2019-09-27 LAB — COMPREHENSIVE METABOLIC PANEL
ALT: 28 U/L (ref 0–44)
AST: 21 U/L (ref 15–41)
Albumin: 3.2 g/dL — ABNORMAL LOW (ref 3.5–5.0)
Alkaline Phosphatase: 566 U/L — ABNORMAL HIGH (ref 38–126)
Anion gap: 10 (ref 5–15)
BUN: 10 mg/dL (ref 6–20)
CO2: 19 mmol/L — ABNORMAL LOW (ref 22–32)
Calcium: 9 mg/dL (ref 8.9–10.3)
Chloride: 108 mmol/L (ref 98–111)
Creatinine, Ser: 0.58 mg/dL (ref 0.44–1.00)
GFR calc Af Amer: >60 mL/min (ref >60)
GFR calc non Af Amer: >60 mL/min (ref >60)
Glucose, Bld: 84 mg/dL (ref 70–99)
Potassium: 4.1 mmol/L (ref 3.5–5.1)
Sodium: 137 mmol/L (ref 135–145)
Total Bilirubin: 0.5 mg/dL (ref 0.3–1.2)
Total Protein: 6.5 g/dL (ref 6.5–8.1)

## 2019-09-27 LAB — SARS CORONAVIRUS 2 BY RT PCR (HOSPITAL ORDER, PERFORMED IN ~~LOC~~ HOSPITAL LAB): SARS Coronavirus 2: NEGATIVE

## 2019-09-27 LAB — PROTEIN / CREATININE RATIO, URINE
Creatinine, Urine: 150 mg/dL
Protein Creatinine Ratio: 0.09 mg/mg{Cre} (ref 0.00–0.15)
Total Protein, Urine: 14 mg/dL

## 2019-09-27 LAB — CBC
HCT: 35.9 % — ABNORMAL LOW (ref 36.0–46.0)
Hemoglobin: 11.2 g/dL — ABNORMAL LOW (ref 12.0–15.0)
MCH: 25 pg — ABNORMAL LOW (ref 26.0–34.0)
MCHC: 31.2 g/dL (ref 30.0–36.0)
MCV: 80.1 fL (ref 80.0–100.0)
Platelets: 229 10*3/uL (ref 150–400)
RBC: 4.48 MIL/uL (ref 3.87–5.11)
RDW: 14.6 % (ref 11.5–15.5)
WBC: 7 10*3/uL (ref 4.0–10.5)
nRBC: 0 % (ref 0.0–0.2)

## 2019-09-27 LAB — GLUCOSE, CAPILLARY
Glucose-Capillary: 112 mg/dL — ABNORMAL HIGH (ref 70–99)
Glucose-Capillary: 53 mg/dL — ABNORMAL LOW (ref 70–99)
Glucose-Capillary: 71 mg/dL (ref 70–99)

## 2019-09-27 LAB — RAPID HIV SCREEN (HIV 1/2 AB+AG)
HIV 1/2 Antibodies: NONREACTIVE
HIV-1 P24 Antigen - HIV24: NONREACTIVE

## 2019-09-27 LAB — TYPE AND SCREEN
ABO/RH(D): B POS
Antibody Screen: NEGATIVE

## 2019-09-27 MED ORDER — OXYTOCIN 40 UNITS IN NORMAL SALINE INFUSION - SIMPLE MED
1.0000 m[IU]/min | INTRAVENOUS | Status: DC
  Administered 2019-09-28: 1 m[IU]/min via INTRAVENOUS
  Filled 2019-09-27: qty 1000

## 2019-09-27 MED ORDER — INSULIN ASPART 100 UNIT/ML ~~LOC~~ SOLN
10.0000 [IU] | Freq: Three times a day (TID) | SUBCUTANEOUS | Status: DC
  Administered 2019-09-27: 12:00:00 10 [IU] via SUBCUTANEOUS

## 2019-09-27 MED ORDER — LACTATED RINGERS IV SOLN
INTRAVENOUS | Status: DC
  Administered 2019-09-27: 22:00:00 via INTRAVENOUS

## 2019-09-27 MED ORDER — ACETAMINOPHEN 325 MG PO TABS
ORAL_TABLET | ORAL | Status: AC
  Filled 2019-09-27: qty 2

## 2019-09-27 MED ORDER — BUTORPHANOL TARTRATE 1 MG/ML IJ SOLN
1.0000 mg | INTRAMUSCULAR | Status: DC | PRN
  Administered 2019-09-28: 1 mg via INTRAVENOUS
  Filled 2019-09-27: qty 1

## 2019-09-27 MED ORDER — INSULIN ASPART PROT & ASPART (70-30 MIX) 100 UNIT/ML ~~LOC~~ SUSP: 10.0000 [IU] | Freq: Three times a day (TID) | SUBCUTANEOUS | Status: DC

## 2019-09-27 MED ORDER — SOD CITRATE-CITRIC ACID 500-334 MG/5ML PO SOLN: 30.0000 mL | ORAL | Status: DC | PRN

## 2019-09-27 MED ORDER — ACETAMINOPHEN 325 MG PO TABS
650.0000 mg | ORAL_TABLET | Freq: Once | ORAL | Status: AC
  Administered 2019-09-27: 650 mg via ORAL

## 2019-09-27 MED ORDER — LIDOCAINE HCL (PF) 1 % IJ SOLN: 30.0000 mL | INTRAMUSCULAR | Status: DC | PRN

## 2019-09-27 MED ORDER — LACTATED RINGERS IV SOLN
500.0000 mL | INTRAVENOUS | Status: DC | PRN
  Administered 2019-09-28: 500 mL via INTRAVENOUS

## 2019-09-27 MED ORDER — INSULIN GLARGINE 100 UNIT/ML SOLOSTAR PEN: 50.0000 [IU] | PEN_INJECTOR | Freq: Every day | SUBCUTANEOUS | 11 refills | Status: DC

## 2019-09-27 MED ORDER — ONDANSETRON HCL 4 MG/2ML IJ SOLN: 4.0000 mg | Freq: Four times a day (QID) | INTRAMUSCULAR | Status: DC | PRN

## 2019-09-27 MED ORDER — OXYTOCIN BOLUS FROM INFUSION
500.0000 mL | Freq: Once | INTRAVENOUS | Status: AC
  Administered 2019-09-28: 500 mL via INTRAVENOUS

## 2019-09-27 MED ORDER — TERBUTALINE SULFATE 1 MG/ML IJ SOLN: 0.2500 mg | Freq: Once | INTRAMUSCULAR | Status: DC | PRN

## 2019-09-27 MED ORDER — INSULIN ASPART 100 UNIT/ML ~~LOC~~ SOLN
SUBCUTANEOUS | Status: AC
  Filled 2019-09-27: qty 1

## 2019-09-27 MED ORDER — OXYTOCIN 40 UNITS IN NORMAL SALINE INFUSION - SIMPLE MED: 2.5000 [IU]/h | INTRAVENOUS | Status: DC

## 2019-09-27 MED ORDER — MISOPROSTOL 25 MCG QUARTER TABLET
25.0000 ug | ORAL_TABLET | ORAL | Status: DC | PRN
  Administered 2019-09-27 – 2019-09-28 (×2): 25 ug via VAGINAL
  Filled 2019-09-27 (×3): qty 1

## 2019-09-27 NOTE — OB Triage Note (Signed)
Pt was sent from OBGYN office for [facility] eval. Pt is [redacted]w[redacted]d [id number]. Pt denies headache, vision changes, or right upper gastric pain. Pt denies LOF, vaginal bleeding, ands states positive fetal movement.External monitors applied and assessing.  Initial FHR 130. VSS. Initial BP 130/90 Temp 98.6. C.Gutierrez, CNM made aware

## 2019-09-27 NOTE — Final Progress Note (Signed)
Physician Final Progress Note  Patient ID: Sarah Holland MRN: [id number] DOB/AGE: 1991-09-02 28 y.o.  Admit date: 09/27/2019 Admitting provider: Homero Fellers, MD/ Jesus Genera. Danise Mina, [place] Discharge date: 09/27/2019   Admission Diagnoses: IUP at 38wk5d with GDM on insulin Elevated blood pressures in third trimester.  Discharge Diagnoses:  IUP at 38wk5d, not well controlled on insulin Gestational hypertension  Consults: None  Significant Findings/ Diagnostic Studies: [name] is a 28 y.o. [phone] Black female with EDC=10/06/2019 at [redacted]w[redacted]d dated by LMP=13wk5d ultrasound.  Her pregnancy has been significantly complicated by gestational diabetes and obesity.  Her diabetes was diagnosed in the third trimester and was not controlled on diet. She is requiring increasing doses of Lantus and Humalog. Her Lantus was just increased from 44 U to 50U at hs and her Humalog was increased from 8 to 10U. Last growth scan 10/12 gave EFW 5#15oz (50th%). Her AFI today was normal.  She has also started having some mildly elevated blood pressures at 37wk 6d.  She presented to L&D for a pre eclampsia evaluation after her blood pressure in the office today was 142/88. She denied headache, blurred vision, RUQ pain, nausea/vomiting, or SOB on arrival to L&D. She developed a headache while there prior to lunch which she felt was from not eating. Baby active  Exam: General: [name] in NAD.  Vital Signs: Temp:  [98.6 F (37 C)] 98.6 F (37 C) (10/27 1100) Pulse Rate:  [88-99] 97 (10/27 1300) Resp:  [18] 18 (10/27 1144) BP: (126-145)/(77-97) 141/81 (10/27 1300) Weight:  [117 kg] 117 kg (10/27 1106)   FHT: 125 to 130 with accelerations to 150s, moderate variability Toco: rare contraction Cervix: closed/30%/OOP/mid/soft Results for orders placed or performed during the hospital encounter of 09/27/19 (from the past 24 hour(s))  Protein / creatinine ratio, urine     Status: None   Collection  Time: 09/27/19 11:10 AM  Result Value Ref Range   Creatinine, Urine 150 mg/dL   Total Protein, Urine 14 mg/dL   Protein Creatinine Ratio 0.09 0.00 - 0.15 mg/mg[Cre]  Type and screen [place]     Status: None   Collection Time: 09/27/19 11:22 AM  Result Value Ref Range   ABO/RH(D) B POS    Antibody Screen NEG    Sample Expiration      09/30/2019,2359 Performed at [facility], [address] [place], [place] [address]   Comprehensive metabolic panel     Status: Abnormal   Collection Time: 09/27/19 11:22 AM  Result Value Ref Range   Sodium 137 135 - 145 mmol/L   Potassium 4.1 3.5 - 5.1 mmol/L   Chloride 108 98 - 111 mmol/L   CO2 19 (L) 22 - 32 mmol/L   Glucose, Bld 84 70 - 99 mg/dL   BUN 10 6 - 20 mg/dL   Creatinine, Ser 0.58 0.44 - 1.00 mg/dL   Calcium 9.0 8.9 - 10.3 mg/dL   Total Protein 6.5 6.5 - 8.1 g/dL   Albumin 3.2 (L) 3.5 - 5.0 g/dL   AST 21 15 - 41 U/L   ALT 28 0 - 44 U/L   Alkaline Phosphatase 566 (H) 38 - 126 U/L   Total Bilirubin 0.5 0.3 - 1.2 mg/dL   GFR calc non Af Amer >60 >60 mL/min   GFR calc Af Amer >60 >60 mL/min   Anion gap 10 5 - 15  CBC     Status: Abnormal   Collection Time: 09/27/19 11:22  AM  Result Value Ref Range   WBC 7.0 4.0 - 10.5 K/uL   RBC 4.48 3.87 - 5.11 MIL/uL   Hemoglobin 11.2 (L) 12.0 - 15.0 g/dL   HCT 32.4 (L) 40.1 - 02.7 %   MCV 80.1 80.0 - 100.0 fL   MCH 25.0 (L) 26.0 - 34.0 pg   MCHC 31.2 30.0 - 36.0 g/dL   RDW 25.3 66.4 - 40.3 %   Platelets 229 150 - 400 K/uL   nRBC 0.0 0.0 - 0.2 %  Assessment: IUP at 38wk 5d with gestational hypertension  GDMA2-difficulty controlling on insulin NST reactive  P: Patient will be discharged and return tonight for an induction of labor.    Procedures: none  Discharge Condition: stable  Disposition:   Diet: Diabetic diet  Discharge Activity: Activity as tolerated  Discharge Instructions    Discharge   Complete by: As directed      Allergies as of  09/27/2019   No Active Allergies     Medication List    TAKE these medications   glucose blood test strip Check blood sugars qid   Insulin Glargine 100 UNIT/ML Solostar Pen Commonly known as: LANTUS Inject 50 Units into the skin at bedtime.   insulin lispro 100 UNIT/ML KwikPen Commonly known as: HUMALOG Inject 0.04 mLs (4 Units total) into the skin 3 (three) times daily. What changed: how much to take   Insulin Pen Needle 32G X 4 MM Misc 1 Units by Does not apply route 4 (four) times daily.   onetouch ultrasoft lancets Use as instructed   PRENATAL VITAMINS PO Take 1 capsule by mouth daily.        Total time spent taking care of this patient: 20 minutes  Signed: Farrel Conners 09/27/2019, 5:47 PM

## 2019-09-27 NOTE — H&P (Signed)
OB History & Physical   History of Present Illness:  Chief Complaint:  Sent from office for evaluation of elevated blood pressure. HPI:  Sarah Holland is a 28 y.o. [phone] Black female with EDC=10/06/2019 at 10619w5d dated by LMP=13wk5d ultrasound.  Her pregnancy has been significantly complicated by gestational diabetes and obesity. Her diabetes was diagnosed in the third trimester and was not controlled on diet. She is requiring increasing doses of Lantus and Humalog. Her Lantus was just increased from 44 U to 50U at hs and her Humalog was increased from 8 to 10U. Last growth scan 10/12 gave EFW 5#15oz (50th%). Her AFI today was normal.  She has also started having some mildly elevated blood pressures at 37wk 6d.  She presents to L&D for a pre eclampsia evaluation after her blood pressure in the office today was 142/88. She denied headache, blurred vision, RUQ pain, nausea/vomiting, or SOB on arrival to L&D. She developed a headache while there prior to lunch which she felt was from not eating. Baby active  Prenatal care site: [facility] has been remarkable for  [facility] Prenatal Labs  Dating LMP Blood type: B/Positive/-- (04/22 1503)   Genetic Screen NIPS: Negative XY Antibody:Negative (04/22 1503)  Anatomic US Complete 05/16/2019 Rubella: 3.96 (04/22 1503) Varicella: Immune  GTT Early:               Third trimester: 204; 3 hr 98/219/204/140- RPR: Non Reactive (04/22 1503)   Rhogam N/A HBsAg: Negative (04/22 1503)   TDaP vaccine        08/02/19               Flu Shot: Declines HIV: Non Reactive (04/22 1503)   Baby Food          Breast                      GBS: negative  Contraception  Pap: 03/23/2019, NILM  CBB     CS/VBAC    Support Person             Maternal Medical History:   Past Medical History:  Diagnosis Date  . Gestational diabetes   . Obesity (BMI 30-39.9)     Past Surgical History:  Procedure Laterality Date  . APPENDECTOMY      No  Active Allergies  Prior to Admission medications   Medication Sig Start Date End Date Taking? Authorizing Provider  glucose blood test strip Check blood sugars qid 07/26/19  Yes Farrel ConnersGutierrez, Meliyah Simon, CNM  Insulin Glargine (LANTUS) 100 UNIT/ML Solostar Pen Inject 50 Units into the skin at bedtime. 09/27/19  Yes Conard NovakJackson, Stephen D, MD  insulin lispro (HUMALOG) 100 UNIT/ML KwikPen Inject 0.04 mLs (4 Units total) into the skin 3 (three) times daily. Patient taking differently: Inject 10 Units into the skin 3 (three) times daily.  08/09/19  Yes Vena AustriaStaebler, Andreas, MD  Insulin Pen Needle 32G X 4 MM MISC 1 Units by Does not apply route 4 (four) times daily. 08/09/19  Yes Vena AustriaStaebler, Andreas, MD  Lancets [facility](ONETOUCH ULTRASOFT) lancets Use as instructed 07/26/19  Yes Farrel ConnersGutierrez, Kel Senn, CNM  Prenatal Vit-Fe Fumarate-FA (PRENATAL VITAMINS PO) Take 1 capsule by mouth daily.   Yes [provider]          Social History: She  reports that she quit smoking about 21 months ago. Her smoking use included cigarettes. She has a 0.75 pack-year smoking history. She has never used smokeless tobacco.  She reports that she does not drink alcohol.  Family History: family history includes Diabetes in her maternal grandmother.   Review of Systems: Negative x 10 systems reviewed except as noted in the HPI.      Physical Exam:  Vital Signs: BP 126/79 (BP Location: Right Arm)   Pulse 94   Temp 98.6 F (37 C) (Oral)   Resp 18   Ht 5\' 5"  (1.651 m)   Wt 117 kg   LMP 12/30/2018 Comment: neg preg test  BMI 42.93 kg/m   Patient Vitals for the past 24 hrs:  BP Temp Temp src Pulse Resp Height Weight  09/27/19 1300 (!) 141/81 - - 97 - - -  09/27/19 1245 (!) 145/97 - - 99 - - -  09/27/19 1214 126/79 - - 94 - - -  09/27/19 1159 (!) 137/96 - - 98 - - -  09/27/19 1144 126/77 - - 88 18 - -  09/27/19 1115 134/87 - - 98 - - -  09/27/19 1106 - - - - - 5\' 5"  (1.651 m) 117 kg  09/27/19 1100 130/90 98.6 F (37 C) Oral 94  18 - -   General: [name] in no acute distress.  HEENT: normocephalic, atraumatic Heart: regular rate & rhythm.  No murmurs/rubs/gallops Lungs: clear to auscultation bilaterally Abdomen: soft, gravid, non-tender;  EFW: 7# Pelvic:   External: Normal external female genitalia  Cervix: closed/ 30%/OOP/soft/ mid  Extremities: non-tender, symmetric, +1 edema bilaterally.  DTRs: +1  Neurologic: Alert & oriented x 3.   Baseline FHR: 125-130 baseline with accelerations to 150s, moderate variability Toco: rare contraction  Results for orders placed or performed during the hospital encounter of 09/27/19 (from the past 24 hour(s))  Protein / creatinine ratio, urine     Status: None   Collection Time: 09/27/19 11:10 AM  Result Value Ref Range   Creatinine, Urine 150 mg/dL   Total Protein, Urine 14 mg/dL   Protein Creatinine Ratio 0.09 0.00 - 0.15 mg/mg[Cre]  Type and screen [facility]     Status: None   Collection Time: 09/27/19 11:22 AM  Result Value Ref Range   ABO/RH(D) B POS    Antibody Screen NEG    Sample Expiration      09/30/2019,2359 Performed at [facility], [address] [place], [address] [place]   Comprehensive metabolic panel     Status: Abnormal   Collection Time: 09/27/19 11:22 AM  Result Value Ref Range   Sodium 137 135 - 145 mmol/L   Potassium 4.1 3.5 - 5.1 mmol/L   Chloride 108 98 - 111 mmol/L   CO2 19 (L) 22 - 32 mmol/L   Glucose, Bld 84 70 - 99 mg/dL   BUN 10 6 - 20 mg/dL   Creatinine, Ser 42683 0.44 - 1.00 mg/dL   Calcium 9.0 8.9 - 09/29/19 mg/dL   Total Protein 6.5 6.5 - 8.1 g/dL   Albumin 3.2 (L) 3.5 - 5.0 g/dL   AST 21 15 - 41 U/L   ALT 28 0 - 44 U/L   Alkaline Phosphatase 566 (H) 38 - 126 U/L   Total Bilirubin 0.5 0.3 - 1.2 mg/dL   GFR calc non Af Amer >60 >60 mL/min   GFR calc Af Amer >60 >60 mL/min   Anion gap 10 5 - 15  CBC     Status: Abnormal   Collection Time: 09/27/19 11:22 AM  Result Value Ref Range   WBC 7.0  4.0 -  10.5 K/uL   RBC 4.48 3.87 - 5.11 MIL/uL   Hemoglobin 11.2 (L) 12.0 - 15.0 g/dL   HCT 35.9 (L) 36.0 - 46.0 %   MCV 80.1 80.0 - 100.0 fL   MCH 25.0 (L) 26.0 - 34.0 pg   MCHC 31.2 30.0 - 36.0 g/dL   RDW 14.6 11.5 - 15.5 %   Platelets 229 150 - 400 K/uL   nRBC 0.0 0.0 - 0.2 %   US Ob Limited  Result Date: 09/27/2019 Patient Name: Sarah Holland DOB: Oct 15, 1991 MRN: [id number] ULTRASOUND REPORT Location: [facility] Date of Service: 09/27/2019 Indications:AFI Findings: Sarah Holland intrauterine pregnancy is visualized with FHR at 141 BPM. Fetal presentation is Cephalic. Placenta: posterior. Grade: 1 AFI: 12.3 cm Impression: 1. [redacted]w[redacted]d Viable Singleton Intrauterine pregnancy dated by previously established criteria. 2. AFI is 12.3 cm. Gweneth Dimitri, RT The ultrasound images and findings were reviewed by me and I agree with the above report. Prentice Docker, MD, [facility], [facility] 09/27/2019 10:16 AM     Assessment:  Sarah Holland is a 28 y.o. [phone] female at [redacted]w[redacted]d with gestational hypertension and GDM requiring increasing doses of insulin.   Plan:  1. Patient will be discharged at this time and return to L&D tonight for IOL 1. Explained risks of induction vs risks of continued observation. Aware of the risks of induction including hyperstimulation, fetal intolerance, and Cesarean section. 2. Plan Cytotec induction. Discussed induction process and methods used.  2. Labs for admission drawn with Preeclampsia labs 3. GBS negative.   4. AB POS/ RI/ VI 5. Breast. 6. TDAP UTD   Dalia Heading  09/27/2019 12:36 PM

## 2019-09-27 NOTE — Progress Notes (Addendum)
Pt received discharge order and instructions. Pt will return to L&D for IOL @2000 . C.Gutierrez, CNM and C.Schuman, MD made aware

## 2019-09-27 NOTE — Progress Notes (Signed)
Routine Prenatal Care Visit  Subjective  Sarah Holland is a 28 y.o. [phone] at [redacted]w[redacted]d being seen today for ongoing prenatal care.  She is currently monitored for the following issues for this high-risk pregnancy and has SIRS (systemic inflammatory response syndrome) (HCC); Supervision of high risk pregnancy, antepartum; Obesity (BMI 30-39.9); Insulin controlled gestational diabetes mellitus (GDM) in third trimester; Obesity complicating peripregnancy, antepartum; and BMI 35.0-35.9,adult on their problem list.  ----------------------------------------------------------------------------------- Patient reports no complaints.  Denies HA, visual changes, RUQ pain.   No episodes of hypoglycemia. Contractions: Not present. Vag. Bleeding: None.  Movement: Present. Leaking Fluid denies.  GDMA2: all values out of range U/s today shows normal AFI ----------------------------------------------------------------------------------- The following portions of the patient's history were reviewed and updated as appropriate: allergies, current medications, past family history, past medical history, past social history, past surgical history and problem list. Problem list updated.  Objective  Blood pressure (!) 142/88, weight 258 lb (117 kg), last menstrual period 12/30/2018. Pregravid weight 213 lb (96.6 kg) Total Weight Gain 45 lb (20.4 kg) Urinalysis: Urine Protein    Urine Glucose    Fetal Status: Fetal Heart Rate (bpm): 130   Movement: Present  Presentation: Vertex  General:  Alert, oriented and cooperative. Patient is in no acute distress.  Skin: Skin is warm and dry. No rash noted.   Cardiovascular: Normal heart rate noted  Respiratory: Normal respiratory effort, no problems with respiration noted  Abdomen: Soft, gravid, appropriate for gestational age. Pain/Pressure: Absent     Pelvic:  Cervical exam deferred        Extremities: Normal range of motion.  Edema: None  Mental Status: Normal mood  and affect. Normal behavior. Normal judgment and thought content.   NST: Baseline FHR: 130 beats/min Variability: moderate Accelerations: present Decelerations: absent Tocometry: not done  Interpretation:  INDICATIONS: gestational diabetes mellitus RESULTS:  A NST procedure was performed with FHR monitoring and a normal baseline established, appropriate time of 20-40 minutes of evaluation, and accels >2 seen w 15x15 characteristics.  Results show a REACTIVE NST.    Imaging Results US Ob Limited  Result Date: 09/27/2019 Patient Name: YSABELA Holland DOB: 11-15-1991 MRN: [id number] ULTRASOUND REPORT Location: [facility] Date of Service: 09/27/2019 Indications:AFI Findings: Mason Jim intrauterine pregnancy is visualized with FHR at 141 BPM. Fetal presentation is Cephalic. Placenta: posterior. Grade: 1 AFI: 12.3 cm Impression: 1. [redacted]w[redacted]d Viable Singleton Intrauterine pregnancy dated by previously established criteria. 2. AFI is 12.3 cm. Deanna Artis, RT The ultrasound images and findings were reviewed by me and I agree with the above report. Thomasene Mohair, MD, [facility], [facility] 09/27/2019 10:16 AM       Assessment   28 y.o. [id number] at [redacted]w[redacted]d by  10/06/2019, by Last Menstrual Period presenting for routine prenatal visit  Plan   pregnancy6 Problems (from 12/30/18 to present)    Problem Noted Resolved   BMI 35.0-35.9,adult 08/29/2019 by Conard Novak, MD No   Obesity complicating peripregnancy, antepartum 08/09/2019 by Vena Austria, MD No   Insulin controlled gestational diabetes mellitus (GDM) in third trimester 08/02/2019 by Farrel Conners, CNM No   Overview Addendum 09/27/2019 10:32 AM by Conard Novak, MD    Lantus 20 units bedtime start 9/8  * increased to 25 units 9/15  * increased to 30 units 9/21  * increased to 35 units 9/28  * increased to 40 units 10/16  * increased to 44 units 10/23  * increased to 50 units  10/27 Lispro 5  units AC tid start 9/21  * increased to 5 units AC bfst/dinner, 8 units AC lunch 9/28  * increase to 8 units AC all meals 10/14  * increased to 10 units AC all meals 10/27      Supervision of high risk pregnancy, antepartum 04/05/2019 by Rod Can, CNM No   Overview Addendum 08/09/2019 12:30 AM by Dalia Heading, [place] Prenatal Labs  Dating LMP Blood type: B/Positive/-- (04/22 1503)   Genetic Screen NIPS: Negative XY Antibody:Negative (04/22 1503)  Anatomic [place] Complete 05/16/2019 Rubella: 3.96 (04/22 1503) Varicella: Immune  GTT Early:               Third trimester: 204; 3 hr 98/219/204/140- RPR: Non Reactive (04/22 1503)   Rhogam N/A HBsAg: Negative (04/22 1503)   TDaP vaccine        08/02/19               Flu Shot: HIV: Non Reactive (04/22 1503)   Baby Food                                GBS:   Contraception  Pap: 03/23/2019, NILM  CBB     CS/VBAC    Support Person                  Term labor symptoms and general obstetric precautions including but not limited to vaginal bleeding, contractions, leaking of fluid and fetal movement were reviewed in detail with the patient. Please refer to After Visit Summary for other counseling recommendations.   - to hospital for preeclampsia workup - if sent home, increased Lantus to 50 units QHS (up from 44), Lispro to 10 units AC meals (up from 8). - strong consideration for IOL soon, if no GHTN or preeclampsia, given lack of control of GDM.  Return in about 3 days (around 09/30/2019) for Routine Prenatal Appointment with NST.  Prentice Docker, MD, [facility], [facility] 09/27/2019 10:32 AM

## 2019-09-27 NOTE — H&P (Signed)
H&P  Sarah Holland is an 28 y.o. female.  HPI: Sarah Holland is a 28 y.o. [phone] Black female with EDC=10/06/2019 at [redacted]w[redacted]d dated by LMP=13wk5d ultrasound.  Her pregnancy has been significantly complicated by gestational diabetes and obesity. Her diabetes was diagnosed in the third trimester and was not controlled on diet. She is requiring increasing doses of Lantus and Humalog. Her Lantus was just increased from 44 U to 50U at hs and her Humalog was increased from 8 to 10U. Last growth scan 10/12 gave EFW 5#15oz (50th%). Her AFI today was normal.  She has also started having some mildly elevated blood pressures at 37wk 6d.  She presents to L&D earlier today for a pre eclampsia evaluation after her blood pressure in the office today was 142/88. She denied headache, blurred vision, RUQ pain, nausea/vomiting, or SOB on arrival to L&D. She developed a headache while there prior to lunch which she felt was from not eating. Baby active. She was discharged home with a plan to return this evening for an induction.   pregnancy6 Problems (from 12/30/18 to present)    Problem Noted Resolved   BMI 35.0-35.9,adult 08/29/2019 by Conard Novak, MD No   Obesity complicating peripregnancy, antepartum 08/09/2019 by Vena Austria, MD No   Insulin controlled gestational diabetes mellitus (GDM) in third trimester 08/02/2019 by Farrel Conners, CNM No   Overview Addendum 09/27/2019 10:32 AM by Conard Novak, MD    Lantus 20 units bedtime start 9/8  * increased to 25 units 9/15  * increased to 30 units 9/21  * increased to 35 units 9/28  * increased to 40 units 10/16  * increased to 44 units 10/23  * increased to 50 units 10/27 Lispro 5 units AC tid start 9/21  * increased to 5 units AC bfst/dinner, 8 units AC lunch 9/28  * increase to 8 units AC all meals 10/14  * increased to 10 units AC all meals 10/27      Supervision of high risk pregnancy, antepartum 04/05/2019 by Tresea Mall, CNM No    Overview Addendum 08/09/2019 12:30 AM by Farrel Conners, CNM    [facility] Prenatal Labs  Dating LMP Blood type: B/Positive/-- (04/22 1503)   Genetic Screen NIPS: Negative XY Antibody:Negative (04/22 1503)  Anatomic [place] Complete 05/16/2019 Rubella: 3.96 (04/22 1503) Varicella: Immune  GTT Early:               Third trimester: 204; 3 hr 98/219/204/140- RPR: Non Reactive (04/22 1503)   Rhogam N/A HBsAg: Negative (04/22 1503)   TDaP vaccine        08/02/19               Flu Shot: HIV: Non Reactive (04/22 1503)   Baby Food                                GBS: negative  Contraception  Pap: 03/23/2019, NILM  CBB     CS/VBAC    Support Person                  Past Medical History:  Diagnosis Date  . Gestational diabetes   . Obesity (BMI 30-39.9)     Past Surgical History:  Procedure Laterality Date  . APPENDECTOMY      Family History  Problem Relation Age of Onset  . Diabetes Maternal Grandmother     Social History:  reports  that she quit smoking about 21 months ago. Her smoking use included cigarettes. She has a 0.75 pack-year smoking history. She has never used smokeless tobacco. She reports that she does not drink alcohol. No history on file for drug.  Allergies: No Active Allergies  Medications: I have reviewed the patient's current medications.  Results for orders placed or performed during the hospital encounter of 09/27/19 (from the past 48 hour(s))  SARS Coronavirus 2 by RT PCR (hospital order, [facility]) Nasopharyngeal Nasopharyngeal Swab     Status: None   Collection Time: 09/27/19  9:05 PM   Specimen: Nasopharyngeal Swab  Result Value Ref Range   SARS Coronavirus 2 NEGATIVE NEGATIVE    Comment: (NOTE) If result is NEGATIVE SARS-CoV-2 target nucleic acids are NOT DETECTED. The SARS-CoV-2 RNA is generally detectable in upper and lower  respiratory specimens during the acute phase of infection. The lowest  concentration of SARS-CoV-2  viral copies this assay can detect is 250  copies / mL. A negative result does not preclude SARS-CoV-2 infection  and should not be used as the sole basis for treatment or other  patient management decisions.  A negative result may occur with  improper specimen collection / handling, submission of specimen other  than nasopharyngeal swab, presence of viral mutation(s) within the  areas targeted by this assay, and inadequate number of viral copies  (<250 copies / mL). A negative result must be combined with clinical  observations, patient history, and epidemiological information. If result is POSITIVE SARS-CoV-2 target nucleic acids are DETECTED. The SARS-CoV-2 RNA is generally detectable in upper and lower  respiratory specimens dur ing the acute phase of infection.  Positive  results are indicative of active infection with SARS-CoV-2.  Clinical  correlation with patient history and other diagnostic information is  necessary to determine patient infection status.  Positive results do  not rule out bacterial infection or co-infection with other viruses. If result is PRESUMPTIVE POSTIVE SARS-CoV-2 nucleic acids MAY BE PRESENT.   A presumptive positive result was obtained on the submitted specimen  and confirmed on repeat testing.  While 2019 novel coronavirus  (SARS-CoV-2) nucleic acids may be present in the submitted sample  additional confirmatory testing may be necessary for epidemiological  and / or clinical management purposes  to differentiate between  SARS-CoV-2 and other Sarbecovirus currently known to infect humans.  If clinically indicated additional testing with an alternate test  methodology [phone]([id number]) is advised. The SARS-CoV-2 RNA is generally  detectable in upper and lower respiratory sp ecimens during the acute  phase of infection. The expected result is Negative. Fact Sheet for Patients:  BoilerBrush.com.cyhttps://www.fda.gov/media/136312/download Fact Sheet for Healthcare  Providers: https://pope.com/https://www.fda.gov/media/136313/download This test is not yet approved or cleared by the [place] FDA and has been authorized for detection and/or diagnosis of SARS-CoV-2 by FDA under an Emergency Use Authorization (EUA).  This EUA will remain in effect (meaning this test can be used) for the duration of the COVID-19 declaration under Section 564(b)(1) of the Act, 21 U.S.C. section 360bbb-3(b)(1), unless the authorization is terminated or revoked sooner. Performed at [facility], [address] [place], [place] [address]   Glucose, capillary     Status: Abnormal   Collection Time: 09/27/19  9:31 PM  Result Value Ref Range   Glucose-Capillary 53 (L) 70 - 99 mg/dL  Glucose, capillary     Status: None   Collection Time: 09/27/19 10:06 PM  Result Value Ref Range  Glucose-Capillary 71 70 - 99 mg/dL    US Ob Limited  Result Date: 09/27/2019 Patient Name: AREAL COCHRANE DOB: 11/11/1991 MRN: [id number] ULTRASOUND REPORT Location: [facility] Date of Service: 09/27/2019 Indications:AFI Findings: Nelda Marseille intrauterine pregnancy is visualized with FHR at 141 BPM. Fetal presentation is Cephalic. Placenta: posterior. Grade: 1 AFI: 12.3 cm Impression: 1. [redacted]w[redacted]d Viable Singleton Intrauterine pregnancy dated by previously established criteria. 2. AFI is 12.3 cm. Gweneth Dimitri, RT The ultrasound images and findings were reviewed by me and I agree with the above report. Prentice Docker, MD, [facility], [facility] 09/27/2019 10:16 AM      Review of Systems  Constitutional: Negative for chills, fever, malaise/fatigue and weight loss.  HENT: Negative for congestion, hearing loss and sinus pain.   Eyes: Negative for blurred vision and double vision.  Respiratory: Negative for cough, sputum production, shortness of breath and wheezing.   Cardiovascular: Negative for chest pain, palpitations, orthopnea and leg swelling.  Gastrointestinal:  Negative for abdominal pain, constipation, diarrhea, nausea and vomiting.  Genitourinary: Negative for dysuria, flank pain, frequency, hematuria and urgency.  Musculoskeletal: Negative for back pain, falls and joint pain.  Skin: Negative for itching and rash.  Neurological: Negative for dizziness and headaches.  Psychiatric/Behavioral: Negative for depression, substance abuse and suicidal ideas. The patient is not nervous/anxious.    Blood pressure 140/87, pulse 97, temperature 98.6 F (37 C), temperature source Oral, resp. rate 18, height 5\' 5"  (1.651 m), weight 117 kg, last menstrual period 12/30/2018. Physical Exam  Nursing note and vitals reviewed. Constitutional: She is oriented to person, place, and time. She appears well-developed and well-nourished.  HENT:  Head: Normocephalic and atraumatic.  Cardiovascular: Normal rate and regular rhythm.  Respiratory: Effort normal and breath sounds normal.  GI: Soft. Bowel sounds are normal.  Musculoskeletal: Normal range of motion.  Neurological: She is alert and oriented to person, place, and time.  Skin: Skin is warm and dry.  Psychiatric: She has a normal mood and affect. Her behavior is normal. Judgment and thought content normal.   NST: 120 bpm baseline, moderate variability, 15x15 accelerations, no decelerations. Tocometer : irregular  Assessment/Plan:  28 yo [id number] [redacted]w[redacted]d. Pregancy complicated by gestational hypertension and gestational diabetes requiring increasing amounts of insulin to control blood glucose leves.  1. Induction of labor- will start with cytotec 2. Gestational diabetes- will monitor blood glucose per protocol. Will start IV insulin if needed for tight glucose control. 3. GBS negative.   4. AB POS/ RI/ VI 5. Breast. 6. TDAP UTD    Zylah Elsbernd R Sujey Gundry 09/27/2019, 10:55 PM

## 2019-09-28 ENCOUNTER — Encounter: Payer: Self-pay | Admitting: *Deleted

## 2019-09-28 ENCOUNTER — Inpatient Hospital Stay: Payer: 59 | Admitting: Anesthesiology

## 2019-09-28 DIAGNOSIS — O99214 Obesity complicating childbirth: Secondary | ICD-10-CM

## 2019-09-28 DIAGNOSIS — Z3490 Encounter for supervision of normal pregnancy, unspecified, unspecified trimester: Secondary | ICD-10-CM | POA: Diagnosis present

## 2019-09-28 DIAGNOSIS — O139 Gestational [pregnancy-induced] hypertension without significant proteinuria, unspecified trimester: Secondary | ICD-10-CM

## 2019-09-28 HISTORY — DX: Gestational (pregnancy-induced) hypertension without significant proteinuria, unspecified trimester: O13.9

## 2019-09-28 HISTORY — DX: Encounter for supervision of normal pregnancy, unspecified, unspecified trimester: Z34.90

## 2019-09-28 LAB — CBC
HCT: 35.3 % — ABNORMAL LOW (ref 36.0–46.0)
Hemoglobin: 11.1 g/dL — ABNORMAL LOW (ref 12.0–15.0)
MCH: 25.5 pg — ABNORMAL LOW (ref 26.0–34.0)
MCHC: 31.4 g/dL (ref 30.0–36.0)
MCV: 81 fL (ref 80.0–100.0)
Platelets: 224 10*3/uL (ref 150–400)
RBC: 4.36 MIL/uL (ref 3.87–5.11)
RDW: 14.8 % (ref 11.5–15.5)
WBC: 6.3 10*3/uL (ref 4.0–10.5)
nRBC: 0 % (ref 0.0–0.2)

## 2019-09-28 LAB — GLUCOSE, CAPILLARY
Glucose-Capillary: 107 mg/dL — ABNORMAL HIGH (ref 70–99)
Glucose-Capillary: 122 mg/dL — ABNORMAL HIGH (ref 70–99)
Glucose-Capillary: 110 mg/dL — ABNORMAL HIGH (ref 70–99)
Glucose-Capillary: 93 mg/dL (ref 70–99)
Glucose-Capillary: 68 mg/dL — ABNORMAL LOW (ref 70–99)
Glucose-Capillary: 67 mg/dL — ABNORMAL LOW (ref 70–99)
Glucose-Capillary: 88 mg/dL (ref 70–99)
Glucose-Capillary: 103 mg/dL — ABNORMAL HIGH (ref 70–99)

## 2019-09-28 LAB — RPR: RPR Ser Ql: NONREACTIVE

## 2019-09-28 MED ORDER — OXYTOCIN 40 UNITS IN NORMAL SALINE INFUSION - SIMPLE MED: 1.0000 m[IU]/min | INTRAVENOUS | Status: DC

## 2019-09-28 MED ORDER — FENTANYL 2.5 MCG/ML W/ROPIVACAINE 0.15% IN NS 100 ML EPIDURAL (ARMC)
EPIDURAL | Status: AC
  Filled 2019-09-28: qty 100

## 2019-09-28 MED ORDER — DIBUCAINE (PERIANAL) 1 % EX OINT: 1.0000 "application " | TOPICAL_OINTMENT | CUTANEOUS | Status: DC | PRN

## 2019-09-28 MED ORDER — LIDOCAINE-EPINEPHRINE (PF) 1.5 %-1:200000 IJ SOLN
INTRAMUSCULAR | Status: DC | PRN
  Administered 2019-09-28: 3 mL via EPIDURAL

## 2019-09-28 MED ORDER — PRENATAL MULTIVITAMIN CH
1.0000 | ORAL_TABLET | Freq: Every day | ORAL | Status: DC
  Administered 2019-09-29 – 2019-09-30 (×2): 1 via ORAL
  Filled 2019-09-28 (×2): qty 1

## 2019-09-28 MED ORDER — SENNOSIDES-DOCUSATE SODIUM 8.6-50 MG PO TABS
2.0000 | ORAL_TABLET | ORAL | Status: DC
  Administered 2019-09-28 – 2019-09-30 (×3): 2 via ORAL
  Filled 2019-09-28 (×3): qty 2

## 2019-09-28 MED ORDER — ONDANSETRON HCL 4 MG PO TABS: 4.0000 mg | ORAL_TABLET | ORAL | Status: DC | PRN

## 2019-09-28 MED ORDER — MISOPROSTOL 200 MCG PO TABS
ORAL_TABLET | ORAL | Status: AC
  Filled 2019-09-28: qty 4

## 2019-09-28 MED ORDER — LIDOCAINE HCL (PF) 1 % IJ SOLN
INTRAMUSCULAR | Status: DC | PRN
  Administered 2019-09-28: 3 mL via SUBCUTANEOUS

## 2019-09-28 MED ORDER — EPHEDRINE 5 MG/ML INJ: 10.0000 mg | INTRAVENOUS | Status: DC | PRN

## 2019-09-28 MED ORDER — TERBUTALINE SULFATE 1 MG/ML IJ SOLN: 0.2500 mg | Freq: Once | INTRAMUSCULAR | Status: DC | PRN

## 2019-09-28 MED ORDER — FENTANYL 2.5 MCG/ML W/ROPIVACAINE 0.15% IN NS 100 ML EPIDURAL (ARMC)
EPIDURAL | Status: DC | PRN
  Administered 2019-09-28: 12 mL/h via EPIDURAL

## 2019-09-28 MED ORDER — BUPIVACAINE HCL (PF) 0.25 % IJ SOLN
INTRAMUSCULAR | Status: DC | PRN
  Administered 2019-09-28 (×2): 4 mL via EPIDURAL

## 2019-09-28 MED ORDER — ACETAMINOPHEN 325 MG PO TABS
650.0000 mg | ORAL_TABLET | ORAL | Status: DC | PRN
  Administered 2019-09-28 – 2019-09-30 (×6): 650 mg via ORAL
  Filled 2019-09-28 (×6): qty 2

## 2019-09-28 MED ORDER — FENTANYL 2.5 MCG/ML W/ROPIVACAINE 0.15% IN NS 100 ML EPIDURAL (ARMC): 12.0000 mL/h | EPIDURAL | Status: DC

## 2019-09-28 MED ORDER — WITCH HAZEL-GLYCERIN EX PADS: 1.0000 "application " | MEDICATED_PAD | CUTANEOUS | Status: DC | PRN

## 2019-09-28 MED ORDER — PHENYLEPHRINE 40 MCG/ML (10ML) SYRINGE FOR IV PUSH (FOR BLOOD PRESSURE SUPPORT): 80.0000 ug | PREFILLED_SYRINGE | INTRAVENOUS | Status: DC | PRN

## 2019-09-28 MED ORDER — LACTATED RINGERS IV SOLN: 500.0000 mL | Freq: Once | INTRAVENOUS | Status: DC

## 2019-09-28 MED ORDER — DIPHENHYDRAMINE HCL 50 MG/ML IJ SOLN: 12.5000 mg | INTRAMUSCULAR | Status: DC | PRN

## 2019-09-28 MED ORDER — OXYCODONE HCL 5 MG PO TABS
5.0000 mg | ORAL_TABLET | ORAL | Status: DC | PRN
  Administered 2019-09-29: 5 mg via ORAL
  Filled 2019-09-28: qty 1

## 2019-09-28 MED ORDER — OXYTOCIN 10 UNIT/ML IJ SOLN
INTRAMUSCULAR | Status: AC
  Filled 2019-09-28: qty 2

## 2019-09-28 MED ORDER — IBUPROFEN 600 MG PO TABS
600.0000 mg | ORAL_TABLET | Freq: Four times a day (QID) | ORAL | Status: DC
  Administered 2019-09-28 – 2019-09-30 (×9): 600 mg via ORAL
  Filled 2019-09-28 (×9): qty 1

## 2019-09-28 MED ORDER — LIDOCAINE HCL (PF) 1 % IJ SOLN
INTRAMUSCULAR | Status: AC
  Filled 2019-09-28: qty 30

## 2019-09-28 MED ORDER — COCONUT OIL OIL
1.0000 "application " | TOPICAL_OIL | Status: DC | PRN
  Filled 2019-09-28: qty 120

## 2019-09-28 MED ORDER — AMMONIA AROMATIC IN INHA
RESPIRATORY_TRACT | Status: AC
  Filled 2019-09-28: qty 10

## 2019-09-28 MED ORDER — SIMETHICONE 80 MG PO CHEW: 80.0000 mg | CHEWABLE_TABLET | ORAL | Status: DC | PRN

## 2019-09-28 MED ORDER — BENZOCAINE-MENTHOL 20-0.5 % EX AERO: 1.0000 "application " | INHALATION_SPRAY | CUTANEOUS | Status: DC | PRN

## 2019-09-28 MED ORDER — ONDANSETRON HCL 4 MG/2ML IJ SOLN: 4.0000 mg | INTRAMUSCULAR | Status: DC | PRN

## 2019-09-28 NOTE — Discharge Summary (Signed)
Physician Obstetric Discharge Summary  Patient ID: Sarah Holland MRN: [id number] DOB/AGE: 07/04/1991 28 y.o.   Date of Admission: 09/27/2019 Date of Delivery: 09/28/2019 Date of Discharge: 09/30/2019  Admitting Diagnosis: Induction of labor at [redacted]w[redacted]d  Secondary Diagnosis: Gestational hypertension and Gestational diabetes, controlled on insulin  Mode of Delivery: normal spontaneous vaginal delivery 09/28/2019      Discharge Diagnosis: Gestational hypertension and Term intrauterine pregnancy-delivered, Gestational diabetes controlled on insulin   Intrapartum Procedures: Atificial rupture of membranes, epidural, pitocin augmentation and Cytotec induction   Post partum procedures: none  Complications: none   Brief Hospital Course  Sarah Holland is a [id number] who had a SVD following a induction of labor  on 09/28/2019;  for further details of this delivery, please refer to the delivery note.  Patient had an uncomplicated postpartum course.  By time of discharge on PPD#2, her pain was controlled on oral pain medications; she had appropriate lochia and was ambulating, voiding without difficulty and tolerating a regular diet.  She was deemed stable for discharge to home.     Labs: CBC Latest Ref Rng & Units 09/29/2019 09/28/2019 09/27/2019  WBC 4.0 - 10.5 K/uL 10.6(H) 6.3 7.0  Hemoglobin 12.0 - 15.0 g/dL 10.8(L) 11.1(L) 11.2(L)  Hematocrit 36.0 - 46.0 % 33.9(L) 35.3(L) 35.9(L)  Platelets 150 - 400 K/uL 205 224 229   B POS  Physical exam:  Blood pressure 135/83, pulse 72, temperature 97.8 F (36.6 C), temperature source Oral, resp. rate 18, height 5\' 5"  (1.651 m), weight 117 kg, last menstrual period 12/30/2018, SpO2 100 %, unknown if currently breastfeeding. General: alert and no distress Lochia: appropriate Abdomen: soft, NT Uterine Fundus: firm Extremities: No evidence of DVT  Discharge Instructions: Per After Visit Summary. Activity: Advance as tolerated. Pelvic rest for  6 weeks.  Also refer to Discharge Instructions Diet: Regular Medications: Allergies as of 09/30/2019   No Active Allergies     Medication List    STOP taking these medications   glucose blood test strip   Insulin Glargine 100 UNIT/ML Solostar Pen Commonly known as: LANTUS   insulin lispro 100 UNIT/ML KwikPen Commonly known as: HUMALOG   Insulin Pen Needle 32G X 4 MM Misc   onetouch ultrasoft lancets     TAKE these medications   PRENATAL VITAMINS PO Take 1 capsule by mouth daily.      Outpatient follow up:  Follow-up Information    Dalia Heading, CNM. Schedule an appointment as soon as possible for a visit in 6 week(s).   Specialty: Certified Nurse Midwife Why: Postpartum visit and 2 hour GTT Contact information: [place] [address] [phone]          Postpartum contraception: ? BTL vs vasectomy  Discharged Condition: good  Discharged to: home   Newborn Data: Baby boy 7#2oz Disposition:newborn nursery, phototherapy  Apgars: APGAR (1 MIN): 7   APGAR (5 MINS): 9    Baby Feeding: Breast and formula  Rexene Agent, CNM 09/30/2019 9:04 AM

## 2019-09-28 NOTE — Progress Notes (Signed)
L&D Progress Note  Admitted 09/27/2019 for IOL for gestational hypertension and GDMA2 requiring increasing levels of insulin  Today's date: 09/28/2019  S: Feeling mild contractions. Slept a little overnight.   O: BP (!) 135/96   Pulse 93   Temp 98.7 F (37.1 C) (Oral)   Resp 17   Ht 5\' 5"  (1.651 m)   Wt 117 kg   LMP 12/30/2018 Comment: neg preg test  BMI 42.93 kg/m   Patient Vitals for the past 24 hrs:  BP Temp Temp src Pulse Resp Height Weight  09/28/19 0730 (!) 135/96 - - 93 - - -  09/28/19 0715 (!) 135/96 98.7 F (37.1 C) Oral 93 17 - -  09/28/19 0655 (!) 134/91 - - 89 - - -  09/28/19 0555 120/73 - - 87 - - -  09/28/19 0455 127/85 98.7 F (37.1 C) Oral 87 - - -  09/28/19 0355 117/73 - - 89 - - -  09/28/19 0255 135/77 - - 90 - - -  09/28/19 0155 128/76 - - 94 - - -  09/27/19 2355 139/77 98.4 F (36.9 C) Oral 99 - - -  09/27/19 2242 140/87 - - 97 - - -  09/27/19 2228 (!) 157/90 - - 99 18 - -  09/27/19 2056 (!) 144/96 98.6 F (37 C) Oral (!) 102 18 5\' 5"  (1.651 m) 117 kg   BG 53 to 122. Last value 110 General: appears comfortable FHR: 135 with accelerations to 150s+, moderate variability Toco: contractions every 2-3 minutes after 2 doses of Cytotec 25 mcg  Cervix: 3cm/50-60%/-1 to -2  A/P: Progressing in early labor  Will get CBC now in case patient desires epidural  Can eat regular diet, then will augment contractions with Pitocin Gestational hypertension: Mild range to normal blood pressures  Continue to monitor blood pressures closely Continue q 4 hour CBGs for now, will increase in frequency when more actively laboring and switch back to clears Start IV insulin if needed.  Dalia Heading, CNM

## 2019-09-28 NOTE — Progress Notes (Signed)
L&D Progress Note   S: Contractions getting stronger  O: BP 117/73   Pulse 84   Temp 98.4 F (36.9 C) (Oral)   Resp 16   Ht 5\' 5"  (1.651 m)   Wt 117 kg   LMP 12/30/2018 Comment: neg preg test  BMI 42.93 kg/m   Patient Vitals for the past 24 hrs:  BP Temp Temp src Pulse Resp Height Weight  09/28/19 1501 122/79 98.4 F (36.9 C) Oral 88 18 - -  09/28/19 1402 117/73 - - 84 - - -  09/28/19 1301 120/78 - - 92 - - -  09/28/19 1100 (!) 118/58 98.5 F (36.9 C) Oral 97 16 - -  09/28/19 0730 (!) 135/96 - - 93 - - -  09/28/19 0715 (!) 135/96 98.7 F (37.1 C) Oral 93 17 - -  09/28/19 0655 (!) 134/91 - - 89 - - -  09/28/19 0555 120/73 - - 87 - - -  09/28/19 0455 127/85 98.7 F (37.1 C) Oral 87 - - -  09/28/19 0355 117/73 - - 89 - - -  09/28/19 0255 135/77 - - 90 - - -  09/28/19 0155 128/76 - - 94 - - -  09/27/19 2355 139/77 98.4 F (36.9 C) Oral 99 - - -  09/27/19 2242 140/87 - - 97 - - -  09/27/19 2228 (!) 157/90 - - 99 18 - -  09/27/19 2056 (!) 144/96 98.6 F (37 C) Oral (!) 102 18 5\' 5"  (1.651 m) 117 kg   2 hr pp CBG=93 FHR: 125 baseline with accelerations to 140s to 150, moderate variability Contractions: Having runs of 3-5 contractions then spacing, palpate moderate. Pitocin at 62miu/min  Cervix: 4/60%/-1 to -2/ LOT  A: Dysfunctional contraction pattern Mild to normal range blood pressures today FWB: Cat 1 tracing  P: AROM: moderate clear fluid Titrate Pitocin to get in adequate pattern. Monitor progress  Dalia Heading, CNM

## 2019-09-28 NOTE — Progress Notes (Signed)
Checked CBG and it was 68. Gave pt apple juice and rechecked in 15 minutes. CBG was 67. CNM notified and order given for more apple juice and can have peanut butter and crackers.

## 2019-09-28 NOTE — Anesthesia Preprocedure Evaluation (Signed)
Anesthesia Evaluation  Patient identified by MRN, date of birth, ID band Patient awake    Reviewed: Allergy & Precautions, H&P , NPO status , Patient's Chart, lab work & pertinent test results  History of Anesthesia Complications Negative for: history of anesthetic complications  Airway Mallampati: III       Dental  (+) Teeth Intact   Pulmonary former smoker,           Cardiovascular      Neuro/Psych    GI/Hepatic   Endo/Other  diabetes, Gestational  Renal/GU      Musculoskeletal   Abdominal   Peds  Hematology   Anesthesia Other Findings   Reproductive/Obstetrics (+) Pregnancy                             Anesthesia Physical Anesthesia Plan  ASA: III  Anesthesia Plan: Epidural   Post-op Pain Management:    Induction:   PONV Risk Score and Plan:   Airway Management Planned:   Additional Equipment:   Intra-op Plan:   Post-operative Plan:   Informed Consent: I have reviewed the patients History and Physical, chart, labs and discussed the procedure including the risks, benefits and alternatives for the proposed anesthesia with the patient or authorized representative who has indicated his/her understanding and acceptance.       Plan Discussed with: Anesthesiologist  Anesthesia Plan Comments:         Anesthesia Quick Evaluation

## 2019-09-28 NOTE — Anesthesia Procedure Notes (Signed)
Epidural  Start time: 09/28/2019 5:26 PM End time: 09/28/2019 5:38 PM  Staffing Anesthesiologist: Alvin Critchley, MD Resident/CRNA: Aline Brochure, CRNA Performed: resident/CRNA   Preanesthetic Checklist Completed: patient identified, site marked, surgical consent, pre-op evaluation, timeout performed, IV checked, risks and benefits discussed and monitors and equipment checked  Epidural Patient position: sitting Prep: ChloraPrep and site prepped and draped Patient monitoring: heart rate, continuous pulse ox and blood pressure Approach: midline Location: L3-L4 Injection technique: LOR air  Needle:  Needle type: Tuohy  Needle gauge: 17 G Needle length: 9 cm Needle insertion depth: 8 cm Catheter type: closed end flexible Catheter size: 19 Gauge Catheter at skin depth: 13 cm Test dose: negative and 1.5% lidocaine with Epi 1:200 K  Assessment Events: blood not aspirated, injection not painful, no injection resistance, negative IV test and no paresthesia  Additional Notes Reason for block:procedure for pain

## 2019-09-28 NOTE — Progress Notes (Signed)
Spoke to [place], CNM about CBG and she said to take a CBG 2 hours post meal which would be around 12:30.

## 2019-09-29 LAB — CBC
HCT: 33.9 % — ABNORMAL LOW (ref 36.0–46.0)
Hemoglobin: 10.8 g/dL — ABNORMAL LOW (ref 12.0–15.0)
MCH: 25.7 pg — ABNORMAL LOW (ref 26.0–34.0)
MCHC: 31.9 g/dL (ref 30.0–36.0)
MCV: 80.7 fL (ref 80.0–100.0)
Platelets: 205 10*3/uL (ref 150–400)
RBC: 4.2 MIL/uL (ref 3.87–5.11)
RDW: 14.6 % (ref 11.5–15.5)
WBC: 10.6 10*3/uL — ABNORMAL HIGH (ref 4.0–10.5)
nRBC: 0 % (ref 0.0–0.2)

## 2019-09-29 LAB — GLUCOSE, CAPILLARY
Glucose-Capillary: 109 mg/dL — ABNORMAL HIGH (ref 70–99)
Glucose-Capillary: 114 mg/dL — ABNORMAL HIGH (ref 70–99)
Glucose-Capillary: 118 mg/dL — ABNORMAL HIGH (ref 70–99)
Glucose-Capillary: 120 mg/dL — ABNORMAL HIGH (ref 70–99)
Glucose-Capillary: 143 mg/dL — ABNORMAL HIGH (ref 70–99)

## 2019-09-29 NOTE — Lactation Note (Signed)
This note was copied from a baby's chart. Lactation Consultation Note  Patient Name: Boy Mayzie Caughlin Today's Date: 09/29/2019 Reason for consult: Initial assessment  LC intern visited mom for an initial consult to inquire about mom's feeding choice since she is pumping, breastfeeding, and supplementing formula.  Mom informed the [facility] intern, that baby was given formula in special care due to low blood sugar and low temperature.  Since then, baby has had a few bottles and only one attempt at the breast.  Mom explained that her night nurse introduced the pump while baby was in special care, instructing mom to pump every 4 hours to maintain her supply.  Baby's last formula bottle was at 6:30 and last pumping session was at 800.  [facility] intern informed mom that every bottle of formula that is given reduces stimulation at the breast.  Mom was taught how milk supply is dependent on a supply and demand model and that milk removal is essential for milk production.  [facility] intern then corrected the night nurse's recommendation, encouraging mom to make attempts at the breast and then pump every 2-3 hours.  [facility] intern then reviewed typical newborn behaviors in the first 24hours: ideal output (1pee/1poo), wanting 1-2 good feeds, being sleepy from 36-24hr of age, and a brief review of clusterfeeding.  Mom was encouraged to rest when possible since baby will be clusterfeeding around 1800.  Mom has a desire to solely breastfeed.  [facility] intern plans to return at 1030 to attempt a breastfeed and get mom pumping.  Mom is very receptive to help and is in good spirits.  Maternal Data    Feeding Feeding Type: Bottle Fed - Formula Nipple Type: Slow - flow   Interventions Interventions: Breast feeding basics reviewed  Lactation Tools Discussed/Used     Consult Status Consult Status: Follow-up Date: 09/29/19 Follow-up type: [place] 09/29/2019, 10:02 AM

## 2019-09-29 NOTE — Lactation Note (Signed)
This note was copied from a baby's chart. Lactation Consultation Note  Patient Name: Sarah Holland XIPJA'S Date: 09/29/2019 Reason for consult: Follow-up assessment  LC intern attempted to latch baby on mom's left side but baby seemed uninterested.  When her breast was sandwiched, mom complained of breast tenderness.  [facility] intern set mom up on a pumping plan and wrote it out for her on the back of her feeding chart.  Mom began pumping while present.  Flange placement was adjusted but 32mm size was a good fit.  Mom did not produce anything from this session but she was told that stimulation was necessary for her supply.  [facility] intern told mom to keep pumping after discharge at least until her milk transitions so that breastmilk can be supplemented instead of formula.  Baby started cuing after mom pumped so he was brought back to the breast, on mom's right side.  With two attempts to latch, baby was able to get a deep latch using the teacup hold.  Mom was able to demonstrate this hold very well and was able to latch baby again when he popped off.  Mom was told she should feel tugging and have a round nipple when baby pops off.  She has been told to look out for pinching, pain, and a creased or lipstick shape to her nipple indicating a shallow latch.  Mom is very receptive to this information and is not concerned about her baby's feeding habits (since he seems sleepy and uninterested).    [facility] intern swaddled baby so mom could rest and regroup and plans on returning around 1330 to attempt and pump again.  Mom has been encouraged to call out if baby starts cuing beforehand.  Mom was also informed how sleepy babies become after a bath and testing so feeding baby beforehand is advised (with the help of an LC).  Maternal Data Has patient been taught Hand Expression?: Yes Does the patient have breastfeeding experience prior to this delivery?: No  Feeding Feeding Type: Breast Fed  LATCH Score Latch:  Repeated attempts needed to sustain latch, nipple held in mouth throughout feeding, stimulation needed to elicit sucking reflex.  Audible Swallowing: None  Type of Nipple: Everted at rest and after stimulation  Comfort (Breast/Nipple): Filling, red/small blisters or bruises, mild/mod discomfort(complains of sore breasts, hurts to sandwich)  Hold (Positioning): Assistance needed to correctly position infant at breast and maintain latch.  LATCH Score: 5  Interventions Interventions: Breast feeding basics reviewed;Assisted with latch;Skin to skin;Breast compression;Adjust position;Support pillows;Coconut oil;Comfort gels;DEBP  Lactation Tools Discussed/Used Pump Review: Setup, frequency, and cleaning Date initiated:: 09/29/19   Consult Status Consult Status: Follow-up Date: 09/29/19 Follow-up type: In-patient    Lavonia Drafts 09/29/2019, 11:39 AM

## 2019-09-29 NOTE — Progress Notes (Signed)
PPD#1 SVD, GDM Subjective:  Feeling well this morning. Some afterpains when pumping, good relief with PRN medication. Voiding without difficulty. Tolerating a regular diet. Ambulating well.  Objective:  Blood pressure 123/83, pulse 83, temperature 98 F (36.7 C), temperature source Oral, resp. rate 18, height 5\' 5"  (1.651 m), weight 117 kg, last menstrual period 12/30/2018, SpO2 99 %, currently breastfeeding.  General: NAD Pulmonary: no increased work of breathing Abdomen: non-distended, non-tender Uterus:  fundus firm at U; lochia appropriate Laceration: N/A Extremities: trace edema, no erythema, no tenderness  Results for orders placed or performed during the hospital encounter of 09/27/19 (from the past 72 hour(s))  Rapid HIV screen [facility] L&D dept ONLY)     Status: None   Collection Time: 09/27/19 11:22 AM  Result Value Ref Range   HIV-1 P24 Antigen - HIV24 NON REACTIVE NON REACTIVE    Comment: (NOTE) Detection of p24 may be inhibited by biotin in the sample, causing false negative results in acute infection.    HIV 1/2 Antibodies NON REACTIVE NON REACTIVE   Interpretation (HIV Ag Ab)      A non reactive test result means that HIV 1 or HIV 2 antibodies and HIV 1 p24 antigen were not detected in the specimen.    Comment: Performed at [facility], [address] [place], [place] [address]  SARS Coronavirus 2 by RT PCR (hospital order, performed in [facility]) Nasopharyngeal Nasopharyngeal Swab     Status: None   Collection Time: 09/27/19  9:05 PM   Specimen: Nasopharyngeal Swab  Result Value Ref Range   SARS Coronavirus 2 NEGATIVE NEGATIVE    Comment: (NOTE) If result is NEGATIVE SARS-CoV-2 target nucleic acids are NOT DETECTED. The SARS-CoV-2 RNA is generally detectable in upper and lower  respiratory specimens during the acute phase of infection. The lowest  concentration of SARS-CoV-2 viral copies this assay can detect is 250  copies / mL. A  negative result does not preclude SARS-CoV-2 infection  and should not be used as the sole basis for treatment or other  patient management decisions.  A negative result may occur with  improper specimen collection / handling, submission of specimen other  than nasopharyngeal swab, presence of viral mutation(s) within the  areas targeted by this assay, and inadequate number of viral copies  (<250 copies / mL). A negative result must be combined with clinical  observations, patient history, and epidemiological information. If result is POSITIVE SARS-CoV-2 target nucleic acids are DETECTED. The SARS-CoV-2 RNA is generally detectable in upper and lower  respiratory specimens dur ing the acute phase of infection.  Positive  results are indicative of active infection with SARS-CoV-2.  Clinical  correlation with patient history and other diagnostic information is  necessary to determine patient infection status.  Positive results do  not rule out bacterial infection or co-infection with other viruses. If result is PRESUMPTIVE POSTIVE SARS-CoV-2 nucleic acids MAY BE PRESENT.   A presumptive positive result was obtained on the submitted specimen  and confirmed on repeat testing.  While 2019 novel coronavirus  (SARS-CoV-2) nucleic acids may be present in the submitted sample  additional confirmatory testing may be necessary for epidemiological  and / or clinical management purposes  to differentiate between  SARS-CoV-2 and other Sarbecovirus currently known to infect humans.  If clinically indicated additional testing with an alternate test  methodology [phone]([id number]) is advised. The SARS-CoV-2 RNA is generally  detectable in upper and lower respiratory sp ecimens during the  acute  phase of infection. The expected result is Negative. Fact Sheet for Patients:  StrictlyIdeas.no Fact Sheet for Healthcare Providers: BankingDealers.co.za This test is not  yet approved or cleared by the [place] FDA and has been authorized for detection and/or diagnosis of SARS-CoV-2 by FDA under an Emergency Use Authorization (EUA).  This EUA will remain in effect (meaning this test can be used) for the duration of the COVID-19 declaration under Section 564(b)(1) of the Act, 21 U.S.C. section 360bbb-3(b)(1), unless the authorization is terminated or revoked sooner. Performed at [facility], [address] [place], [place] [address]   Glucose, capillary     Status: Abnormal   Collection Time: 09/27/19  9:31 PM  Result Value Ref Range   Glucose-Capillary 53 (L) 70 - 99 mg/dL  Glucose, capillary     Status: None   Collection Time: 09/27/19 10:06 PM  Result Value Ref Range   Glucose-Capillary 71 70 - 99 mg/dL  Glucose, capillary     Status: Abnormal   Collection Time: 09/27/19 11:33 PM  Result Value Ref Range   Glucose-Capillary 112 (H) 70 - 99 mg/dL  Glucose, capillary     Status: Abnormal   Collection Time: 09/28/19  1:41 AM  Result Value Ref Range   Glucose-Capillary 107 (H) 70 - 99 mg/dL  Glucose, capillary     Status: Abnormal   Collection Time: 09/28/19  5:41 AM  Result Value Ref Range   Glucose-Capillary 122 (H) 70 - 99 mg/dL  Glucose, capillary     Status: Abnormal   Collection Time: 09/28/19  7:07 AM  Result Value Ref Range   Glucose-Capillary 110 (H) 70 - 99 mg/dL  CBC     Status: Abnormal   Collection Time: 09/28/19  8:59 AM  Result Value Ref Range   WBC 6.3 4.0 - 10.5 K/uL   RBC 4.36 3.87 - 5.11 MIL/uL   Hemoglobin 11.1 (L) 12.0 - 15.0 g/dL   HCT 35.3 (L) 36.0 - 46.0 %   MCV 81.0 80.0 - 100.0 fL   MCH 25.5 (L) 26.0 - 34.0 pg   MCHC 31.4 30.0 - 36.0 g/dL   RDW 14.8 11.5 - 15.5 %   Platelets 224 150 - 400 K/uL   nRBC 0.0 0.0 - 0.2 %    Comment: Performed at [facility], [address] [place], [place] [address]  Glucose, capillary     Status: None   Collection Time: 09/28/19  1:07 PM  Result Value  Ref Range   Glucose-Capillary 93 70 - 99 mg/dL  Glucose, capillary     Status: Abnormal   Collection Time: 09/28/19  4:09 PM  Result Value Ref Range   Glucose-Capillary 68 (L) 70 - 99 mg/dL  Glucose, capillary     Status: Abnormal   Collection Time: 09/28/19  4:32 PM  Result Value Ref Range   Glucose-Capillary 67 (L) 70 - 99 mg/dL  Glucose, capillary     Status: None   Collection Time: 09/28/19  5:02 PM  Result Value Ref Range   Glucose-Capillary 88 70 - 99 mg/dL  Glucose, capillary     Status: Abnormal   Collection Time: 09/28/19  9:27 PM  Result Value Ref Range   Glucose-Capillary 103 (H) 70 - 99 mg/dL   Comment 1 Notify RN    Comment 2 Document in Chart   Glucose, capillary     Status: Abnormal   Collection Time: 09/29/19 12:18 AM  Result Value Ref Range  Glucose-Capillary 143 (H) 70 - 99 mg/dL   Comment 1 Notify RN    Comment 2 Document in Chart   CBC     Status: Abnormal   Collection Time: 09/29/19  5:46 AM  Result Value Ref Range   WBC 10.6 (H) 4.0 - 10.5 K/uL   RBC 4.20 3.87 - 5.11 MIL/uL   Hemoglobin 10.8 (L) 12.0 - 15.0 g/dL   HCT 17.5 (L) 10.2 - 58.5 %   MCV 80.7 80.0 - 100.0 fL   MCH 25.7 (L) 26.0 - 34.0 pg   MCHC 31.9 30.0 - 36.0 g/dL   RDW 27.7 82.4 - 23.5 %   Platelets 205 150 - 400 K/uL   nRBC 0.0 0.0 - 0.2 %    Comment: Performed at [facility], [address] [place], [place] [address]  Glucose, capillary     Status: Abnormal   Collection Time: 09/29/19  6:20 AM  Result Value Ref Range   Glucose-Capillary 109 (H) 70 - 99 mg/dL   Comment 1 Notify RN    Comment 2 Document in Chart     Assessment:   28 y.o. [id number] postpartum day #1 recovering well.  Plan:  1) Acute blood loss anemia - hemodynamically stable and asymptomatic - Continue prenatal vitamins with iron  2) Blood Type --/--/B POS (10/27 1122)   3) Rubella 3.96 (04/22 1503) / Varicella Immune / TDAP status: received antepartum on 08/02/2019  4) Breastfeeding  5)  Contraception: considering interval BTL or vasectomy  6) Disposition: continue postpartum care.  Marcelyn Bruins, CNM 09/29/2019  9:29 AM

## 2019-09-29 NOTE — Anesthesia Postprocedure Evaluation (Signed)
Anesthesia Post Note  Patient: Sarah Holland  Procedure(s) Performed: AN AD HOC LABOR EPIDURAL  Patient location during evaluation: Mother Baby Anesthesia Type: Epidural Level of consciousness: awake and alert Pain management: pain level controlled Vital Signs Assessment: post-procedure vital signs reviewed and stable Respiratory status: spontaneous breathing, nonlabored ventilation and respiratory function stable Cardiovascular status: stable Postop Assessment: no headache, no backache and epidural receding Anesthetic complications: no     Last Vitals:  Vitals:   09/29/19 0021 09/29/19 0536  BP: (!) 142/88 122/77  Pulse: (!) 106 85  Resp:    Temp:  36.8 C  SpO2: 99% 100%    Last Pain:  Vitals:   09/29/19 0730  TempSrc:   PainSc: 0-No pain                 [name] B [name]

## 2019-09-29 NOTE — Lactation Note (Signed)
This note was copied from a baby's chart. Lactation Consultation Note  Patient Name: Sarah Holland Today's Date: 09/29/2019   [facility] intern returned to help mom in a breastfeeding attempt. Baby was skin to skin with mom on entry, sleeping, no cues. Mom is proficient at 29 her breast to help latch baby but needs some assistance with positioning baby belly to belly, nose to nipple, chin to breast.  Baby remained uninterested, open hands, sleepy.    [facility] intern encouraged mom to practice hand expression to increase baby's interest and help keep him sucking at the breast.  When mom and [facility] intern practiced hand expression, mom complains of breast tenderness and sensitivity.  It takes a decent amount of effort to produce a single drop of hand expression since she is reluctant to hand express.  Mom has maintained her pumping schedule throughout the day and understands the importance of breast stimulation.  She has a pump at home but cannot recall the model.  [facility] intern reminded her to continue pumping after discharge.  [facility] intern recommends that mom continue to offer the breast, pump every 2-3 hours, and immediately supplement pumped milk to baby.  Mom should also continue skin to skin as much as she can.  Mom was told to feed baby any pumped or expressed milk with a spoon or curved tipped syringe.   Interventions    Lactation Tools Discussed/Used     Consult Status      Lavonia Drafts 09/29/2019, 4:24 PM

## 2019-09-30 ENCOUNTER — Encounter: Payer: 59 | Admitting: Certified Nurse Midwife

## 2019-09-30 LAB — GLUCOSE, CAPILLARY: Glucose-Capillary: 96 mg/dL (ref 70–99)

## 2019-09-30 NOTE — Anesthesia Postprocedure Evaluation (Signed)
Anesthesia Post Note  Patient: Damian Leavell  Procedure(s) Performed: AN AD [place]  Patient location during evaluation: Mother Baby Anesthesia Type: Epidural Level of consciousness: awake and alert and oriented Pain management: pain level controlled Vital Signs Assessment: post-procedure vital signs reviewed and stable Respiratory status: spontaneous breathing Cardiovascular status: stable Postop Assessment: patient able to bend at knees, no apparent nausea or vomiting, able to ambulate, adequate PO intake, no headache and no backache Anesthetic complications: no     Last Vitals:  Vitals:   09/29/19 1922 09/29/19 2306  BP: 126/85 (!) 142/85  Pulse: 82 78  Resp: 18 16  Temp: 36.9 C 36.7 C  SpO2: 98% 98%    Last Pain:  Vitals:   09/30/19 0551  TempSrc:   PainSc: 4                  Lanora Manis

## 2019-09-30 NOTE — Lactation Note (Signed)
This note was copied from a baby's chart. Lactation Consultation Note  Patient Name: Sarah Holland Today's Date: 09/30/2019  Baby under bili lights, mom getting ready to pump breasts, states she has been attempting at breast before giving baby formula after, has trouble latching baby, she then pumps after and gives what she gets by spoon to baby.  She states she is getting more when she pumps.  She states she has a breast pump at home, I offered to help her latch the baby to the breast at a feeding if she would like help.  She stated she would call me, [facility] name and phone no. Written on white board in the room    Maternal Data    Feeding    LATCH Score                   Interventions    Lactation Tools Discussed/Used     Consult Status      Ferol Luz 09/30/2019, 4:06 PM

## 2019-09-30 NOTE — Progress Notes (Signed)
Discharge instructions reviewed with patient and patient verbalized understanding. Patient healthy and discharged. All documentation is complete.

## 2019-09-30 NOTE — Discharge Instructions (Signed)

## 2019-12-08 ENCOUNTER — Ambulatory Visit (INDEPENDENT_AMBULATORY_CARE_PROVIDER_SITE_OTHER): Payer: 59 | Admitting: Certified Nurse Midwife

## 2019-12-08 ENCOUNTER — Other Ambulatory Visit: Payer: Self-pay

## 2019-12-08 ENCOUNTER — Ambulatory Visit (INDEPENDENT_AMBULATORY_CARE_PROVIDER_SITE_OTHER): Payer: 59

## 2019-12-08 ENCOUNTER — Encounter: Payer: Self-pay | Admitting: Certified Nurse Midwife

## 2019-12-08 VITALS — BP 140/76 | HR 80 | Ht 64.0 in | Wt 244.0 lb

## 2019-12-08 DIAGNOSIS — D62 Acute posthemorrhagic anemia: Secondary | ICD-10-CM | POA: Diagnosis not present

## 2019-12-08 DIAGNOSIS — Z30013 Encounter for initial prescription of injectable contraceptive: Secondary | ICD-10-CM

## 2019-12-08 DIAGNOSIS — O24414 Gestational diabetes mellitus in pregnancy, insulin controlled: Secondary | ICD-10-CM

## 2019-12-08 DIAGNOSIS — Z3042 Encounter for surveillance of injectable contraceptive: Secondary | ICD-10-CM | POA: Diagnosis not present

## 2019-12-08 LAB — GLUCOSE, POCT (MANUAL RESULT ENTRY): POC Glucose: 120 mg/dl — AB (ref 70–99)

## 2019-12-08 LAB — POCT HEMOGLOBIN: Hemoglobin: 12.2 g/dL (ref 11–14.6)

## 2019-12-08 MED ORDER — MEDROXYPROGESTERONE ACETATE 150 MG/ML IM SUSP
150.0000 mg | Freq: Once | INTRAMUSCULAR | Status: AC
  Administered 2019-12-08: 16:00:00 150 mg via INTRAMUSCULAR

## 2019-12-08 MED ORDER — MEDROXYPROGESTERONE ACETATE 150 MG/ML IM SUSP: 150.0000 mg | INTRAMUSCULAR | 3 refills | Status: DC

## 2019-12-08 NOTE — Progress Notes (Signed)
Patient presents today for Depo Provera injection after delivery 09/28/2019. LMP 12/01/2019. Given IM RUOQ. Patient tolerated well.

## 2019-12-08 NOTE — Progress Notes (Signed)
Postpartum Visit  Chief Complaint:  Chief Complaint  Patient presents with  . Postpartum Care    History of Present Illness: Sarah Holland is a 29 y.o. BF [id number] who presents for a postpartum visit. She is now 10 weeks postpartum.  Date of delivery: 09/28/2019 Type of delivery: Vaginal delivery - Vacuum or forceps assisted  no Episiotomy No.  Laceration: no  Pregnancy or labor problems:  Yes, Insulin requiring GDM not well controlled, morbid obesity, gestational hypertension Any problems since the delivery:  Yes, has had unprotected sex prior to her LMP 31 Dec to 6 January. Has returned to work and is exhausted. 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 She sleeps when baby naps. Would like to try the Bonding with Baby program at work. Will have unpaid leave.   Newborn Details:  SINGLETON :  1. 25 name: Sarah Holland    Birth weight: 7#2oz Maternal Details:  Breast Feeding:  no Post partum depression/anxiety noted:  no Edinburgh Post-Partum Depression Score:   Date of last PAP: 03/23/19  normal   Review of Systems: ROS-see HPI  Past Medical History:  Past Medical History:  Diagnosis Date  . Gestational diabetes   . Obesity (BMI 30-39.9)     Past Surgical History:  Past Surgical History:  Procedure Laterality Date  . APPENDECTOMY      Family History:  Family History  Problem Relation Age of Onset  . Diabetes Maternal Grandmother     Social History:  Social History   Socioeconomic History  . Marital status: Married    Spouse name: Not on file  . Number of children: 3  . Years of education: Not on file  . Highest education level: Not on file  Occupational History  . Not on file  Tobacco Use  . Smoking status: Former Smoker    Packs/day: 0.25    Years: 3.00    Pack years: 0.75    Types: Cigarettes    Quit date: 12/01/2017    Years since quitting: 2.0  . Smokeless tobacco: Never Used  Substance  and Sexual Activity  . Alcohol use: No  . Drug use: Not on file  . Sexual activity: Yes    Birth control/protection: None  Other Topics Concern  . Not on file  Social History Narrative  . Not on file   Social Determinants of Health   Financial Resource Strain:   . Difficulty of Paying Living Expenses: Not on file  Food Insecurity:   . Worried About Charity fundraiser in the Last Year: Not on file  . Ran Out of Food in the Last Year: Not on file  Transportation Needs:   . Lack of Transportation (Medical): Not on file  . Lack of Transportation (Non-Medical): Not on file  Physical Activity:   . Days of Exercise per Week: Not on file  . Minutes of Exercise per Session: Not on file  Stress:   . Feeling of Stress : Not on file  Social Connections:   . Frequency of Communication with Friends and Family: Not on file  . Frequency of Social Gatherings with Friends and Family: Not on file  . Attends Religious Services: Not on file  . Active Member of Clubs or Organizations: Not on file  . Attends Archivist Meetings: Not on file  . Marital Status: Not on file  Intimate Partner Violence:   .  Fear of Current or Ex-Partner: Not on file  . Emotionally Abused: Not on file  . Physically Abused: Not on file  . Sexually Abused: Not on file    Allergies:  No Active Allergies  Medications: Prior to Admission medications   Medication Sig Start Date End Date Taking? Authorizing Provider  Prenatal Vit-Fe Fumarate-FA (PRENATAL VITAMINS PO) Take 1 capsule by mouth daily.   Yes [provider]  medroxyPROGESTERone (DEPO-PROVERA) 150 MG/ML injection Inject 1 mL (150 mg total) into the muscle every 3 (three) months. 12/08/19   Farrel Conners, CNM    Physical Exam Vitals: BP 140/76 (BP Location: Right Arm, Patient Position: Sitting, Cuff Size: Large)   Pulse 80   Ht 5\' 4"  (1.626 m)   Wt 244 lb (110.7 kg)   LMP 12/01/2019   Breastfeeding No   BMI 41.88 kg/m  General:  NAD HEENT: normocephalic, anicteric Neck: No thyroid enlargement, no palpable nodules, no cervical lymphadenpathy. Acanthosis nigricans is present Breast: soft, no inflammation, no masses, nipples intact Pulmonary: No increased work of breathing, CTAB Heart: RRR without murmur Abdomen: Soft, non-tender, non-distended.  Umbilicus without lesions.  No hepatomegaly or masses palpable. No evidence of hernia. Genitourinary:  External: Well healed perineum, no lesions or inflammation    Vagina: Normal vaginal mucosa, no evidence of prolapse.    Cervix: Grossly normal in appearance, no bleeding  Uterus: RF, Well involuted, mobile, non-tender  Adnexa: No adnexal masses, non-tender  Rectal: deferred Extremities: no edema, erythema, or tenderness Neurologic: Grossly intact Psychiatric: mood appropriate, affect full  Assessment: 29 y.o. 26 presenting for  postpartum visit  History of GDM requiring insulin Hx of gestational hypertension-with mild range blood pressure today   Plan:  1) Contraception: Patient would like to use Depo Provera for contraception. RX sent to pharmacy. To return to office for the injection today. Use condoms x 2 weeks. (has used in past and is aware of side effects).Needs injection every 12 weeks.  2)  Pap not due  3) Letter written for work recommending Bonding with Baby Program  4) Discussed return to normal activity, recommend continuing prenatal vitamins.  5) 2 hour GTT ordered. Will recheck blood pressure when she returns for this lab. Also needs [id number], CNM

## 2019-12-09 ENCOUNTER — Ambulatory Visit: Payer: 59 | Admitting: Certified Nurse Midwife

## 2019-12-13 ENCOUNTER — Other Ambulatory Visit: Payer: Self-pay

## 2019-12-13 ENCOUNTER — Ambulatory Visit (INDEPENDENT_AMBULATORY_CARE_PROVIDER_SITE_OTHER): Payer: 59 | Admitting: Certified Nurse Midwife

## 2019-12-13 ENCOUNTER — Encounter: Payer: Self-pay | Admitting: Certified Nurse Midwife

## 2019-12-13 ENCOUNTER — Other Ambulatory Visit: Payer: 59

## 2019-12-13 VITALS — BP 134/80 | HR 84 | Ht 64.0 in | Wt 246.0 lb

## 2019-12-13 DIAGNOSIS — O24414 Gestational diabetes mellitus in pregnancy, insulin controlled: Secondary | ICD-10-CM

## 2019-12-13 DIAGNOSIS — R030 Elevated blood-pressure reading, without diagnosis of hypertension: Secondary | ICD-10-CM

## 2019-12-13 DIAGNOSIS — Z1332 Encounter for screening for maternal depression: Secondary | ICD-10-CM | POA: Diagnosis not present

## 2019-12-13 NOTE — Progress Notes (Signed)
  History of Present Illness:  Sarah Holland is a 29 y.o. who was seen last week for a postpartum visit and her blood pressure was 140/76. She presents for a blood pressure check and also I have requested that she do an depression screen with an [place]. Had gestational hypertension and GDMA2 requiring insulin with her recent pregnancy. Is also having a 2 hour GTT today.    PMHx: She  has a past medical history of Gestational diabetes and Obesity (BMI 30-39.9). Also,  has a past surgical history that includes Appendectomy., family history includes Diabetes in her maternal grandmother.,  reports that she quit smoking about 2 years ago. Her smoking use included cigarettes. She has a 0.75 pack-year smoking history. She has never used smokeless tobacco. She reports that she does not drink alcohol or use drugs.  She has a current medication list which includes the following prescription(s): medroxyprogesterone and prenatal vit-fe fumarate-fa. Also, has No Known Allergies.  ROS  Physical Exam:  BP 134/80   Pulse 84   Ht 5\' 4"  (1.626 m)   Wt 246 lb (111.6 kg)   LMP 12/01/2019   BMI 42.23 kg/m  Body mass index is 42.23 kg/m. Constitutional: Well nourished, well developed female in no acute distress.  Abdomen: diffusely non tender to palpation, non distended, and no masses, hernias Neuro: Grossly intact Psych:  Normal mood and affect.    Edinburgh score: 5  Assessment: Blood pressure OK No concern for postpartum depression  Plan: Will call with results of her 2 hour GTT RTO in 12 weeks for Depo and prn    12/03/2019, CNM [facility], [facility] 12/13/2019  11:36 AM

## 2019-12-14 LAB — GLUCOSE TOLERANCE, 2 HOURS W/ 1HR
Glucose, 1 hour: 101 mg/dL (ref 65–179)
Glucose, 2 hour: 113 mg/dL (ref 65–152)
Glucose, Fasting: 114 mg/dL — ABNORMAL HIGH (ref 65–91)

## 2019-12-15 LAB — GLUCOSE TOLERANCE, 2 HOURS

## 2019-12-16 LAB — SPECIMEN STATUS REPORT

## 2019-12-16 LAB — GLUCOSE TOLERANCE, 2 HOURS
Glucose, 2 hour: 110 mg/dL (ref 65–139)
Glucose, GTT - Fasting: 112 mg/dL — ABNORMAL HIGH (ref 65–99)

## 2020-03-01 ENCOUNTER — Ambulatory Visit (INDEPENDENT_AMBULATORY_CARE_PROVIDER_SITE_OTHER): Payer: 59

## 2020-03-01 ENCOUNTER — Other Ambulatory Visit: Payer: Self-pay

## 2020-03-01 DIAGNOSIS — Z3042 Encounter for surveillance of injectable contraceptive: Secondary | ICD-10-CM | POA: Diagnosis not present

## 2020-03-01 MED ORDER — MEDROXYPROGESTERONE ACETATE 150 MG/ML IM SUSP
150.0000 mg | Freq: Once | INTRAMUSCULAR | Status: AC
  Administered 2020-03-01: 150 mg via INTRAMUSCULAR

## 2020-06-07 ENCOUNTER — Ambulatory Visit (INDEPENDENT_AMBULATORY_CARE_PROVIDER_SITE_OTHER): Payer: 59

## 2020-06-07 ENCOUNTER — Other Ambulatory Visit: Payer: Self-pay

## 2020-06-07 DIAGNOSIS — Z3042 Encounter for surveillance of injectable contraceptive: Secondary | ICD-10-CM

## 2020-06-07 MED ORDER — MEDROXYPROGESTERONE ACETATE 150 MG/ML IM SUSP
150.0000 mg | Freq: Once | INTRAMUSCULAR | Status: AC
  Administered 2020-06-07: 150 mg via INTRAMUSCULAR

## 2020-06-07 NOTE — Progress Notes (Signed)
Patient presents today for Depo Provera injection outside of date range. Patient not currently on menses. Pregnancy test performed per protocol (negative).Given IM RUOQ. Patient tolerated well. 

## 2020-08-30 DIAGNOSIS — E669 Obesity, unspecified: Secondary | ICD-10-CM | POA: Insufficient documentation

## 2020-09-06 ENCOUNTER — Ambulatory Visit (INDEPENDENT_AMBULATORY_CARE_PROVIDER_SITE_OTHER): Payer: 59

## 2020-09-06 ENCOUNTER — Other Ambulatory Visit: Payer: Self-pay

## 2020-09-06 DIAGNOSIS — Z3042 Encounter for surveillance of injectable contraceptive: Secondary | ICD-10-CM | POA: Diagnosis not present

## 2020-09-06 MED ORDER — MEDROXYPROGESTERONE ACETATE 150 MG/ML IM SUSP
150.0000 mg | Freq: Once | INTRAMUSCULAR | Status: AC
  Administered 2020-09-06: 150 mg via INTRAMUSCULAR

## 2020-09-06 NOTE — Progress Notes (Signed)
Pt here for depo which was given IM right glut.  NDC# [phone] 

## 2020-11-22 ENCOUNTER — Other Ambulatory Visit: Payer: Self-pay

## 2020-11-22 MED ORDER — MEDROXYPROGESTERONE ACETATE 150 MG/ML IM SUSP: 150.0000 mg | INTRAMUSCULAR | 0 refills | Status: DC

## 2020-11-29 ENCOUNTER — Ambulatory Visit: Payer: 59

## 2021-03-07 ENCOUNTER — Ambulatory Visit (INDEPENDENT_AMBULATORY_CARE_PROVIDER_SITE_OTHER): Payer: 59 | Admitting: Obstetrics and Gynecology

## 2021-03-07 ENCOUNTER — Encounter: Payer: Self-pay | Admitting: Obstetrics and Gynecology

## 2021-03-07 ENCOUNTER — Other Ambulatory Visit (HOSPITAL_COMMUNITY)
Admission: RE | Admit: 2021-03-07 | Discharge: 2021-03-07 | Disposition: A | Discharge status: Home / Self Care | Payer: 59 | Source: Ambulatory Visit | Attending: Obstetrics and Gynecology | Admitting: Obstetrics and Gynecology

## 2021-03-07 ENCOUNTER — Ambulatory Visit (INDEPENDENT_AMBULATORY_CARE_PROVIDER_SITE_OTHER): Payer: 59

## 2021-03-07 ENCOUNTER — Other Ambulatory Visit: Payer: Self-pay

## 2021-03-07 VITALS — BP 138/74 | HR 92 | Ht 66.0 in | Wt 211.0 lb

## 2021-03-07 DIAGNOSIS — Z3042 Encounter for surveillance of injectable contraceptive: Secondary | ICD-10-CM

## 2021-03-07 DIAGNOSIS — Z01419 Encounter for gynecological examination (general) (routine) without abnormal findings: Secondary | ICD-10-CM

## 2021-03-07 DIAGNOSIS — Z7185 Encounter for immunization safety counseling: Secondary | ICD-10-CM | POA: Diagnosis not present

## 2021-03-07 DIAGNOSIS — Z124 Encounter for screening for malignant neoplasm of cervix: Secondary | ICD-10-CM

## 2021-03-07 DIAGNOSIS — Z0000 Encounter for general adult medical examination without abnormal findings: Secondary | ICD-10-CM | POA: Diagnosis not present

## 2021-03-07 LAB — POCT URINE PREGNANCY: Preg Test, Ur: NEGATIVE

## 2021-03-07 MED ORDER — MEDROXYPROGESTERONE ACETATE 150 MG/ML IM SUSP
150.0000 mg | Freq: Once | INTRAMUSCULAR | Status: AC
Start: 2021-03-07 — End: 2021-03-07
  Administered 2021-03-07: 150 mg via INTRAMUSCULAR

## 2021-03-07 MED ORDER — MEDROXYPROGESTERONE ACETATE 150 MG/ML IM SUSP: 150.0000 mg | INTRAMUSCULAR | 3 refills | Status: DC

## 2021-03-07 NOTE — Progress Notes (Signed)
Pt here for depo which was given IM left glut after obtaining a negative urine preg test.  NDC# [phone]

## 2021-03-07 NOTE — Progress Notes (Signed)
Gynecology Annual Exam   PCP: Patient, No Pcp Per (Inactive)  Chief Complaint:  Chief Complaint  Patient presents with  . Gynecologic Exam    Annual - want to start back on depo. RM 5    History of Present Illness: Patient is a 30 y.o. [id number] presents for annual exam. The patient has no complaints today. She desires to restart her depo injections.  LMP: No LMP recorded. Patient has had an injection. Average Interval: None- patient has not cycled since last depo injection in 12/21 but does not some light pink spotting with wiping. Duration of flow: absent Heavy Menses: no Clots: no Intermenstrual Bleeding: no Postcoital Bleeding: no Dysmenorrhea: no  The patient is sexually active. She currently uses nothing for contraception, will restart depo today. She denies dyspareunia. There is no notable family history of breast or ovarian cancer in her family. Patient recently underwent abdominal surgery ("tummy tuck with lipo in two places" per patient). She still has a JP drain in place and is wearing an abdominal binder today.  The patient wears seatbelts: yes.   The patient has regular exercise: no.    The patient denies current symptoms of depression.    PQH-9: 0 GAD-7: 0  Review of Systems: Review of Systems  Constitutional: Negative.   HENT: Negative.   Eyes: Negative.   Respiratory: Negative.   Cardiovascular: Negative.   Gastrointestinal: Negative.   Genitourinary: Negative.   Musculoskeletal: Negative.   Skin: Negative.   Neurological: Negative.   Endo/Heme/Allergies: Negative.   Psychiatric/Behavioral: Negative.     Past Medical History:  Patient Active Problem List   Diagnosis Date Noted  . Gestational hypertension 09/28/2019  . Normal vaginal delivery 09/28/2019  . History of gestational diabetes 08/02/2019  . Obesity (BMI 30-39.9)   . SIRS (systemic inflammatory response syndrome) (HCC) 05/10/2015    Past Surgical History:  Past Surgical History:   Procedure Laterality Date  . APPENDECTOMY      Gynecologic History:  No LMP recorded. Patient has had an injection. Contraception: none Last Pap: Results were: NILM - 2020   Obstetric History: [id number]  Family History:  Family History  Problem Relation Age of Onset  . Diabetes Maternal Grandmother   . Cancer Maternal Grandmother   . Cancer Father     Social History:  Social History   Socioeconomic History  . Marital status: Married    Spouse name: Not on file  . Number of children: 3  . Years of education: Not on file  . Highest education level: Not on file  Occupational History  . Not on file  Tobacco Use  . Smoking status: Former Smoker    Packs/day: 0.25    Years: 3.00    Pack years: 0.75    Types: Cigarettes    Quit date: 12/01/2017    Years since quitting: 3.2  . Smokeless tobacco: Never Used  Vaping Use  . Vaping Use: Never used  Substance and Sexual Activity  . Alcohol use: No  . Drug use: Never  . Sexual activity: Yes    Birth control/protection: None  Other Topics Concern  . Not on file  Social History Narrative  . Not on file   Social Determinants of Health   Financial Resource Strain: Not on file  Food Insecurity: Not on file  Transportation Needs: Not on file  Physical Activity: Not on file  Stress: Not on file  Social Connections: Not on file  Intimate Partner Violence: Not on  file    Allergies:  No Known Allergies  Medications: Prior to Admission medications   Medication Sig Start Date End Date Taking? Authorizing Provider  medroxyPROGESTERone (DEPO-PROVERA) 150 MG/ML injection Inject 1 mL (150 mg total) into the muscle every 3 (three) months. 03/07/21  Yes Marysa Wessner, CNM  medroxyPROGESTERone (DEPO-PROVERA) 150 MG/ML injection Inject 1 mL (150 mg total) into the muscle every 3 (three) months. 11/22/20   Mirna Mires, CNM  Prenatal Vit-Fe Fumarate-FA (PRENATAL VITAMINS PO) Take 1 capsule by mouth daily. Patient not taking:  Reported on 03/07/2021    [provider]    Physical Exam Vitals: Blood pressure 138/74, pulse 92, height 5\' 6"  (1.676 m), weight 211 lb (95.7 kg), not currently breastfeeding.  General: NAD HEENT: normocephalic, anicteric Thyroid: no enlargement, no palpable nodules Pulmonary: No increased work of breathing, CTAB Cardiovascular: RRR, distal pulses 2+ Breast: Breast symmetrical, no tenderness, no palpable nodules or masses, no skin or nipple retraction present, no nipple discharge.  No axillary or supraclavicular lymphadenopathy. Abdomen: NABS, soft, non-tender, non-distended.  Umbilicus without lesions.  No hepatomegaly, splenomegaly or masses palpable. No evidence of hernia  Genitourinary:  External: Normal external female genitalia.  Normal urethral meatus, normal Bartholin's and Skene's glands.    Vagina: Normal vaginal mucosa, no evidence of prolapse.    Cervix: Grossly normal in appearance, no bleeding  Uterus: Non-enlarged, mobile, normal contour.  No CMT  Adnexa: ovaries non-enlarged, no adnexal masses  Rectal: deferred  Lymphatic: no evidence of inguinal lymphadenopathy Extremities: no edema, erythema, or tenderness Neurologic: Grossly intact Psychiatric: mood appropriate, affect full  Female chaperone present for pelvic and breast  portions of the physical exam    Assessment: 30 y.o. 38 routine annual exam  Plan: Problem List Items Addressed This Visit   None   Visit Diagnoses    Encounter for surveillance of injectable contraceptive    -  Primary   Relevant Medications   medroxyPROGESTERone (DEPO-PROVERA) 150 MG/ML injection   Screening for cervical cancer       Relevant Orders   Cytology - PAP      2) STI screening  wasoffered and declined  2)  ASCCP guidelines and rational discussed.  Patient opts for yearly screening interval  3) Contraception - the patient is currently using  none.  She is interested in starting Contraception: Depo  again.  4) Routine healthcare maintenance including cholesterol, diabetes screening discussed managed by PCP - last seen in 08/2020  5) Return for Depo injections - next year for annual visit.   08/2020, CNM, MSN [facility], [facility] 03/07/2021, 11:05 AM

## 2021-03-12 LAB — CYTOLOGY - PAP
Comment: NEGATIVE
Diagnosis: HIGH — AB
High risk HPV: NEGATIVE

## 2021-03-12 NOTE — Progress Notes (Signed)
Spoke with patient via phone. Utilized two patient identifiers. Reviewed pap smear results with patient and recommendation for colposcopy. All questions answered. Message sent to scheduling to get patient scheduled for colpo with Dr. Jerene Pitch.

## 2021-04-01 ENCOUNTER — Encounter: Payer: Self-pay | Admitting: Obstetrics and Gynecology

## 2021-04-01 ENCOUNTER — Ambulatory Visit (INDEPENDENT_AMBULATORY_CARE_PROVIDER_SITE_OTHER): Payer: 59 | Admitting: Obstetrics and Gynecology

## 2021-04-01 ENCOUNTER — Other Ambulatory Visit (HOSPITAL_COMMUNITY)
Admission: RE | Admit: 2021-04-01 | Discharge: 2021-04-01 | Disposition: A | Discharge status: Home / Self Care | Payer: 59 | Source: Ambulatory Visit | Attending: Obstetrics and Gynecology | Admitting: Obstetrics and Gynecology

## 2021-04-01 ENCOUNTER — Other Ambulatory Visit: Payer: Self-pay

## 2021-04-01 VITALS — BP 122/76 | Ht 65.0 in | Wt 207.0 lb

## 2021-04-01 DIAGNOSIS — R87611 Atypical squamous cells cannot exclude high grade squamous intraepithelial lesion on cytologic smear of cervix (ASC-H): Secondary | ICD-10-CM | POA: Insufficient documentation

## 2021-04-01 NOTE — Patient Instructions (Signed)

## 2021-04-01 NOTE — Progress Notes (Signed)
   GYNECOLOGY CLINIC COLPOSCOPY PROCEDURE NOTE  30 y.o. [id number] here for colposcopy for ASC cannot exclude high grade lesion [facility])  pap smear on 03/07/2021. Discussed underlying role for HPV infection in the development of cervical dysplasia, its natural history and progression/regression, need for surveillance.  Is the patient  pregnant: No LMP: No LMP recorded. Patient has had an injection. Smoking status:  reports that she quit smoking about 3 years ago. Her smoking use included cigarettes. She has a 0.75 pack-year smoking history. She has never used smokeless tobacco. Contraception: Depo-Provera injections Future fertility desired:  Yes  Patient given informed consent, signed copy in the chart, time out was performed.  The patient was position in dorsal lithotomy position. Speculum was placed the cervix was visualized.   After application of acetic acid colposcopic inspection of the cervix was undertaken.   Colposcopy adequate, full visualization of transformation zone: Yes mosaicism noted at 12,5,7 o'clock; corresponding biopsies obtained.   ECC specimen obtained:  Yes  All specimens were labeled and sent to pathology.   Patient was given post procedure instructions.  Will follow up pathology and manage accordingly.  Routine preventative health maintenance measures emphasized.  Physical Exam Genitourinary:        Adelene Idler MD [facility], [facility] 04/01/2021 3:54 PM

## 2021-04-04 LAB — SURGICAL PATHOLOGY

## 2021-05-30 ENCOUNTER — Other Ambulatory Visit: Payer: Self-pay

## 2021-05-30 ENCOUNTER — Ambulatory Visit (INDEPENDENT_AMBULATORY_CARE_PROVIDER_SITE_OTHER): Payer: 59

## 2021-05-30 DIAGNOSIS — Z3042 Encounter for surveillance of injectable contraceptive: Secondary | ICD-10-CM

## 2021-05-30 MED ORDER — MEDROXYPROGESTERONE ACETATE 150 MG/ML IM SUSP
150.0000 mg | Freq: Once | INTRAMUSCULAR | Status: AC
  Administered 2021-05-30: 150 mg via INTRAMUSCULAR

## 2021-05-30 NOTE — Progress Notes (Signed)
Pt here for depo which was given IM right glut.  NDC# [phone] 

## 2021-08-21 ENCOUNTER — Other Ambulatory Visit: Payer: Self-pay

## 2021-08-21 ENCOUNTER — Telehealth: Payer: Self-pay

## 2021-08-21 NOTE — Telephone Encounter (Signed)
Pt calling; requested refill of depo in MyChart and it was denied; appt for inj is tomorrow.  [phone]  Adv pt should have refills on file at the pharm thru 03/2022.

## 2021-08-22 ENCOUNTER — Ambulatory Visit (INDEPENDENT_AMBULATORY_CARE_PROVIDER_SITE_OTHER): Payer: 59

## 2021-08-22 ENCOUNTER — Other Ambulatory Visit: Payer: Self-pay

## 2021-08-22 DIAGNOSIS — Z3042 Encounter for surveillance of injectable contraceptive: Secondary | ICD-10-CM

## 2021-08-22 MED ORDER — MEDROXYPROGESTERONE ACETATE 150 MG/ML IM SUSP
150.0000 mg | Freq: Once | INTRAMUSCULAR | Status: AC
  Administered 2021-08-22: 150 mg via INTRAMUSCULAR

## 2021-08-22 NOTE — Progress Notes (Signed)
Pt here for depo which was given IM right dorsoglut. Pt's skin was tougher than I thought or I didn't have enough power behind the stick; had to push extra to get the needle to go in.  Pt stated she didn't even feel it; tolerated inj well.  NDC# [phone]

## 2021-09-18 ENCOUNTER — Other Ambulatory Visit: Payer: Self-pay | Admitting: Internal Medicine

## 2021-09-18 ENCOUNTER — Other Ambulatory Visit: Payer: Self-pay

## 2021-09-18 ENCOUNTER — Ambulatory Visit
Admission: RE | Admit: 2021-09-18 | Discharge: 2021-09-18 | Disposition: A | Discharge status: Home / Self Care | Payer: 59 | Source: Ambulatory Visit | Attending: Internal Medicine | Admitting: Internal Medicine

## 2021-09-18 DIAGNOSIS — R1903 Right lower quadrant abdominal swelling, mass and lump: Secondary | ICD-10-CM | POA: Insufficient documentation

## 2021-09-18 MED ORDER — IOHEXOL 300 MG/ML  SOLN
100.0000 mL | Freq: Once | INTRAMUSCULAR | Status: AC | PRN
  Administered 2021-09-18: 100 mL via INTRAVENOUS

## 2021-11-14 ENCOUNTER — Ambulatory Visit (INDEPENDENT_AMBULATORY_CARE_PROVIDER_SITE_OTHER): Payer: 59

## 2021-11-14 ENCOUNTER — Other Ambulatory Visit: Payer: Self-pay

## 2021-11-14 DIAGNOSIS — Z30013 Encounter for initial prescription of injectable contraceptive: Secondary | ICD-10-CM

## 2021-11-14 MED ORDER — MEDROXYPROGESTERONE ACETATE 150 MG/ML IM SUSP
150.0000 mg | Freq: Once | INTRAMUSCULAR | Status: AC
  Administered 2021-11-14: 150 mg via INTRAMUSCULAR

## 2022-01-30 ENCOUNTER — Ambulatory Visit (INDEPENDENT_AMBULATORY_CARE_PROVIDER_SITE_OTHER): Payer: 59

## 2022-01-30 ENCOUNTER — Other Ambulatory Visit: Payer: Self-pay

## 2022-01-30 DIAGNOSIS — Z30013 Encounter for initial prescription of injectable contraceptive: Secondary | ICD-10-CM

## 2022-01-30 MED ORDER — MEDROXYPROGESTERONE ACETATE 150 MG/ML IM SUSP
150.0000 mg | Freq: Once | INTRAMUSCULAR | Status: AC
  Administered 2022-01-30: 150 mg via INTRAMUSCULAR

## 2022-01-30 NOTE — Progress Notes (Signed)
Pt here for depo which was given IM left glut.  Pt tolerated well.  NDC# [id number] °

## 2022-03-05 IMAGING — CT CT ABD-PELV W/ CM
2 of 4 series · 16 of 46 positions shown, 18 images · IV contrast (APPLIED)
Comparison: June 27, 2014.

CLINICAL DATA: Acute right lower quadrant abdominal pain.

EXAM:
CT ABDOMEN AND PELVIS WITH CONTRAST
TECHNIQUE: Multidetector CT imaging of the abdomen and pelvis was performed
using the standard protocol following bolus administration of
intravenous contrast.
CONTRAST:  100mL OMNIPAQUE IOHEXOL 300 MG/ML  SOLN

[Series 2: routine abd/pel with · axial · 0.92mm/px · z∈[-588,-158]mm · 13 of 96 slices shown, 15 images]
[im 5/96  soft-tissue]
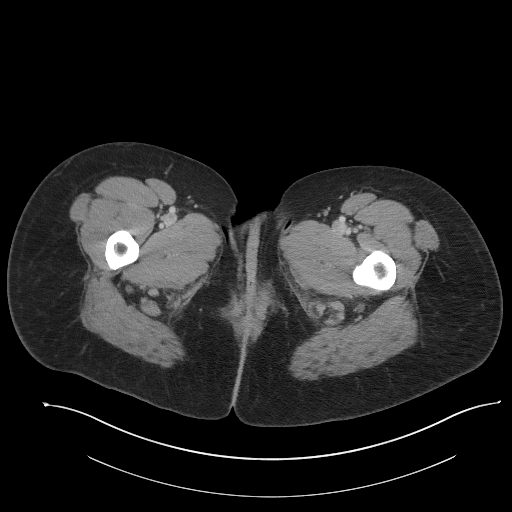
[im 5/96  bone]
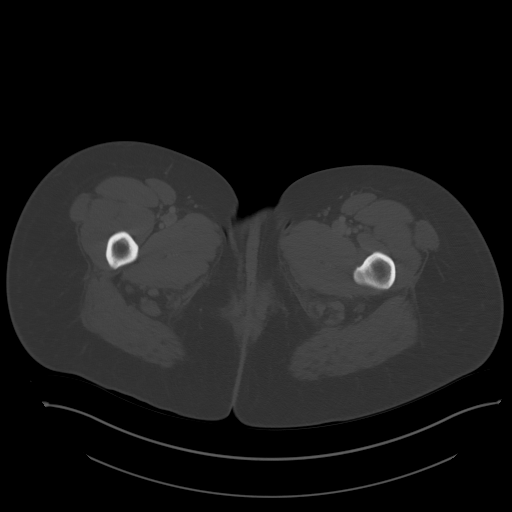
[im 13/96  soft-tissue]
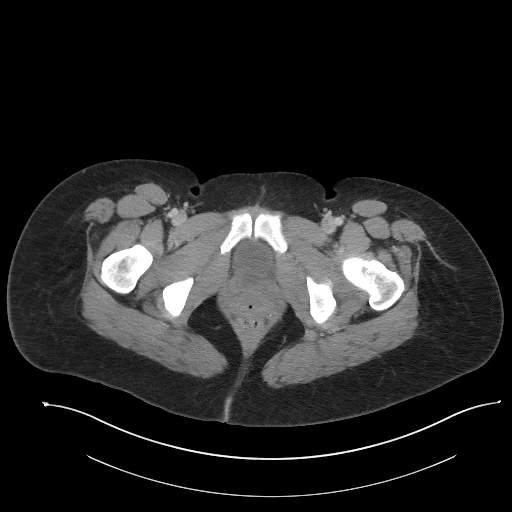
[im 21/96  soft-tissue]
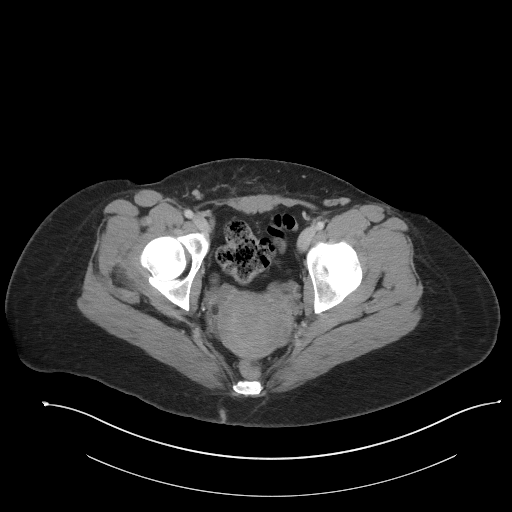
[im 25/96  soft-tissue]
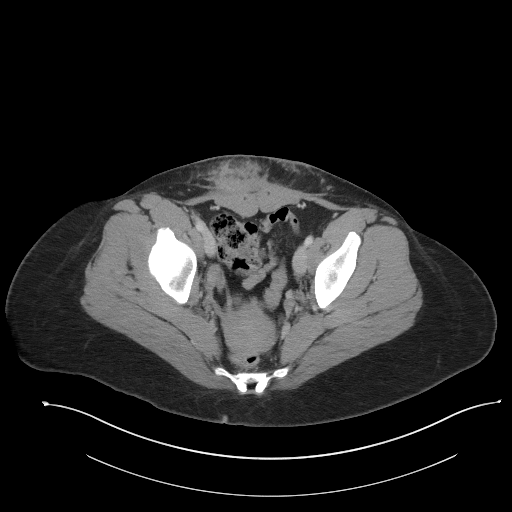
[im 34/96  soft-tissue]
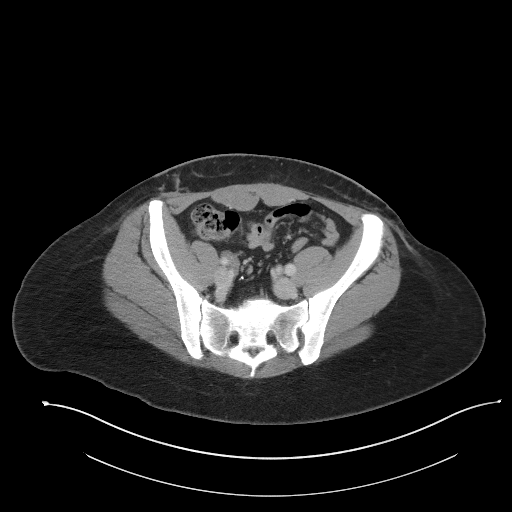
[im 42/96  soft-tissue]
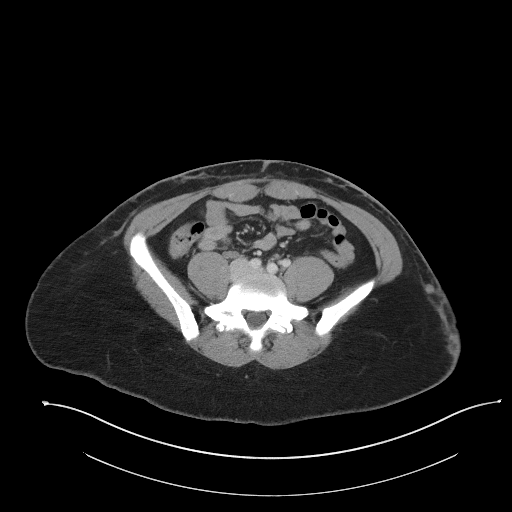
[im 50/96  soft-tissue]
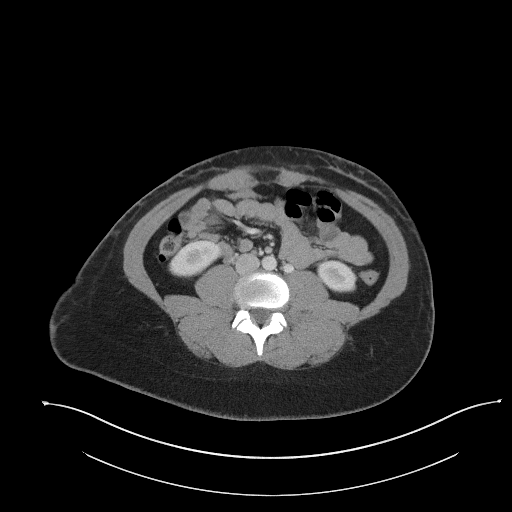
[im 54/96  soft-tissue]
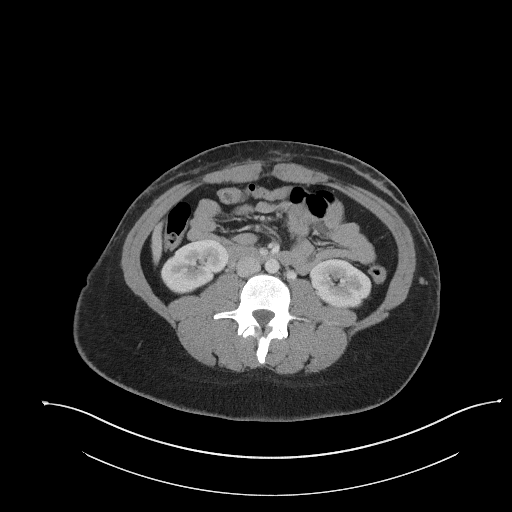
[im 62/96  soft-tissue]
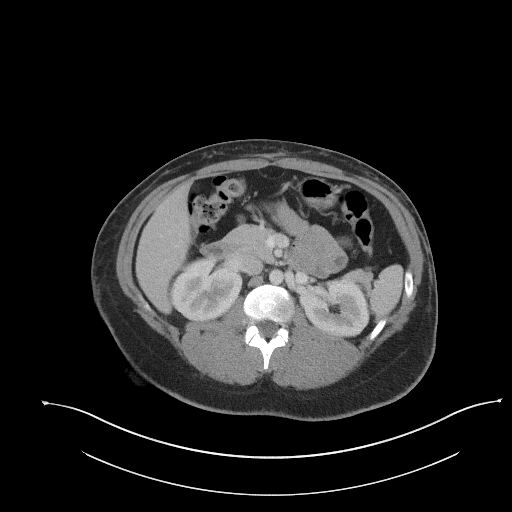
[im 62/96  bone]
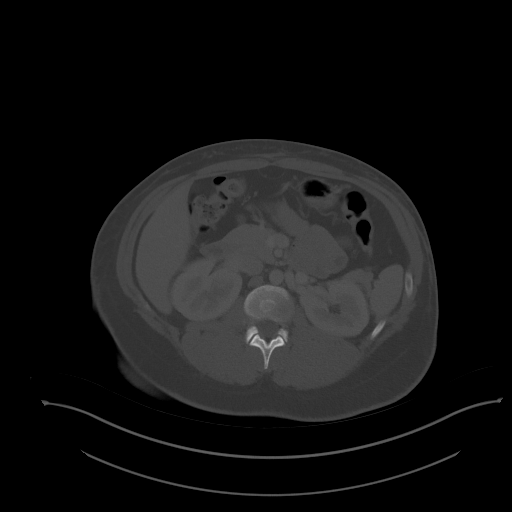
[im 71/96  soft-tissue]
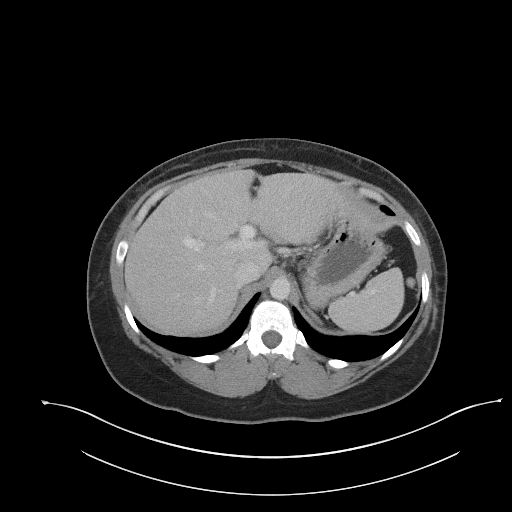
[im 75/96  soft-tissue]
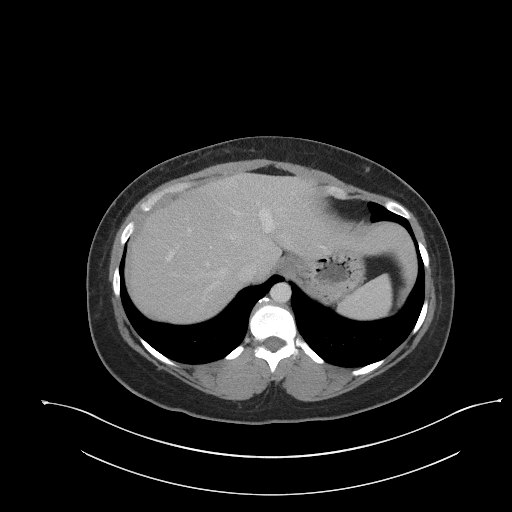
[im 83/96  soft-tissue]
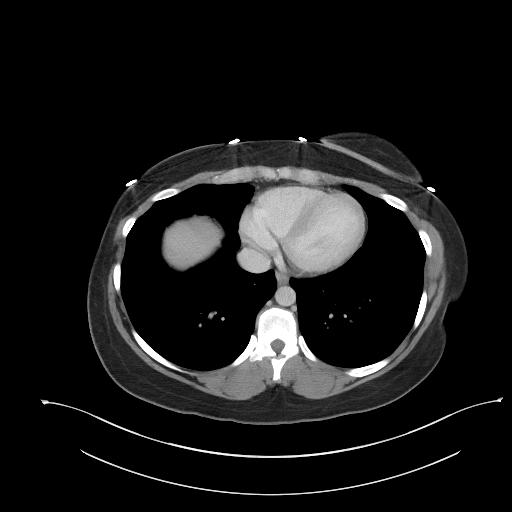
[im 91/96  soft-tissue]
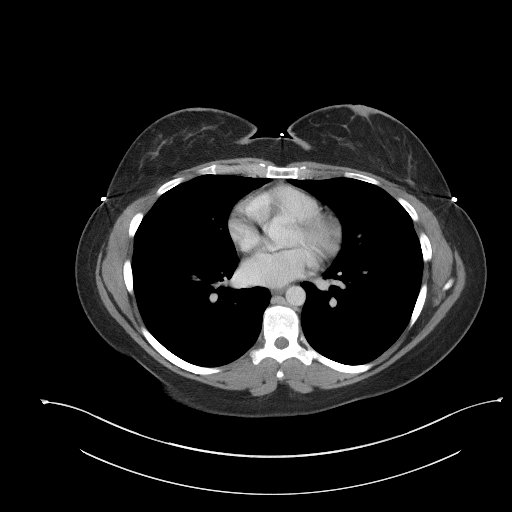

[Series 5: coronal st · coronal · 0.91mm/px · 3 of 103 slices shown]
[im 35/103  soft-tissue]
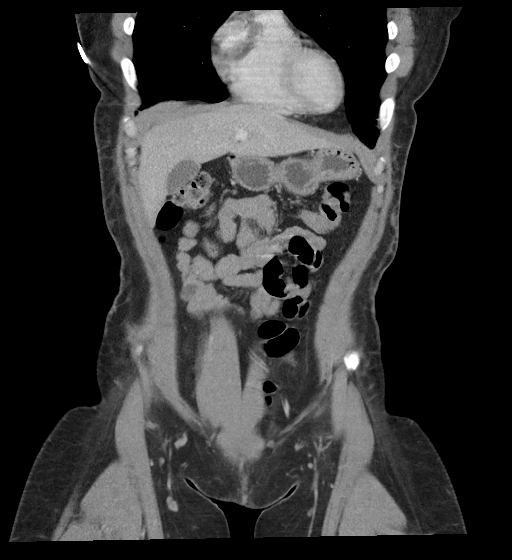
[im 46/103  soft-tissue]
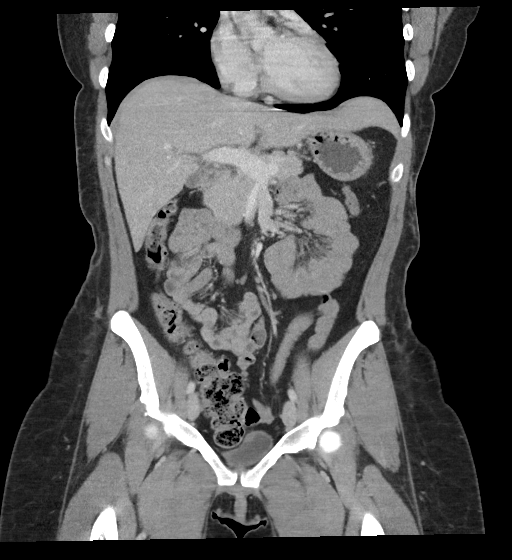
[im 57/103  soft-tissue]
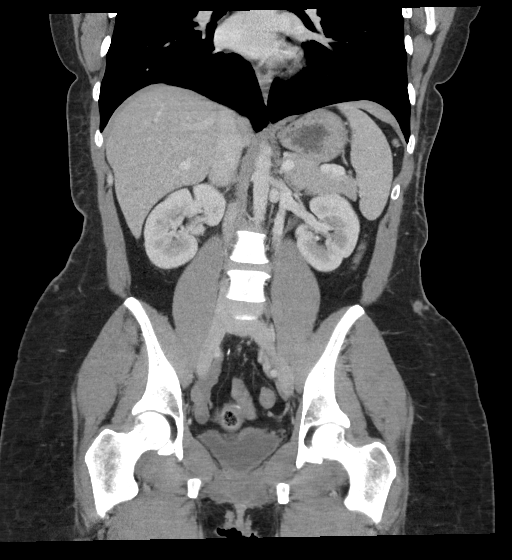

[16 of 46 positions shown; findings below may reference images not displayed]

FINDINGS: Lower chest: No acute abnormality.

Hepatobiliary: No focal liver abnormality is seen. No gallstones,
gallbladder wall thickening, or biliary dilatation.

Pancreas: Unremarkable. No pancreatic ductal dilatation or
surrounding inflammatory changes.

Spleen: Normal in size without focal abnormality.

Adrenals/Urinary Tract: Adrenal glands are unremarkable. Kidneys are
normal, without renal calculi, focal lesion, or hydronephrosis.
Bladder is unremarkable.

Stomach/Bowel: The stomach appears normal. There is no evidence of
bowel obstruction or inflammation. Status post appendectomy.

Vascular/Lymphatic: No significant vascular findings are present. No
enlarged abdominal or pelvic lymph nodes.

Reproductive: Uterus and bilateral adnexa are unremarkable.

Other: No ascites is noted. Probable postsurgical change are seen
involving the anterior subcutaneous tissues of the pelvis. [DATE] x
cm fluid collection is seen in this area most consistent with
postoperative seroma, although abscess cannot be excluded.

Musculoskeletal: No acute or significant osseous findings.
IMPRESSION: Irregular densities are seen involving the subcutaneous tissues of
the anterior wall of the pelvis most consistent with postoperative
change. 2.7 x 1.1 cm fluid collection is seen in this area which
most likely represents postoperative seroma, although abscess cannot
be excluded.

## 2022-04-29 ENCOUNTER — Other Ambulatory Visit: Payer: Self-pay

## 2022-04-29 DIAGNOSIS — Z3042 Encounter for surveillance of injectable contraceptive: Secondary | ICD-10-CM

## 2022-04-29 MED ORDER — MEDROXYPROGESTERONE ACETATE 150 MG/ML IM SUSP: 150.0000 mg | INTRAMUSCULAR | 0 refills | Status: DC

## 2022-04-29 NOTE — Telephone Encounter (Signed)
Pt calling; has inj 05/01/22; needs refill of depo; has scheduled annual.  [phone]  Pt aware refill eRx'd.

## 2022-05-01 ENCOUNTER — Ambulatory Visit: Payer: 59

## 2022-05-01 ENCOUNTER — Ambulatory Visit (INDEPENDENT_AMBULATORY_CARE_PROVIDER_SITE_OTHER): Payer: 59 | Admitting: Obstetrics

## 2022-05-01 ENCOUNTER — Encounter: Payer: Self-pay | Admitting: Obstetrics

## 2022-05-01 ENCOUNTER — Other Ambulatory Visit (HOSPITAL_COMMUNITY)
Admission: RE | Admit: 2022-05-01 | Discharge: 2022-05-01 | Disposition: A | Discharge status: Home / Self Care | Payer: 59 | Source: Ambulatory Visit | Attending: Obstetrics | Admitting: Obstetrics

## 2022-05-01 VITALS — BP 122/80 | Ht 65.0 in | Wt 213.6 lb

## 2022-05-01 DIAGNOSIS — N870 Mild cervical dysplasia: Secondary | ICD-10-CM

## 2022-05-01 DIAGNOSIS — Z124 Encounter for screening for malignant neoplasm of cervix: Secondary | ICD-10-CM | POA: Diagnosis not present

## 2022-05-01 DIAGNOSIS — Z1151 Encounter for screening for human papillomavirus (HPV): Secondary | ICD-10-CM

## 2022-05-01 DIAGNOSIS — L910 Hypertrophic scar: Secondary | ICD-10-CM | POA: Diagnosis not present

## 2022-05-01 DIAGNOSIS — Z3042 Encounter for surveillance of injectable contraceptive: Secondary | ICD-10-CM | POA: Diagnosis not present

## 2022-05-01 DIAGNOSIS — L089 Local infection of the skin and subcutaneous tissue, unspecified: Secondary | ICD-10-CM

## 2022-05-01 DIAGNOSIS — Z0000 Encounter for general adult medical examination without abnormal findings: Secondary | ICD-10-CM

## 2022-05-01 DIAGNOSIS — Z01411 Encounter for gynecological examination (general) (routine) with abnormal findings: Secondary | ICD-10-CM | POA: Diagnosis not present

## 2022-05-01 DIAGNOSIS — Z6835 Body mass index (BMI) 35.0-35.9, adult: Secondary | ICD-10-CM

## 2022-05-01 DIAGNOSIS — R7303 Prediabetes: Secondary | ICD-10-CM

## 2022-05-01 LAB — POCT URINALYSIS DIPSTICK
Bilirubin, UA: NEGATIVE
Blood, UA: NEGATIVE
Glucose, UA: NEGATIVE
Ketones, UA: NEGATIVE
Leukocytes, UA: NEGATIVE
Nitrite, UA: NEGATIVE
Protein, UA: NEGATIVE
Spec Grav, UA: >=1.03 — AB (ref 1.010–1.025)
Urobilinogen, UA: 0.2 E.U./dL
pH, UA: 6 (ref 5.0–8.0)

## 2022-05-01 MED ORDER — MEDROXYPROGESTERONE ACETATE 150 MG/ML IM SUSP
150.0000 mg | Freq: Once | INTRAMUSCULAR | Status: DC
  Administered 2022-05-01: 150 mg via INTRAMUSCULAR

## 2022-05-01 MED ORDER — MEDROXYPROGESTERONE ACETATE 150 MG/ML IM SUSP: 150.0000 mg | INTRAMUSCULAR | 3 refills | Status: DC

## 2022-05-01 NOTE — Progress Notes (Signed)
Chief Complaint  Patient presents with   Annual Exam   Patient Sarah Holland is an 31 y.o. year old [phone] No LMP recorded. Patient has had an injection. currently depo for contraception who presents for annual. Patient is exercising regularly. Patient does perform self breast exam. Patient reports family history of genetic cancer - dad with pancreatic cancer. Patient takes a multivitamin. Patient is content with the birth control method. Patient is sexually active with no problems.   Patients pronouns are she her   Primary care provider: Enid Baas, MD  Review of Systems  Constitutional:  Negative for activity change, appetite change, chills, diaphoresis, fatigue, fever and unexpected weight change.  HENT:  Negative for congestion, dental problem, drooling, ear discharge, ear pain, facial swelling, hearing loss, mouth sores, nosebleeds, postnasal drip, rhinorrhea, sinus pressure, sinus pain, sneezing, sore throat, tinnitus, trouble swallowing and voice change.   Eyes:  Negative for photophobia, pain, discharge, redness, itching and visual disturbance.  Respiratory:  Negative for apnea, cough, choking, chest tightness, shortness of breath, wheezing and stridor.   Cardiovascular:  Negative for chest pain, palpitations and leg swelling.  Gastrointestinal:  Negative for abdominal distention, abdominal pain, anal bleeding, blood in stool, constipation, diarrhea, nausea, rectal pain and vomiting.  Endocrine: Negative for cold intolerance, heat intolerance, polydipsia, polyphagia and polyuria.  Genitourinary:  Negative for decreased urine volume, difficulty urinating, dyspareunia, dysuria, enuresis, flank pain, frequency, genital sores, hematuria, menstrual problem, pelvic pain, urgency, vaginal bleeding, vaginal discharge and vaginal pain.  Musculoskeletal:  Negative for arthralgias, back pain, gait problem, joint swelling, myalgias, neck pain and neck stiffness.  Skin:  Negative for color  change, pallor, rash and wound.  Allergic/Immunologic: Negative for environmental allergies, food allergies and immunocompromised state.  Neurological:  Positive for headaches. Negative for dizziness, tremors, seizures, syncope, facial asymmetry, speech difficulty, weakness, light-headedness and numbness.  Hematological:  Negative for adenopathy. Does not bruise/bleed easily.  Psychiatric/Behavioral:  Negative for agitation, behavioral problems, confusion, decreased concentration, dysphoric mood, hallucinations, self-injury, sleep disturbance and suicidal ideas. The patient is not nervous/anxious and is not hyperactive.     Menstrual history: Menarche: 10 Amenorrhea on depo - has been on many years 2 yrs since last child  Past Medical History:  Diagnosis Date   Elevated blood pressure complicating pregnancy in third trimester, antepartum 09/27/2019   Encounter for induction of labor 09/28/2019   Gestational diabetes    Gestational hypertension 09/28/2019   Obesity (BMI 30-39.9)    Obesity complicating peripregnancy, antepartum 08/09/2019   Supervision of high risk pregnancy, antepartum 04/05/2019   Clinic [facility] Prenatal Labs Dating LMP Blood type: B/Positive/-- (04/22 1503)  Genetic Screen NIPS: Negative XY Antibody:Negative (04/22 1503) Anatomic [place] Complete 05/16/2019 Rubella: 3.96 (04/22 1503) Varicella: Immune GTT Early:               Third trimester: 204; 3 hr 98/219/204/140- RPR: Non Reactive (04/22 1503)  Rhogam N/A HBsAg: Negative (04/22 1503)  TDaP vaccine        08/02/19               Past Surgical History:  Procedure Laterality Date   APPENDECTOMY     Family History  Problem Relation Age of Onset   Cancer Father        pancreatic   Diabetes Maternal Grandmother    Cancer Maternal Grandmother    Social History   Socioeconomic History   Marital status: Married    Spouse name: Not on file   Number  of children: 3   Years of education: Not on file   Highest education level:  Not on file  Occupational History   Not on file  Tobacco Use   Smoking status: Former    Packs/day: 0.25    Years: 3.00    Pack years: 0.75    Types: Cigarettes    Quit date: 12/01/2017    Years since quitting: 4.4   Smokeless tobacco: Never  Vaping Use   Vaping Use: Never used  Substance and Sexual Activity   Alcohol use: No   Drug use: Never   Sexual activity: Yes    Birth control/protection: Injection  Other Topics Concern   Not on file  Social History Narrative   Not on file   Social Determinants of Health   Financial Resource Strain: Not on file  Food Insecurity: Not on file  Transportation Needs: Not on file  Physical Activity: Not on file  Stress: Not on file  Social Connections: Not on file  Intimate Partner Violence: Not on file   Patient reports history abnormal paps.  ASC H s/p colpo CIN 1 Patient denies pelvic infections. Patient denies domestic violence or sexual abuse Patient has not received Gardisil series  Health Maintenance  Topic Date Due   COVID-19 Vaccine (1) Never done   Hepatitis C Screening  Never done   INFLUENZA VACCINE  07/01/2022   PAP SMEAR-Modifier  03/07/2024   TETANUS/TDAP  08/01/2029   HIV Screening  Completed   HPV VACCINES  Aged Out   Medicine list and allergies reviewed and updated.     Objective:  BP 122/80   Ht 5\' 5"  (1.651 m)   Wt 213 lb 9.6 oz (96.9 kg)   BMI 35.54 kg/m     05/01/2022   10:07 AM 03/07/2021    9:22 AM 07/25/2019    9:51 AM  Depression screen PHQ 2/9  Decreased Interest 0 0 0  Down, Depressed, Hopeless 0 0 0  PHQ - 2 Score 0 0 0  Altered sleeping 0 0   Tired, decreased energy 0 0   Change in appetite 1 0   Feeling bad or failure about yourself  1 0   Trouble concentrating 0 0   Moving slowly or fidgety/restless 0 0   Suicidal thoughts 0 0   PHQ-9 Score 2 0   Difficult doing work/chores Somewhat difficult Not difficult at all       05/01/2022   10:07 AM 03/07/2021    9:22 AM  GAD 7 : Generalized  Anxiety Score  Nervous, Anxious, on Edge 0 0  Control/stop worrying 0 0  Worry too much - different things 0 0  Trouble relaxing 0 0  Restless  0  Easily annoyed or irritable 1 0  Afraid - awful might happen 0 0  Total GAD 7 Score  0  Anxiety Difficulty Not difficult at all Not difficult at all    Physical Exam Vitals and nursing note reviewed. Exam conducted with a chaperone present.  Constitutional:      General: She is not in acute distress.    Appearance: Normal appearance. She is not ill-appearing.  HENT:     Head: Normocephalic and atraumatic.     Mouth/Throat:     Mouth: Mucous membranes are moist.     Pharynx: No oropharyngeal exudate.  Eyes:     Extraocular Movements: Extraocular movements intact.  Neck:     Thyroid: No thyromegaly.  Cardiovascular:     Rate  and Rhythm: Normal rate and regular rhythm.     Pulses: Normal pulses.     Heart sounds: Normal heart sounds.  Pulmonary:     Effort: Pulmonary effort is normal. No respiratory distress.     Breath sounds: Normal breath sounds.  Chest:     Chest wall: No mass or tenderness.  Breasts:    Breasts are symmetrical.     Right: Normal. No mass, nipple discharge, skin change or tenderness.     Left: Normal. No mass, nipple discharge, skin change or tenderness.  Abdominal:     General: There is no distension.     Palpations: There is no mass.     Tenderness: There is no abdominal tenderness. There is no rebound.     Hernia: No hernia is present.    Genitourinary:    General: Normal vulva.     Exam position: Lithotomy position.     Pubic Area: No rash.      Labia:        Right: No rash, tenderness or lesion.        Left: No rash, tenderness or lesion.      Urethra: No urethral lesion.     Vagina: Normal. No foreign body. No vaginal discharge, tenderness or lesions.     Cervix: No discharge, friability, lesion, erythema or cervical bleeding.     Uterus: Normal. Not enlarged, not tender and no uterine  prolapse.      Adnexa: Right adnexa normal and left adnexa normal.       Right: No tenderness or fullness.         Left: No tenderness or fullness.    Musculoskeletal:        General: No tenderness. Normal range of motion.     Cervical back: Normal range of motion. No tenderness.     Right lower leg: No edema.     Left lower leg: No edema.  Lymphadenopathy:     Cervical: No cervical adenopathy.     Upper Body:     Right upper body: No axillary adenopathy.     Left upper body: No axillary adenopathy.     Lower Body: No right inguinal adenopathy. No left inguinal adenopathy.  Skin:    General: Skin is warm and dry.  Neurological:     General: No focal deficit present.     Mental Status: She is alert and oriented to person, place, and time. Mental status is at baseline.     Coordination: Coordination normal.     Gait: Gait normal.     Deep Tendon Reflexes: Reflexes normal.  Psychiatric:        Mood and Affect: Mood normal.        Behavior: Behavior normal.        Thought Content: Thought content normal.        Judgment: Judgment normal.    Assessment/Plan:   Encounter for surveillance of injectable contraceptive - Plan: medroxyPROGESTERone (DEPO-PROVERA) 150 MG/ML injection, DISCONTINUED: medroxyPROGESTERone (DEPO-PROVERA) injection 150 mg  Annual physical exam - Plan: POCT urinalysis dipstick  Encounter for gynecological examination with abnormal finding  Screening for malignant neoplasm of cervix - Plan: Cytology - PAP  Screening for human papillomavirus (HPV) - Plan: Cytology - PAP  Prediabetes  Wound infection - Plan: Wound culture  Keloid scar  Dysplasia of cervix, low grade (CIN 1)  Pap HPV updated, history of abn pap CIN 1 on colpo Monthly self breast exam. Daily multivitamin recommended. Contraception: depo  today, discussed black box warning as below Gardisil discussed declined History of GDM and current prediabetes. Wellness labs Per PCP  Exercise  discussed with patient.  Currently on amox for wound drainage/infection. Has follow up scheduled. Culture obtained. Recommend mederma for keloid.  Depot medroxyprogesterone acetate (DMPA) is a highly effective injectable contraceptive that affords privacy (similar to an intrauterine system) and has a convenient dose schedule of four times per year, making it appealing to many users, especially adolescents. Although the use of DMPA is associated with loss of BMD, evidence suggests that losses in BMD appear to be substantially or fully reversible. No high-quality data answer the important clinical question of whether DMPA affects fracture risk in adolescents or adults later in life. The potential health risks associated with the bone effects of DMPA must be balanced against a woman's likelihood of pregnancy using other methods or no method, and the known negative health and social consequences associated with unintended pregnancy, particularly among adolescents. Health care providers should inform women and adolescents considering initiating DMPA or continuing the method about the benefits and the risks of DMPA and should discuss the FDA "black box" warning. Concerns regarding the effect of DMPA on BMD and potential fracture risk should not prevent practitioners from prescribing DMPA or continuing use beyond 2 years. Routine DXA for BMD monitoring is not recommended in adolescents and young women using DMPA because DXA has not been validated in these populations. Adolescents should be counseled about other contraceptive methods and offered the option of initiating or transitioning to long-acting reversible contraceptive methods that have no effect on BMD, such as intrauterine devices and contraceptive implants, as alternatives to long-term DMPA use.  Return in about 1 year (around 05/02/2023) for 1 year annual and 3 month depo injection.

## 2022-05-01 NOTE — Patient Instructions (Signed)
Have a great year! Please call with any concerns. Don't forget to wear your seatbelt everyday! If you are not signed up on MyChart, please ask [place] how to sign up for it!   In a world where you can be anything, please be kind.   Body mass index is 35.54 kg/m.  A Healthy Lifestyle: Care Instructions Your Care Instructions  A healthy lifestyle can help you feel good, stay at a healthy weight, and have plenty of energy for both work and play. A healthy lifestyle is something you can share with your whole family. A healthy lifestyle also can lower your risk for serious health problems, such as high blood pressure, heart disease, and diabetes. You can follow a few steps listed below to improve your health and the health of your family. Follow-up care is a key part of your treatment and safety. Be sure to make and go to all appointments, and call your doctor if you are having problems. It's also a good idea to know your test results and keep a list of the medicines you take. How can you care for yourself at home? Do not eat too much sugar, fat, or fast foods. You can still have dessert and treats now and then. The goal is moderation. Start small to improve your eating habits. Pay attention to portion sizes, drink less juice and soda pop, and eat more fruits and vegetables. Eat a healthy amount of food. A 3-ounce serving of meat, for example, is about the size of a deck of cards. Fill the rest of your plate with vegetables and whole grains. Limit the amount of soda and sports drinks you have every day. Drink more water when you are thirsty. Eat at least 5 servings of fruits and vegetables every day. It may seem like a lot, but it is not hard to reach this goal. A serving or helping is 1 piece of fruit, 1 cup of vegetables, or 2 cups of leafy, raw vegetables. Have an apple or some carrot sticks as an afternoon snack instead of a candy bar. Try to have fruits and/or vegetables at every meal. Make exercise  part of your daily routine. You may want to start with simple activities, such as walking, bicycling, or slow swimming. Try to be active 30 to 60 minutes every day. You do not need to do all 30 to 60 minutes all at once. For example, you can exercise 3 times a day for 10 or 20 minutes. Moderate exercise is safe for most people, but it is always a good idea to talk to your doctor before starting an exercise program. Keep moving. Mow the lawn, work in the garden, or [employer]. Take the stairs instead of the elevator at work. If you smoke, quit. People who smoke have an increased risk for heart attack, stroke, cancer, and other lung illnesses. Quitting is hard, but there are ways to boost your chance of quitting tobacco for good. Use nicotine gum, patches, or lozenges. Ask your doctor about stop-smoking programs and medicines. Keep trying. In addition to reducing your risk of diseases in the future, you will notice some benefits soon after you stop using tobacco. If you have shortness of breath or asthma symptoms, they will likely get better within a few weeks after you quit. Limit how much alcohol you drink. Moderate amounts of alcohol (up to 2 drinks a day for men, 1 drink a day for women) are okay. But drinking too much can lead to  liver problems, high blood pressure, and other health problems. Family health If you have a family, there are many things you can do together to improve your health. Eat meals together as a family as often as possible. Eat healthy foods. This includes fruits, vegetables, lean meats and dairy, and whole grains. Include your family in your fitness plan. Most people think of activities such as jogging or tennis as the way to fitness, but there are many ways you and your family can be more active. Anything that makes you breathe hard and gets your heart pumping is exercise. Here are some tips: Walk to do errands or to take your child to school or the bus. Go for a family  bike ride after dinner instead of watching TV. Care instructions adapted under license by your healthcare professional. This care instruction is for use with your licensed healthcare professional. If you have questions about a medical condition or this instruction, always ask your healthcare professional. [place] any warranty or liability for your use of this information.

## 2022-05-05 LAB — WOUND CULTURE: Organism ID, Bacteria: NONE SEEN

## 2022-05-06 LAB — CYTOLOGY - PAP
Comment: NEGATIVE
Diagnosis: NEGATIVE
Diagnosis: REACTIVE
High risk HPV: NEGATIVE

## 2022-05-07 NOTE — Progress Notes (Signed)
Pt aware and states she has an appointment with the surgeon on Tuesday.

## 2022-07-21 ENCOUNTER — Ambulatory Visit (INDEPENDENT_AMBULATORY_CARE_PROVIDER_SITE_OTHER): Payer: 59

## 2022-07-21 VITALS — BP 112/80 | Ht 66.0 in | Wt 206.0 lb

## 2022-07-21 DIAGNOSIS — Z3042 Encounter for surveillance of injectable contraceptive: Secondary | ICD-10-CM | POA: Diagnosis not present

## 2022-07-21 MED ORDER — MEDROXYPROGESTERONE ACETATE 150 MG/ML IM SUSP
150.0000 mg | Freq: Once | INTRAMUSCULAR | Status: AC
  Administered 2022-07-21: 150 mg via INTRAMUSCULAR

## 2022-07-21 NOTE — Progress Notes (Signed)
Date last pap: 05/01/2022. Last Depo-Provera: 05/01/2022. Side Effects if any: None. Serum HCG indicated? N/A. Depo-Provera 150 mg IM given by: Donnetta Hail, CMA. Next appointment due Nov 6-Nov 20.

## 2022-07-31 ENCOUNTER — Encounter: Payer: Self-pay | Admitting: Obstetrics and Gynecology

## 2022-10-15 ENCOUNTER — Ambulatory Visit (INDEPENDENT_AMBULATORY_CARE_PROVIDER_SITE_OTHER): Payer: 59

## 2022-10-15 ENCOUNTER — Ambulatory Visit: Payer: 59

## 2022-10-15 VITALS — BP 132/88 | HR 94 | Ht 66.0 in | Wt 208.0 lb

## 2022-10-15 DIAGNOSIS — Z3042 Encounter for surveillance of injectable contraceptive: Secondary | ICD-10-CM

## 2022-10-15 MED ORDER — MEDROXYPROGESTERONE ACETATE 150 MG/ML IM SUSP
150.0000 mg | Freq: Once | INTRAMUSCULAR | Status: AC
  Administered 2022-10-15: 150 mg via INTRAMUSCULAR

## 2022-10-15 NOTE — Progress Notes (Signed)
    NURSE VISIT NOTE  Subjective:    Patient ID: Sarah Holland, female    DOB: 1991-10-01, 31 y.o.   MRN: [id number]  HPI  Patient is a 31 y.o. [phone] female who presents for   The following portions of the patient's history were reviewed and updated as appropriate: allergies, current medications, past family history, past medical history, past social history, past surgical history, and problem list.  Review of Systems Pertinent items are noted in HPI.   Objective:   There were no vitals taken for this visit. There is no height or weight on file to calculate BMI. General appearance: alert, cooperative, and no distress  Date last pap: 05/01/2022. Last Depo-Provera: 07/21/2022. Side Effects if any: None. Serum HCG indicated? N/A. Depo-Provera 150 mg IM given by: Rocco Serene, LPN. Site: Right Upper Outer Quadrant.  Assessment:   1. Encounter for surveillance of injectable contraceptive      Plan:   Next appointment due 12/31/22 - 01/14/23.

## 2022-12-22 ENCOUNTER — Other Ambulatory Visit: Payer: Self-pay | Admitting: Internal Medicine

## 2022-12-22 DIAGNOSIS — M7581 Other shoulder lesions, right shoulder: Secondary | ICD-10-CM

## 2023-01-01 ENCOUNTER — Ambulatory Visit
Admission: RE | Admit: 2023-01-01 | Discharge: 2023-01-01 | Disposition: A | Discharge status: Home / Self Care | Payer: 59 | Source: Ambulatory Visit | Attending: Internal Medicine | Admitting: Internal Medicine

## 2023-01-01 DIAGNOSIS — M7581 Other shoulder lesions, right shoulder: Secondary | ICD-10-CM | POA: Diagnosis present

## 2023-01-07 ENCOUNTER — Ambulatory Visit (INDEPENDENT_AMBULATORY_CARE_PROVIDER_SITE_OTHER): Payer: 59

## 2023-01-07 VITALS — Ht 65.0 in | Wt 213.0 lb

## 2023-01-07 DIAGNOSIS — Z3042 Encounter for surveillance of injectable contraceptive: Secondary | ICD-10-CM

## 2023-01-07 MED ORDER — MEDROXYPROGESTERONE ACETATE 150 MG/ML IM SUSP
150.0000 mg | Freq: Once | INTRAMUSCULAR | Status: AC
  Administered 2023-01-07: 150 mg via INTRAMUSCULAR

## 2023-01-07 NOTE — Progress Notes (Signed)
    NURSE VISIT NOTE  Subjective:    Patient ID: Sarah Holland, female    DOB: 1991-06-18, 32 y.o.   MRN: [id number]  HPI  Patient is a 32 y.o. [id number] female who presents for depo provera injection.   Objective:    Ht 5\' 5"  (1.651 m)   Wt 213 lb (96.6 kg)   BMI 35.45 kg/m   Last Annual: 05/01/2022. Last pap: 05/01/2022. Last Depo-Provera: 10/15/2022. Side Effects if any: none. Serum HCG indicated? No . Depo-Provera 150 mg IM given by: Drenda Freeze, CMA. Site: Left Upper Outer Quandrant  Lab Review  @THIS  VISIT ONLY@  Assessment:   1. Encounter for surveillance of injectable contraceptive      Plan:   Next appointment due between April 25 and May 9.    Drenda Freeze, CMA

## 2023-03-23 ENCOUNTER — Emergency Department: Payer: 59

## 2023-03-23 ENCOUNTER — Encounter: Payer: Self-pay | Admitting: Emergency Medicine

## 2023-03-23 ENCOUNTER — Inpatient Hospital Stay
Admission: EM | Admit: 2023-03-23 | Discharge: 2023-03-25 | DRG: 872 | Disposition: A | Discharge status: Home / Self Care | Payer: 59 | Attending: Hospitalist | Admitting: Hospitalist

## 2023-03-23 ENCOUNTER — Other Ambulatory Visit: Payer: Self-pay

## 2023-03-23 DIAGNOSIS — N179 Acute kidney failure, unspecified: Secondary | ICD-10-CM | POA: Diagnosis not present

## 2023-03-23 DIAGNOSIS — R6520 Severe sepsis without septic shock: Secondary | ICD-10-CM | POA: Diagnosis present

## 2023-03-23 DIAGNOSIS — Z8632 Personal history of gestational diabetes: Secondary | ICD-10-CM

## 2023-03-23 DIAGNOSIS — Z833 Family history of diabetes mellitus: Secondary | ICD-10-CM

## 2023-03-23 DIAGNOSIS — E669 Obesity, unspecified: Secondary | ICD-10-CM | POA: Diagnosis present

## 2023-03-23 DIAGNOSIS — A4159 Other Gram-negative sepsis: Secondary | ICD-10-CM | POA: Diagnosis not present

## 2023-03-23 DIAGNOSIS — M5441 Lumbago with sciatica, right side: Secondary | ICD-10-CM | POA: Diagnosis not present

## 2023-03-23 DIAGNOSIS — Z800 Family history of malignant neoplasm of digestive organs: Secondary | ICD-10-CM

## 2023-03-23 DIAGNOSIS — M5440 Lumbago with sciatica, unspecified side: Secondary | ICD-10-CM | POA: Diagnosis present

## 2023-03-23 DIAGNOSIS — N10 Acute tubulo-interstitial nephritis: Secondary | ICD-10-CM | POA: Diagnosis not present

## 2023-03-23 DIAGNOSIS — Z6833 Body mass index (BMI) 33.0-33.9, adult: Secondary | ICD-10-CM

## 2023-03-23 DIAGNOSIS — E860 Dehydration: Secondary | ICD-10-CM

## 2023-03-23 DIAGNOSIS — R42 Dizziness and giddiness: Secondary | ICD-10-CM

## 2023-03-23 DIAGNOSIS — I10 Essential (primary) hypertension: Secondary | ICD-10-CM | POA: Diagnosis present

## 2023-03-23 DIAGNOSIS — Z1152 Encounter for screening for COVID-19: Secondary | ICD-10-CM

## 2023-03-23 DIAGNOSIS — M542 Cervicalgia: Secondary | ICD-10-CM | POA: Diagnosis present

## 2023-03-23 DIAGNOSIS — Z1611 Resistance to penicillins: Secondary | ICD-10-CM | POA: Diagnosis present

## 2023-03-23 DIAGNOSIS — B961 Klebsiella pneumoniae [K. pneumoniae] as the cause of diseases classified elsewhere: Secondary | ICD-10-CM | POA: Diagnosis present

## 2023-03-23 DIAGNOSIS — Z87891 Personal history of nicotine dependence: Secondary | ICD-10-CM

## 2023-03-23 DIAGNOSIS — G43909 Migraine, unspecified, not intractable, without status migrainosus: Secondary | ICD-10-CM | POA: Diagnosis present

## 2023-03-23 DIAGNOSIS — N12 Tubulo-interstitial nephritis, not specified as acute or chronic: Secondary | ICD-10-CM

## 2023-03-23 DIAGNOSIS — R319 Hematuria, unspecified: Secondary | ICD-10-CM | POA: Diagnosis present

## 2023-03-23 DIAGNOSIS — M549 Dorsalgia, unspecified: Secondary | ICD-10-CM

## 2023-03-23 DIAGNOSIS — E876 Hypokalemia: Secondary | ICD-10-CM | POA: Diagnosis present

## 2023-03-23 LAB — CBC
HCT: 41 % (ref 36.0–46.0)
Hemoglobin: 12.8 g/dL (ref 12.0–15.0)
MCH: 24.7 pg — ABNORMAL LOW (ref 26.0–34.0)
MCHC: 31.2 g/dL (ref 30.0–36.0)
MCV: 79.2 fL — ABNORMAL LOW (ref 80.0–100.0)
Platelets: 210 10*3/uL (ref 150–400)
RBC: 5.18 MIL/uL — ABNORMAL HIGH (ref 3.87–5.11)
RDW: 13.5 % (ref 11.5–15.5)
WBC: 8.1 10*3/uL (ref 4.0–10.5)
nRBC: 0 % (ref 0.0–0.2)

## 2023-03-23 LAB — URINALYSIS, ROUTINE W REFLEX MICROSCOPIC
Bilirubin Urine: NEGATIVE
Glucose, UA: NEGATIVE mg/dL
Ketones, ur: 20 mg/dL — AB
Nitrite: NEGATIVE
Protein, ur: 100 mg/dL — AB
Specific Gravity, Urine: 1.019 (ref 1.005–1.030)
pH: 6 (ref 5.0–8.0)

## 2023-03-23 LAB — BASIC METABOLIC PANEL
Anion gap: 12 (ref 5–15)
BUN: 12 mg/dL (ref 6–20)
CO2: 21 mmol/L — ABNORMAL LOW (ref 22–32)
Calcium: 9.2 mg/dL (ref 8.9–10.3)
Chloride: 102 mmol/L (ref 98–111)
Creatinine, Ser: 1.08 mg/dL — ABNORMAL HIGH (ref 0.44–1.00)
GFR, Estimated: >60 mL/min (ref >60)
Glucose, Bld: 121 mg/dL — ABNORMAL HIGH (ref 70–99)
Potassium: 3.4 mmol/L — ABNORMAL LOW (ref 3.5–5.1)
Sodium: 135 mmol/L (ref 135–145)

## 2023-03-23 LAB — HEPATIC FUNCTION PANEL
ALT: 50 U/L — ABNORMAL HIGH (ref 0–44)
AST: 37 U/L (ref 15–41)
Albumin: 4.2 g/dL (ref 3.5–5.0)
Alkaline Phosphatase: 42 U/L (ref 38–126)
Bilirubin, Direct: 0.3 mg/dL — ABNORMAL HIGH (ref 0.0–0.2)
Indirect Bilirubin: 0.9 mg/dL (ref 0.3–0.9)
Total Bilirubin: 1.2 mg/dL (ref 0.3–1.2)
Total Protein: 7.7 g/dL (ref 6.5–8.1)

## 2023-03-23 LAB — SARS CORONAVIRUS 2 BY RT PCR: SARS Coronavirus 2 by RT PCR: NEGATIVE

## 2023-03-23 LAB — TROPONIN I (HIGH SENSITIVITY): Troponin I (High Sensitivity): 3 ng/L (ref <18)

## 2023-03-23 LAB — PROCALCITONIN: Procalcitonin: 0.51 ng/mL

## 2023-03-23 LAB — POC URINE PREG, ED: Preg Test, Ur: NEGATIVE

## 2023-03-23 LAB — MAGNESIUM: Magnesium: 1.8 mg/dL (ref 1.7–2.4)

## 2023-03-23 LAB — LACTIC ACID, PLASMA: Lactic Acid, Venous: 1.1 mmol/L (ref 0.5–1.9)

## 2023-03-23 LAB — HCG, QUANTITATIVE, PREGNANCY: hCG, Beta Chain, Quant, S: <1 m[IU]/mL (ref <5)

## 2023-03-23 LAB — TSH: TSH: 0.825 u[IU]/mL (ref 0.350–4.500)

## 2023-03-23 LAB — LIPASE, BLOOD: Lipase: 33 U/L (ref 11–51)

## 2023-03-23 LAB — T4, FREE: Free T4: 0.73 ng/dL (ref 0.61–1.12)

## 2023-03-23 MED ORDER — LACTATED RINGERS IV SOLN: INTRAVENOUS | Status: AC

## 2023-03-23 MED ORDER — ENOXAPARIN SODIUM 60 MG/0.6ML IJ SOSY
0.5000 mg/kg | PREFILLED_SYRINGE | INTRAMUSCULAR | Status: DC
  Administered 2023-03-23 – 2023-03-24 (×2): 47.5 mg via SUBCUTANEOUS
  Filled 2023-03-23 (×2): qty 0.6

## 2023-03-23 MED ORDER — PREDNISONE 20 MG PO TABS
20.0000 mg | ORAL_TABLET | Freq: Every day | ORAL | Status: AC
  Administered 2023-03-24 – 2023-03-25 (×2): 20 mg via ORAL
  Filled 2023-03-23 (×2): qty 1

## 2023-03-23 MED ORDER — SODIUM CHLORIDE 0.9 % IV SOLN
2.0000 g | Freq: Once | INTRAVENOUS | Status: AC
  Administered 2023-03-23: 2 g via INTRAVENOUS
  Filled 2023-03-23: qty 20

## 2023-03-23 MED ORDER — SODIUM CHLORIDE 0.9 % IV BOLUS
1000.0000 mL | Freq: Once | INTRAVENOUS | Status: AC
  Administered 2023-03-23: 1000 mL via INTRAVENOUS

## 2023-03-23 MED ORDER — HYDROCODONE-ACETAMINOPHEN 5-325 MG PO TABS
1.0000 | ORAL_TABLET | Freq: Four times a day (QID) | ORAL | Status: DC | PRN
  Administered 2023-03-23 – 2023-03-25 (×4): 1 via ORAL
  Filled 2023-03-23 (×4): qty 1

## 2023-03-23 MED ORDER — ACETAMINOPHEN 325 MG PO TABS
650.0000 mg | ORAL_TABLET | Freq: Four times a day (QID) | ORAL | Status: DC | PRN
  Administered 2023-03-24: 650 mg via ORAL
  Filled 2023-03-23: qty 2

## 2023-03-23 MED ORDER — SODIUM CHLORIDE 0.9% FLUSH
3.0000 mL | Freq: Two times a day (BID) | INTRAVENOUS | Status: DC
  Administered 2023-03-23 – 2023-03-25 (×5): 3 mL via INTRAVENOUS

## 2023-03-23 MED ORDER — FENTANYL CITRATE PF 50 MCG/ML IJ SOSY
50.0000 ug | PREFILLED_SYRINGE | Freq: Once | INTRAMUSCULAR | Status: AC
  Administered 2023-03-23: 50 ug via INTRAVENOUS
  Filled 2023-03-23: qty 1

## 2023-03-23 MED ORDER — ACETAMINOPHEN 500 MG PO TABS
1000.0000 mg | ORAL_TABLET | Freq: Once | ORAL | Status: AC
  Administered 2023-03-23: 1000 mg via ORAL
  Filled 2023-03-23: qty 2

## 2023-03-23 MED ORDER — ACETAMINOPHEN 650 MG RE SUPP: 650.0000 mg | Freq: Four times a day (QID) | RECTAL | Status: DC | PRN

## 2023-03-23 MED ORDER — SODIUM CHLORIDE 0.9 % IV SOLN
2.0000 g | INTRAVENOUS | Status: DC
  Administered 2023-03-24 – 2023-03-25 (×2): 2 g via INTRAVENOUS
  Filled 2023-03-23: qty 20
  Filled 2023-03-23: qty 2

## 2023-03-23 MED ORDER — ONDANSETRON HCL 4 MG/2ML IJ SOLN
4.0000 mg | Freq: Once | INTRAMUSCULAR | Status: AC
  Administered 2023-03-23: 4 mg via INTRAVENOUS
  Filled 2023-03-23: qty 2

## 2023-03-23 MED ORDER — ONDANSETRON HCL 4 MG/2ML IJ SOLN: 4.0000 mg | Freq: Four times a day (QID) | INTRAMUSCULAR | Status: DC | PRN

## 2023-03-23 MED ORDER — METHOCARBAMOL 500 MG PO TABS: 500.0000 mg | ORAL_TABLET | Freq: Three times a day (TID) | ORAL | Status: DC | PRN

## 2023-03-23 MED ORDER — ONDANSETRON HCL 4 MG PO TABS
4.0000 mg | ORAL_TABLET | Freq: Four times a day (QID) | ORAL | Status: DC | PRN
  Administered 2023-03-24: 4 mg via ORAL
  Filled 2023-03-23: qty 1

## 2023-03-23 MED ORDER — IOHEXOL 350 MG/ML SOLN
100.0000 mL | Freq: Once | INTRAVENOUS | Status: AC | PRN
  Administered 2023-03-23: 100 mL via INTRAVENOUS

## 2023-03-23 MED ORDER — POLYETHYLENE GLYCOL 3350 17 G PO PACK: 17.0000 g | PACK | Freq: Every day | ORAL | Status: DC | PRN

## 2023-03-23 NOTE — Assessment & Plan Note (Addendum)
Patient is presenting with 2-day history of cold sweats, urinary urgency, nausea and generalized malaise.  CT imaging and urinalysis consistent with acute right-sided pyelonephritis.  No stone identified.  SIRS criteria with tachycardia and fever but no evidence of sepsis at this time.  Patient does not appear toxic.  - Urine and blood cultures pending - Continue Rocephin 2 g daily - S/p 2 L IV fluids - Continue maintenance fluids - Tylenol as needed for fever - Norco as needed for pain control

## 2023-03-23 NOTE — Progress Notes (Signed)
CODE SEPSIS - PHARMACY COMMUNICATION  **Broad Spectrum Antibiotics should be administered within 1 hour of Sepsis diagnosis**  Time Code Sepsis Called/Page Received: 14:09  Antibiotics Ordered: Ceftriaxone  Time of 1st antibiotic administration: 14:33  Additional action taken by pharmacy: n/a  If necessary, Name of Provider/Nurse Contacted: n/a    Foye Deer ,PharmD Clinical Pharmacist  03/23/2023  3:02 PM

## 2023-03-23 NOTE — Assessment & Plan Note (Addendum)
Endorses right-sided neck pain consistent with musculoskeletal in addition to lower back pain.  Likely exacerbated due to acute illness, however unlikely to be contributory.  - Continue prednisone taper and Robaxin started at urgent care

## 2023-03-23 NOTE — Progress Notes (Signed)
[facility] will follow per sepsis protocol  

## 2023-03-23 NOTE — H&P (Addendum)
History and Physical    Patient: Sarah Holland ZOX:[id number] DOB: 1991/01/05 DOA: 03/23/2023 DOS: the patient was seen and examined on 03/23/2023 PCP: Enid Baas, MD  Patient coming from: Home  Chief Complaint:  Chief Complaint  Patient presents with   Dizziness   HPI: Sarah Holland is a 32 y.o. female with medical history significant of migraine headaches, gestational diabetes and hypertension, who presents to the ED with complaints of dizziness.  [name] states that for the last 1 week, she has been experiencing bilateral lower back pain.  She went to an urgent care and was told this is secondary to sciatica.  Then approximately 48 hours ago, she began to experience drenching cold sweats in addition to urinary urgency.  She attributed urinary urgency and frequency due to increased p.o. water intake.  She endorses nausea with inability to tolerate p.o. intake but denies any vomiting or diarrhea.  She endorses right lower quadrant soreness.  She denies any chest pain, shortness of breath, palpitations.  She began to experience right-sided neck pain today but denies any headache or dizziness.  ED course: On arrival to the ED, patient was hypertensive at 142/87 with heart rate of 125.  She was saturating at 100% on room air.  She was afebrile 99.2. Initial workup notable for WBC of 8.1, hemoglobin 12.8, potassium 3.4, bicarb 21, glucose 121, creatinine 1.08, AST 37, ALT 50 and GFR above 60.  Pregnancy test negative.  TSH and troponin within normal limits.  Urinalysis was obtained that demonstrated moderate hematuria, ketonuria, small leukocytes, many bacteria.  COVID-19 PCR negative.  CTA of the head and neck were obtained with no acute abnormalities.  CT of the abdomen was obtained that demonstrated right-sided pyelonephritis.  TRH contacted for admission for acute pyelonephritis.  Review of Systems: As mentioned in the history of present illness. All other systems reviewed and  are negative.  Past Medical History:  Diagnosis Date   Elevated blood pressure complicating pregnancy in third trimester, antepartum 09/27/2019   Encounter for induction of labor 09/28/2019   Family history of pancreatic cancer    8/23 cancer genetic testing letter sent   Gestational diabetes    Gestational hypertension 09/28/2019   Obesity (BMI 30-39.9)    Obesity complicating peripregnancy, antepartum 08/09/2019   Supervision of high risk pregnancy, antepartum 04/05/2019   [facility] Prenatal Labs Dating LMP Blood type: B/Positive/-- (04/22 1503)  Genetic Screen NIPS: Negative XY Antibody:Negative (04/22 1503) Anatomic [place] Complete 05/16/2019 Rubella: 3.96 (04/22 1503) Varicella: Immune GTT Early:               Third trimester: 204; 3 hr 98/219/204/140- RPR: Non Reactive (04/22 1503)  Rhogam N/A HBsAg: Negative (04/22 1503)  TDaP vaccine        08/02/19               Past Surgical History:  Procedure Laterality Date   APPENDECTOMY     Social History:  reports that she quit smoking about 5 years ago. Her smoking use included cigarettes. She has a 0.75 pack-year smoking history. She has never used smokeless tobacco. She reports that she does not drink alcohol and does not use drugs.  No Known Allergies  Family History  Problem Relation Age of Onset   Pancreatic cancer Father 67   Diabetes Maternal Grandmother    Pancreatic cancer Maternal Grandmother     Prior to Admission medications   Medication Sig Start Date End Date Taking? Authorizing Provider  cyclobenzaprine (FLEXERIL) 10 MG tablet Take 10 mg by mouth at bedtime as needed. 12/15/22   [provider]  medroxyPROGESTERone (DEPO-PROVERA) 150 MG/ML injection Inject 1 mL (150 mg total) into the muscle every 3 (three) months. 05/01/22   Horald Pollen, MD  naproxen (NAPROSYN) 500 MG tablet TAKE 1 TABLET BY MOUTH AFTER FOOD TWICE DAILY FOR 10 DAYS AND AFTER THAT AS NEEDED ONLY FOR PAIN 10/21/22   [provider]   SUMAtriptan (IMITREX) 100 MG tablet Take by mouth. 09/24/22 09/24/23  [provider]    Physical Exam: Vitals:   03/23/23 1130 03/23/23 1246 03/23/23 1429 03/23/23 1430  BP: 119/75   136/88  Pulse: (!) 101   (!) 110  Resp: (!) 22   15  Temp:  98.8 F (37.1 C) (!) 101.2 F (38.4 C)   TempSrc:  Oral Oral   SpO2: 100%   100%   Physical Exam Vitals and nursing note reviewed.  Constitutional:      General: She is not in acute distress.    Appearance: She is normal weight. She is not toxic-appearing.  HENT:     Head: Normocephalic and atraumatic.     Mouth/Throat:     Mouth: Mucous membranes are moist.     Pharynx: Oropharynx is clear.  Eyes:     Conjunctiva/sclera: Conjunctivae normal.     Pupils: Pupils are equal, round, and reactive to light.  Cardiovascular:     Rate and Rhythm: Regular rhythm. Tachycardia present.     Heart sounds: No murmur heard.    No gallop.  Pulmonary:     Effort: Pulmonary effort is normal. No respiratory distress.     Breath sounds: Normal breath sounds. No wheezing, rhonchi or rales.  Abdominal:     General: Bowel sounds are normal. There is no distension.     Palpations: Abdomen is soft.     Tenderness: There is no abdominal tenderness. There is right CVA tenderness. There is no left CVA tenderness or guarding.  Musculoskeletal:     Cervical back: Neck supple. Tenderness (Right paraspinal and trapezious region) present.     Right lower leg: No edema.     Left lower leg: No edema.  Skin:    General: Skin is warm and dry.  Neurological:     General: No focal deficit present.     Mental Status: She is alert and oriented to person, place, and time. Mental status is at baseline.  Psychiatric:        Mood and Affect: Mood normal.        Behavior: Behavior normal.    Data Reviewed: CBC with WBC of 8.1, hemoglobin 12.8, MCV 79 and platelets of 210 CMP with sodium of 135, potassium 3.4, chloride 102, bicarb 21, glucose 121, BUN 12,  creatinine 1.08, anion gap 12, AST 37, ALT 50, GFR above 60 Lipase within normal limits at 33 Magnesium within normal limits at 1.8 TSH within normal limits at 0.825 with free T4 of 0.73 Troponin negative at 3 Urine pregnancy test negative COVID-19 PCR negative Urinalysis with moderate hematuria, ketonuria, proteinuria, small leukocytes, many bacteria, and 21-50 WBC/hpf.  EKG personally reviewed.Sinus rhythm with rate of 130, no ST or T wave changes concerning for acute ischemia.  CT ABDOMEN PELVIS W CONTRAST  Result Date: 03/23/2023 CLINICAL DATA:  Abdominal pain, dizziness EXAM: CT ABDOMEN AND PELVIS WITH CONTRAST TECHNIQUE: Multidetector CT imaging of the abdomen and pelvis was performed using the standard protocol following bolus administration  of intravenous contrast. RADIATION DOSE REDUCTION: This exam was performed according to the departmental dose-optimization program which includes automated exposure control, adjustment of the mA and/or kV according to patient size and/or use of iterative reconstruction technique. CONTRAST:  OMNIPAQUE IOHEXOL 350 MG/ML SOLN COMPARISON:  09/18/2021 FINDINGS: Lower chest: No acute findings are seen. Hepatobiliary: No focal abnormalities are seen in liver. There is no dilation of bile ducts. Gallbladder is not distended. Pancreas: No focal abnormalities are seen. Spleen: Unremarkable. Adrenals/Urinary Tract: Adrenals are unremarkable. There is no hydronephrosis. Right kidney is larger than the left. There is patchy inhomogeneous enhancement in right renal cortex. There is mild right perinephric stranding. There is mild wall thickening in right renal pelvis. There are no renal or ureteral stones. Urinary bladder is unremarkable. Stomach/Bowel: Stomach is unremarkable. Small bowel loops are not dilated. The appendix is not seen. Surgical staples are seen in the tip of cecum, possibly suggesting previous appendectomy. There is no significant wall thickening in  colon. There is no pericolic stranding. Vascular/Lymphatic: Unremarkable. Reproductive: Uterus is retroverted. There is a 1.8 cm fat density in the right adnexa, possibly dermoid or partial volume averaging artifact. Other: There is no ascites or pneumoperitoneum. Umbilical hernia containing fat is seen. Minimal stranding in subcutaneous plane in suprapubic region may suggest residual scarring from previous surgery. Musculoskeletal: No acute findings are seen. IMPRESSION: There is patchy inhomogeneous cortical enhancement in right kidney suggesting acute pyelonephritis. There is no hydronephrosis. There is no loculated perinephric fluid collection. There is no evidence of intestinal obstruction. Other findings as described in the body of the report. Electronically Signed   By: Ernie Avena M.D.   On: 03/23/2023 13:59   CT ANGIO HEAD NECK W WO CM  Result Date: 03/23/2023 CLINICAL DATA:  Dizziness, stroke suspected EXAM: CT ANGIOGRAPHY HEAD AND NECK WITH AND WITHOUT CONTRAST TECHNIQUE: Multidetector CT imaging of the head and neck was performed using the standard protocol during bolus administration of intravenous contrast. Multiplanar CT image reconstructions and MIPs were obtained to evaluate the vascular anatomy. Carotid stenosis measurements (when applicable) are obtained utilizing NASCET criteria, using the distal internal carotid diameter as the denominator. RADIATION DOSE REDUCTION: This exam was performed according to the departmental dose-optimization program which includes automated exposure control, adjustment of the mA and/or kV according to patient size and/or use of iterative reconstruction technique. CONTRAST:  OMNIPAQUE IOHEXOL 350 MG/ML SOLN COMPARISON:  None Available. FINDINGS: CT HEAD FINDINGS Brain: No evidence of acute infarct, hemorrhage, mass, mass effect, or midline shift. No hydrocephalus or extra-axial fluid collection. Vascular: No hyperdense vessel. Skull: Negative for  fracture or focal lesion. Sinuses/Orbits: Mild mucosal thickening in the ethmoid air cells. No acute findings in the orbits. Other: The mastoid air cells are well aerated. CTA NECK FINDINGS Aortic arch: Standard branching. Imaged portion shows no evidence of aneurysm or dissection. No significant stenosis of the major arch vessel origins. Right carotid system: No evidence of stenosis, dissection, or occlusion. Left carotid system: No evidence of stenosis, dissection, or occlusion. Vertebral arteries: No evidence of stenosis, dissection, or occlusion. Skeleton: No acute osseous abnormality. Degenerative changes in the cervical spine. Other neck: Negative. Upper chest: No focal pulmonary opacity or pleural effusion. Review of the MIP images confirms the above findings CTA HEAD FINDINGS Evaluation is somewhat limited by bolus timing. Anterior circulation: Both internal carotid arteries are patent to the termini, without significant stenosis. ACA and MCA segments appear patent, without significant stenosis. Posterior circulation: Vertebral arteries and basilar  artery are patent without significant stenosis. Superior cerebellar arteries patent proximally. PCAs are patent without significant stenosis. The left posterior communicating artery is likely patent. Venous sinuses: As permitted by contrast timing, patent. Anatomic variants: None significant. Review of the MIP images confirms the above findings IMPRESSION: 1. No acute intracranial process. 2. No hemodynamically significant stenosis in the neck. 3. Evaluation is somewhat limited by bolus timing. Within this limitation, no intracranial large vessel occlusion or significant stenosis. Electronically Signed   By: Wiliam Ke M.D.   On: 03/23/2023 13:55   DG Chest 2 View  Result Date: 03/23/2023 CLINICAL DATA:  Dizziness. EXAM: CHEST - 2 VIEW COMPARISON:  May 10, 2015. FINDINGS: The heart size and mediastinal contours are within normal limits. Both lungs are  clear. The visualized skeletal structures are unremarkable. IMPRESSION: No active cardiopulmonary disease. Electronically Signed   By: Lupita Raider M.D.   On: 03/23/2023 12:10    Results are pending, will review when available.  Assessment and Plan:  * Acute pyelonephritis Patient is presenting with 2-day history of cold sweats, urinary urgency, nausea and generalized malaise.  CT imaging and urinalysis consistent with acute right-sided pyelonephritis.  No stone identified.  SIRS criteria with tachycardia and fever but no evidence of sepsis at this time.  Patient does not appear toxic.  - Urine and blood cultures pending - Continue Rocephin 2 g daily - S/p 2 L IV fluids - Continue maintenance fluids - Tylenol as needed for fever - Norco as needed for pain control  AKI (acute kidney injury) Patient endorses poor p.o. intake over the last couple days in addition to likely ongoing fevers.  Suspect AKI is prerenal in nature.  No stone or hydronephrosis seen on CT imaging.  - S/p 2 L IV fluids - Continue maintenance fluids - Avoid nephrotoxic agents - Strict in and out - Repeat BMP in the a.m.  Back pain Endorses right-sided neck pain consistent with musculoskeletal in addition to lower back pain.  Likely exacerbated due to acute illness, however unlikely to be contributory.  Advance Care Planning:   Code Status: Full Code verified by patient  Consults: None  Family Communication: No family at bedside  Severity of Illness: The appropriate patient status for this patient is OBSERVATION. Observation status is judged to be reasonable and necessary in order to provide the required intensity of service to ensure the patient's safety. The patient's presenting symptoms, physical exam findings, and initial radiographic and laboratory data in the context of their medical condition is felt to place them at decreased risk for further clinical deterioration. Furthermore, it is anticipated that  the patient will be medically stable for discharge from the hospital within 2 midnights of admission.   Author: Verdene Lennert, MD 03/23/2023 2:55 PM  For on call review [email or web address].

## 2023-03-23 NOTE — Assessment & Plan Note (Addendum)
Patient endorses poor p.o. intake over the last couple days in addition to likely ongoing fevers. In addition, patient has been taking Aleve for back pain. Overall, suspect AKI is prerenal in nature.  No stone or hydronephrosis seen on CT imaging.  - S/p 2 L IV fluids - Continue maintenance fluids - Avoid nephrotoxic agents - Strict in and out - Repeat BMP in the a.m.

## 2023-03-23 NOTE — Sepsis Progress Note (Signed)
Pt admitted rocephin given at 1433 blood cultures drawn at 1545. Notified nurse.

## 2023-03-23 NOTE — Progress Notes (Signed)
Pt expressed dizziness started this morning. Pt resting in bed and currently still feel dizzy at time. Denied headache. Notified attending MD. Per attending MD, no new meds at this time. Continue plan of care.

## 2023-03-23 NOTE — ED Notes (Signed)
Pt transported to CT ?

## 2023-03-23 NOTE — ED Provider Notes (Signed)
[facility] Provider Note    Event Date/Time   First MD Initiated Contact with Patient 03/23/23 1045     (approximate)   History   Dizziness   HPI  Sarah Holland is a 32 y.o. female otherwise healthy on Depo shot who comes in with concerns for dizziness.  Patient reports that since the past week she has had some low back pain and was told that it was sciatica.  She reports that she has had increasing nausea and difficulties eating and that she started developing some night sweats.  She reports that the pain started to get worse and this morning she noticed some dizziness.  She reports it feels more like she is lightheaded like she is going to pass out.  This is worse with movements.  She reports developing some right neck pain and some worsening low back pain more on the right.  Denies any obvious urinary symptoms.  She denies any chest pain, shortness of breath.  Denies any known sick contacts.   Physical Exam   Triage Vital Signs: ED Triage Vitals  Enc Vitals Group     BP 03/23/23 0944 (!) 136/99     Pulse Rate 03/23/23 0944 (!) 132     Resp 03/23/23 0944 18     Temp 03/23/23 0944 99.2 F (37.3 C)     Temp Source 03/23/23 0944 Oral     SpO2 03/23/23 0944 100 %     Weight --      Height --      Head Circumference --      Peak Flow --      Pain Score 03/23/23 0943 7     Pain Loc --      Pain Edu? --      Excl. in GC? --     Most recent vital signs: Vitals:   03/23/23 0944 03/23/23 1102  BP: (!) 136/99   Pulse: (!) 132   Resp: 18   Temp: 99.2 F (37.3 C) 99.7 F (37.6 C)  SpO2: 100%      General: Awake, no distress.  CV:  Good peripheral perfusion.  Resp:  Normal effort.  Abd:  No distention.  Right flank tenderness.  Some right neck tenderness but is able to range her neck fully.  No CT spine tenderness.  Some low L-spine tenderness.  Abdomen soft nontender.  Cranial nerves are intact equal strength in arms and  legs. Other:  Oropharynx is clear.  TMs are clear.  Full range of motion of neck.  Does report some right-sided neck discomfort.   ED Results / Procedures / Treatments   Labs (all labs ordered are listed, but only abnormal results are displayed) Labs Reviewed  BASIC METABOLIC PANEL - Abnormal; Notable for the following components:      Result Value   Potassium 3.4 (*)    CO2 21 (*)    Glucose, Bld 121 (*)    Creatinine, Ser 1.08 (*)    All other components within normal limits  CBC - Abnormal; Notable for the following components:   RBC 5.18 (*)    MCV 79.2 (*)    MCH 24.7 (*)    All other components within normal limits  URINALYSIS, ROUTINE W REFLEX MICROSCOPIC  HEPATIC FUNCTION PANEL  LIPASE, BLOOD  TSH  T4, FREE  HCG, QUANTITATIVE, PREGNANCY  MAGNESIUM  CBG MONITORING, ED  POC URINE PREG, ED  TROPONIN I (HIGH SENSITIVITY)     EKG  My interpretation of EKG:  Sinus tachycardia rate of 130 without any ST elevation or T wave inversions, normal intervals  RADIOLOGY I have reviewed the xray personally and interpreted no evidence of any pneumonia  PROCEDURES:  Critical Care performed:   .1-3 Lead EKG Interpretation  Performed by: Concha Se, MD Authorized by: Concha Se, MD     Interpretation: abnormal     ECG rate:  130   ECG rate assessment: tachycardic     Rhythm: sinus tachycardia     Ectopy: none     Conduction: normal      MEDICATIONS ORDERED IN ED: Medications  sodium chloride 0.9 % bolus 1,000 mL (0 mLs Intravenous Stopped 03/23/23 1314)  fentaNYL (SUBLIMAZE) injection 50 mcg (50 mcg Intravenous Given 03/23/23 1113)  ondansetron (ZOFRAN) injection 4 mg (4 mg Intravenous Given 03/23/23 1113)  sodium chloride 0.9 % bolus 1,000 mL (1,000 mLs Intravenous New Bag/Given 03/23/23 1317)  iohexol (OMNIPAQUE) 350 MG/ML injection 100 mL (100 mLs Intravenous Contrast Given 03/23/23 1323)     IMPRESSION / MDM / ASSESSMENT AND PLAN / ED COURSE  I reviewed  the triage vital signs and the nursing notes.   Patient's presentation is most consistent with acute presentation with potential threat to life or bodily function.   Patient comes in with 2 separate complaints 1 is right flank pain, nausea, decreased eating as well as tachycardia which I suspect could be UTI, Pilo, kidney stone but also this right neck pain with associated dizziness will get CT imaging to rule out dissection.  Will give some IV fentanyl IV Zofran help with symptoms.  Does not appear to be meningitic on examination.  BMP shows slightly elevated creatinine pregnancy test was negative urine which looks concerning for UTI will send for culture.  COVID test is negative.  Hepatic function test slightly elevated ALT but she is not really tender in her right upper quadrant thyroid is normal.  Troponin is negative.    CT imaging was reassuring, however-CT abdomen concerning for right pyelonephritis  Discussed with patient about discharge versus going home I rechecked her temperature and she is febrile we will place some Tylenol.  Patient is now meeting sepsis criteria with source of UTI, pyelonephritis.  Blood cultures, lactate ordered.  Patient felt more comfortable with admission given continued lightheadedness nausea etc.   The patient is on the cardiac monitor to evaluate for evidence of arrhythmia and/or significant heart rate changes.  Clinical Course as of 03/23/23 1400  Mon Mar 23, 2023  1346 CT ANGIO HEAD NECK W WO CM [MF]    Clinical Course User Index [MF] Concha Se, MD     FINAL CLINICAL IMPRESSION(S) / ED DIAGNOSES   Final diagnoses:  Dehydration  Lightheadedness  Pyelonephritis     Rx / DC Orders   ED Discharge Orders     None        Note:  This document was prepared using Dragon voice recognition software and may include unintentional dictation errors.   Concha Se, MD 03/23/23 [phone]

## 2023-03-23 NOTE — ED Triage Notes (Signed)
Patient to ED for dizziness that started this AM. Also c/o neck pain and lower back pain. Ambulatory to triage.

## 2023-03-24 DIAGNOSIS — Z1611 Resistance to penicillins: Secondary | ICD-10-CM | POA: Diagnosis present

## 2023-03-24 DIAGNOSIS — G43909 Migraine, unspecified, not intractable, without status migrainosus: Secondary | ICD-10-CM | POA: Diagnosis present

## 2023-03-24 DIAGNOSIS — Z6833 Body mass index (BMI) 33.0-33.9, adult: Secondary | ICD-10-CM | POA: Diagnosis not present

## 2023-03-24 DIAGNOSIS — N179 Acute kidney failure, unspecified: Secondary | ICD-10-CM | POA: Diagnosis present

## 2023-03-24 DIAGNOSIS — N10 Acute tubulo-interstitial nephritis: Secondary | ICD-10-CM | POA: Diagnosis present

## 2023-03-24 DIAGNOSIS — M5440 Lumbago with sciatica, unspecified side: Secondary | ICD-10-CM | POA: Diagnosis present

## 2023-03-24 DIAGNOSIS — E876 Hypokalemia: Secondary | ICD-10-CM

## 2023-03-24 DIAGNOSIS — Z1152 Encounter for screening for COVID-19: Secondary | ICD-10-CM | POA: Diagnosis not present

## 2023-03-24 DIAGNOSIS — N12 Tubulo-interstitial nephritis, not specified as acute or chronic: Secondary | ICD-10-CM | POA: Diagnosis present

## 2023-03-24 DIAGNOSIS — E669 Obesity, unspecified: Secondary | ICD-10-CM | POA: Diagnosis present

## 2023-03-24 DIAGNOSIS — M542 Cervicalgia: Secondary | ICD-10-CM | POA: Diagnosis present

## 2023-03-24 DIAGNOSIS — E860 Dehydration: Secondary | ICD-10-CM | POA: Diagnosis present

## 2023-03-24 DIAGNOSIS — R319 Hematuria, unspecified: Secondary | ICD-10-CM | POA: Diagnosis present

## 2023-03-24 DIAGNOSIS — A4159 Other Gram-negative sepsis: Secondary | ICD-10-CM | POA: Diagnosis present

## 2023-03-24 DIAGNOSIS — Z87891 Personal history of nicotine dependence: Secondary | ICD-10-CM | POA: Diagnosis not present

## 2023-03-24 DIAGNOSIS — Z8632 Personal history of gestational diabetes: Secondary | ICD-10-CM | POA: Diagnosis not present

## 2023-03-24 DIAGNOSIS — Z800 Family history of malignant neoplasm of digestive organs: Secondary | ICD-10-CM | POA: Diagnosis not present

## 2023-03-24 DIAGNOSIS — B961 Klebsiella pneumoniae [K. pneumoniae] as the cause of diseases classified elsewhere: Secondary | ICD-10-CM | POA: Diagnosis present

## 2023-03-24 DIAGNOSIS — I10 Essential (primary) hypertension: Secondary | ICD-10-CM | POA: Diagnosis present

## 2023-03-24 DIAGNOSIS — R6520 Severe sepsis without septic shock: Secondary | ICD-10-CM | POA: Diagnosis present

## 2023-03-24 DIAGNOSIS — Z833 Family history of diabetes mellitus: Secondary | ICD-10-CM | POA: Diagnosis not present

## 2023-03-24 LAB — CBC WITH DIFFERENTIAL/PLATELET
Abs Immature Granulocytes: 0.01 10*3/uL (ref 0.00–0.07)
Basophils Absolute: 0 10*3/uL (ref 0.0–0.1)
Basophils Relative: 1 %
Eosinophils Absolute: 0.1 10*3/uL (ref 0.0–0.5)
Eosinophils Relative: 1 %
HCT: 33 % — ABNORMAL LOW (ref 36.0–46.0)
Hemoglobin: 10.3 g/dL — ABNORMAL LOW (ref 12.0–15.0)
Immature Granulocytes: 0 %
Lymphocytes Relative: 22 %
Lymphs Abs: 0.9 10*3/uL (ref 0.7–4.0)
MCH: 24.5 pg — ABNORMAL LOW (ref 26.0–34.0)
MCHC: 31.2 g/dL (ref 30.0–36.0)
MCV: 78.4 fL — ABNORMAL LOW (ref 80.0–100.0)
Monocytes Absolute: 0.7 10*3/uL (ref 0.1–1.0)
Monocytes Relative: 16 %
Neutro Abs: 2.6 10*3/uL (ref 1.7–7.7)
Neutrophils Relative %: 60 %
Platelets: 150 10*3/uL (ref 150–400)
RBC: 4.21 MIL/uL (ref 3.87–5.11)
RDW: 13.8 % (ref 11.5–15.5)
WBC: 4.2 10*3/uL (ref 4.0–10.5)
nRBC: 0 % (ref 0.0–0.2)

## 2023-03-24 LAB — HIV ANTIBODY (ROUTINE TESTING W REFLEX): HIV Screen 4th Generation wRfx: NONREACTIVE

## 2023-03-24 LAB — COMPREHENSIVE METABOLIC PANEL
ALT: 37 U/L (ref 0–44)
AST: 24 U/L (ref 15–41)
Albumin: 3.1 g/dL — ABNORMAL LOW (ref 3.5–5.0)
Alkaline Phosphatase: 34 U/L — ABNORMAL LOW (ref 38–126)
Anion gap: 5 (ref 5–15)
BUN: 7 mg/dL (ref 6–20)
CO2: 22 mmol/L (ref 22–32)
Calcium: 8.2 mg/dL — ABNORMAL LOW (ref 8.9–10.3)
Chloride: 108 mmol/L (ref 98–111)
Creatinine, Ser: 0.79 mg/dL (ref 0.44–1.00)
GFR, Estimated: >60 mL/min (ref >60)
Glucose, Bld: 125 mg/dL — ABNORMAL HIGH (ref 70–99)
Potassium: 3.4 mmol/L — ABNORMAL LOW (ref 3.5–5.1)
Sodium: 135 mmol/L (ref 135–145)
Total Bilirubin: 0.7 mg/dL (ref 0.3–1.2)
Total Protein: 6.4 g/dL — ABNORMAL LOW (ref 6.5–8.1)

## 2023-03-24 LAB — CULTURE, BLOOD (ROUTINE X 2)

## 2023-03-24 LAB — URINE CULTURE

## 2023-03-24 MED ORDER — SUMATRIPTAN SUCCINATE 50 MG PO TABS
100.0000 mg | ORAL_TABLET | ORAL | Status: DC | PRN
  Administered 2023-03-24: 100 mg via ORAL
  Filled 2023-03-24 (×2): qty 2

## 2023-03-24 MED ORDER — POTASSIUM CHLORIDE CRYS ER 20 MEQ PO TBCR
20.0000 meq | EXTENDED_RELEASE_TABLET | Freq: Once | ORAL | Status: AC
  Administered 2023-03-24: 20 meq via ORAL
  Filled 2023-03-24: qty 1

## 2023-03-24 NOTE — Plan of Care (Signed)
  Problem: Activity: Goal: Risk for activity intolerance will decrease Outcome: Progressing   Problem: Nutrition: Goal: Adequate nutrition will be maintained Outcome: Progressing   Problem: Safety: Goal: Ability to remain free from injury will improve Outcome: Progressing   Problem: Pain Managment: Goal: General experience of comfort will improve Outcome: Progressing   Problem: Skin Integrity: Goal: Risk for impaired skin integrity will decrease Outcome: Progressing   

## 2023-03-24 NOTE — Progress Notes (Signed)
PROGRESS NOTE   HPI was taken from Dr. Huel Cote: Sarah Holland is a 32 y.o. female with medical history significant of migraine headaches, gestational diabetes and hypertension, who presents to the ED with complaints of dizziness.   Sarah Holland states that for the last 1 week, she has been experiencing bilateral lower back pain.  She went to an urgent care and was told this is secondary to sciatica.  Then approximately 48 hours ago, she began to experience drenching cold sweats in addition to urinary urgency.  She attributed urinary urgency and frequency due to increased p.o. water intake.  She endorses nausea with inability to tolerate p.o. intake but denies any vomiting or diarrhea.  She endorses right lower quadrant soreness.  She denies any chest pain, shortness of breath, palpitations.  She began to experience right-sided neck pain today but denies any headache or dizziness.   ED course: On arrival to the ED, patient was hypertensive at 142/87 with heart rate of 125.  She was saturating at 100% on room air.  She was afebrile 99.2. Initial workup notable for WBC of 8.1, hemoglobin 12.8, potassium 3.4, bicarb 21, glucose 121, creatinine 1.08, AST 37, ALT 50 and GFR above 60.  Pregnancy test negative.  TSH and troponin within normal limits.  Urinalysis was obtained that demonstrated moderate hematuria, ketonuria, small leukocytes, many bacteria.  COVID-19 PCR negative.  CTA of the head and neck were obtained with no acute abnormalities.  CT of the abdomen was obtained that demonstrated right-sided pyelonephritis.  TR[facility] contacted for admission for acute pyelonephritis   Sarah Holland  ZOX:[id number] DOB: Apr 26, 1991 DOA: 03/23/2023 PCP: Enid Baas, MD  Assessment & Plan:   Principal Problem:   Acute pyelonephritis Active Problems:   AKI (acute kidney injury)   Back pain  Assessment and Plan: Acute pyelonephritis: CT imaging and urinalysis consistent with acute right-sided  pyelonephritis. SIRS criteria with tachycardia and fever but no evidence of sepsis at this time.  Continue on IV rocephin. Continue on IVFs. Urine cx is growing gram neg rods. Blood cxs NGTD    AKI: likely secondary to above. Continue on IVFs. Cr is back WNL today   Hypokalemia: potassium given    Back pain: likely secondary to MSK back pain.   Obesity: BMI 33.2. Would benefit from weight loss   Migraines: continue on home dose of sumatriptan prn    DVT prophylaxis: lovenox  Code Status: full  Family Communication:  Disposition Plan: likely d/c back home   Level of care: Telemetry Medical  Status is: Observation The patient remains OBS appropriate and will d/c before 2 midnights.   Consultants:    Procedures:   Antimicrobials: rocephin   Subjective: Pt c/o back pain and headache   Objective: Vitals:   03/23/23 1429 03/23/23 1430 03/23/23 1522 03/23/23 2348  BP:  136/88 124/75 108/65  Pulse:  (!) 110 (!) 104 93  Resp:  Temp: (!) 101.2 F (38.4 C)  99.9 F (37.7 C) 99.3 F (37.4 C)  TempSrc: Oral  Oral   SpO2:  100% 100% 100%  Weight:   93.5 kg   Height:    (1.676 m)     Intake/Output Summary (Last 24 hours) at 03/24/2023 0740 Last data filed at 03/23/2023 1615 Gross per 24 hour  Intake 2170.46 ml  Output --  Net 2170.46 ml   Filed Weights   03/23/23 1522  Weight: 93.5 kg    Examination:  General exam: Appears  calm and comfortable  Respiratory system: Clear to auscultation. Respiratory effort normal. Cardiovascular system: S1 & S2+. No rubs, gallops or clicks.  Gastrointestinal system: Abdomen is obese, soft and nontender. Normal bowel sounds heard. Central nervous system: Alert and oriented. Moves all extremities  Psychiatry: Judgement and insight appear normal. Mood & affect appropriate.     Data Reviewed: I have personally reviewed following labs and imaging studies  CBC: Recent Labs  Lab 03/23/23 0944 03/24/23 0632  WBC  8.1 4.2  NEUTROABS  --  2.6  HGB 12.8 10.3*  HCT 41.0 33.0*  MCV 79.2* 78.4*  PLT 210 150   Basic Metabolic Panel: Recent Labs  Lab 03/23/23 0944 03/23/23 0945 03/24/23 0632  NA 135  --  135  K 3.4*  --  3.4*  CL 102  --  108  CO2 21*  --  22  GLUCOSE 121*  --  125*  BUN 12  --  7  CREATININE 1.08*  --  0.79  CALCIUM 9.2  --  8.2*  MG  --  1.8  --    GFR: Estimated Creatinine Clearance: 115.3 mL/min (by C-G formula based on SCr of 0.79 mg/dL). Liver Function Tests: Recent Labs  Lab 03/23/23 0945 03/24/23 0632  AST 37 24  ALT 50* 37  ALKPHOS 42 34*  BILITOT 1.2 0.7  PROT 7.7 6.4*  ALBUMIN 4.2 3.1*   Recent Labs  Lab 03/23/23 0945  LIPASE 33   No results for input(s): "AMMONIA" in the last 168 hours. Coagulation Profile: No results for input(s): "INR", "PROTIME" in the last 168 hours. Cardiac Enzymes: No results for input(s): "CKTOTAL", "CKMB", "CKMBINDEX", "TROPONINI" in the last 168 hours. BNP (last 3 results) No results for input(s): "PROBNP" in the last 8760 hours. HbA1C: No results for input(s): "HGBA1C" in the last 72 hours. CBG: No results for input(s): "GLUCAP" in the last 168 hours. Lipid Profile: No results for input(s): "CHOL", "HDL", "LDLCALC", "TRIG", "CHOLHDL", "LDLDIRECT" in the last 72 hours. Thyroid Function Tests: Recent Labs    03/23/23 0945  TSH 0.825  FREET4 0.73   Anemia Panel: No results for input(s): "VITAMINB12", "FOLATE", "FERRITIN", "TIBC", "IRON", "RETICCTPCT" in the last 72 hours. Sepsis Labs: Recent Labs  Lab 03/23/23 0945 03/23/23 1539  PROCALCITON 0.51  --   LATICACIDVEN  --  1.1    Recent Results (from the past 240 hour(s))  SARS Coronavirus 2 by RT PCR (hospital order, performed in [facility] hospital lab) *cepheid single result test* Anterior Nasal Swab     Status: None   Collection Time: 03/23/23 11:06 AM   Specimen: Anterior Nasal Swab  Result Value Ref Range Status   SARS Coronavirus 2 by RT PCR  NEGATIVE NEGATIVE Final    Comment: (NOTE) SARS-CoV-2 target nucleic acids are NOT DETECTED.  The SARS-CoV-2 RNA is generally detectable in upper and lower respiratory specimens during the acute phase of infection. The lowest concentration of SARS-CoV-2 viral copies this assay can detect is 250 copies / mL. A negative result does not preclude SARS-CoV-2 infection and should not be used as the sole basis for treatment or other patient management decisions.  A negative result may occur with improper specimen collection / handling, submission of specimen other than nasopharyngeal swab, presence of viral mutation(s) within the areas targeted by this assay, and inadequate number of viral copies (<250 copies / mL). A negative result must be combined with clinical observations, patient history, and epidemiological information.  Fact Sheet for Patients:  RoadLapTop.co.za  Fact Sheet for Healthcare Providers: http://kim-miller.com/  This test is not yet approved or  cleared by the [place] FDA and has been authorized for detection and/or diagnosis of SARS-CoV-2 by FDA under an Emergency Use Authorization (EUA).  This EUA will remain in effect (meaning this test can be used) for the duration of the COVID-19 declaration under Section 564(b)(1) of the Act, 21 U.S.C. section 360bbb-3(b)(1), unless the authorization is terminated or revoked sooner.  Performed at [facility], [address] [place], [place] [address]   Blood culture (routine x 2)     Status: None (Preliminary result)   Collection Time: 03/23/23  3:39 PM   Specimen: BLOOD  Result Value Ref Range Status   Specimen Description BLOOD RIGHT ANTECUBITAL  Final   Special Requests   Final    BOTTLES DRAWN AEROBIC AND ANAEROBIC Blood Culture results may not be optimal due to an excessive volume of blood received in culture bottles   Culture   Final    NO GROWTH < 24  HOURS Performed at [facility], [address], [place], [place] [address]    Report Status PENDING  Incomplete  Blood culture (routine x 2)     Status: None (Preliminary result)   Collection Time: 03/23/23  3:45 PM   Specimen: BLOOD  Result Value Ref Range Status   Specimen Description BLOOD BLOOD LEFT HAND  Final   Special Requests   Final    BOTTLES DRAWN AEROBIC AND ANAEROBIC Blood Culture adequate volume   Culture   Final    NO GROWTH < 24 HOURS Performed at [facility], [address], [place], [place] [address]    Report Status PENDING  Incomplete         Radiology Studies: CT ABDOMEN PELVIS W CONTRAST  Result Date: 03/23/2023 CLINICAL DATA:  Abdominal pain, dizziness EXAM: CT ABDOMEN AND PELVIS WITH CONTRAST TECHNIQUE: Multidetector CT imaging of the abdomen and pelvis was performed using the standard protocol following bolus administration of intravenous contrast. RADIATION DOSE REDUCTION: This exam was performed according to the departmental dose-optimization program which includes automated exposure control, adjustment of the mA and/or kV according to patient size and/or use of iterative reconstruction technique. CONTRAST:  OMNIPAQUE IOHEXOL 350 MG/ML SOLN COMPARISON:  09/18/2021 FINDINGS: Lower chest: No acute findings are seen. Hepatobiliary: No focal abnormalities are seen in liver. There is no dilation of bile ducts. Gallbladder is not distended. Pancreas: No focal abnormalities are seen. Spleen: Unremarkable. Adrenals/Urinary Tract: Adrenals are unremarkable. There is no hydronephrosis. Right kidney is larger than the left. There is patchy inhomogeneous enhancement in right renal cortex. There is mild right perinephric stranding. There is mild wall thickening in right renal pelvis. There are no renal or ureteral stones. Urinary bladder is unremarkable. Stomach/Bowel: Stomach is unremarkable. Small bowel loops are not dilated. The appendix is not  seen. Surgical staples are seen in the tip of cecum, possibly suggesting previous appendectomy. There is no significant wall thickening in colon. There is no pericolic stranding. Vascular/Lymphatic: Unremarkable. Reproductive: Uterus is retroverted. There is a 1.8 cm fat density in the right adnexa, possibly dermoid or partial volume averaging artifact. Other: There is no ascites or pneumoperitoneum. Umbilical hernia containing fat is seen. Minimal stranding in subcutaneous plane in suprapubic region may suggest residual scarring from previous surgery. Musculoskeletal: No acute findings are seen. IMPRESSION: There is patchy inhomogeneous cortical enhancement in right kidney suggesting acute pyelonephritis. There is no hydronephrosis. There is  no loculated perinephric fluid collection. There is no evidence of intestinal obstruction. Other findings as described in the body of the report. Electronically Signed   By: Ernie Avena M.D.   On: 03/23/2023 13:59   CT ANGIO HEAD NECK W WO CM  Result Date: 03/23/2023 CLINICAL DATA:  Dizziness, stroke suspected EXAM: CT ANGIOGRAPHY HEAD AND NECK WITH AND WITHOUT CONTRAST TECHNIQUE: Multidetector CT imaging of the head and neck was performed using the standard protocol during bolus administration of intravenous contrast. Multiplanar CT image reconstructions and MIPs were obtained to evaluate the vascular anatomy. Carotid stenosis measurements (when applicable) are obtained utilizing NASCET criteria, using the distal internal carotid diameter as the denominator. RADIATION DOSE REDUCTION: This exam was performed according to the departmental dose-optimization program which includes automated exposure control, adjustment of the mA and/or kV according to patient size and/or use of iterative reconstruction technique. CONTRAST:  OMNIPAQUE IOHEXOL 350 MG/ML SOLN COMPARISON:  None Available. FINDINGS: CT HEAD FINDINGS Brain: No evidence of acute infarct, hemorrhage,  mass, mass effect, or midline shift. No hydrocephalus or extra-axial fluid collection. Vascular: No hyperdense vessel. Skull: Negative for fracture or focal lesion. Sinuses/Orbits: Mild mucosal thickening in the ethmoid air cells. No acute findings in the orbits. Other: The mastoid air cells are well aerated. CTA NECK FINDINGS Aortic arch: Standard branching. Imaged portion shows no evidence of aneurysm or dissection. No significant stenosis of the major arch vessel origins. Right carotid system: No evidence of stenosis, dissection, or occlusion. Left carotid system: No evidence of stenosis, dissection, or occlusion. Vertebral arteries: No evidence of stenosis, dissection, or occlusion. Skeleton: No acute osseous abnormality. Degenerative changes in the cervical spine. Other neck: Negative. Upper chest: No focal pulmonary opacity or pleural effusion. Review of the MIP images confirms the above findings CTA HEAD FINDINGS Evaluation is somewhat limited by bolus timing. Anterior circulation: Both internal carotid arteries are patent to the termini, without significant stenosis. ACA and MCA segments appear patent, without significant stenosis. Posterior circulation: Vertebral arteries and basilar artery are patent without significant stenosis. Superior cerebellar arteries patent proximally. PCAs are patent without significant stenosis. The left posterior communicating artery is likely patent. Venous sinuses: As permitted by contrast timing, patent. Anatomic variants: None significant. Review of the MIP images confirms the above findings IMPRESSION: 1. No acute intracranial process. 2. No hemodynamically significant stenosis in the neck. 3. Evaluation is somewhat limited by bolus timing. Within this limitation, no intracranial large vessel occlusion or significant stenosis. Electronically Signed   By: Wiliam Ke M.D.   On: 03/23/2023 13:55   DG Chest 2 View  Result Date: 03/23/2023 CLINICAL DATA:  Dizziness. EXAM:  CHEST - 2 VIEW COMPARISON:  May 10, 2015. FINDINGS: The heart size and mediastinal contours are within normal limits. Both lungs are clear. The visualized skeletal structures are unremarkable. IMPRESSION: No active cardiopulmonary disease. Electronically Signed   By: Lupita Raider M.D.   On: 03/23/2023 12:10        Scheduled Meds:  enoxaparin (LOVENOX) injection  0.5 mg/kg Subcutaneous Q24H   predniSONE  20 mg Oral Q breakfast   sodium chloride flush  3 mL Intravenous Q12H   Continuous Infusions:  cefTRIAXone (ROCEPHIN)  IV       LOS: 0 days    Time spent: 35 mins     Charise Killian, MD [facility] Pager [phone]  If 7PM-7AM, please contact night-coverage [email or web address] 03/24/2023, 7:40 AM

## 2023-03-25 DIAGNOSIS — N10 Acute tubulo-interstitial nephritis: Secondary | ICD-10-CM | POA: Diagnosis not present

## 2023-03-25 LAB — CBC
HCT: 34.3 % — ABNORMAL LOW (ref 36.0–46.0)
Hemoglobin: 10.8 g/dL — ABNORMAL LOW (ref 12.0–15.0)
MCH: 25.1 pg — ABNORMAL LOW (ref 26.0–34.0)
MCHC: 31.5 g/dL (ref 30.0–36.0)
MCV: 79.8 fL — ABNORMAL LOW (ref 80.0–100.0)
Platelets: 188 10*3/uL (ref 150–400)
RBC: 4.3 MIL/uL (ref 3.87–5.11)
RDW: 13.9 % (ref 11.5–15.5)
WBC: 5.3 10*3/uL (ref 4.0–10.5)
nRBC: 0 % (ref 0.0–0.2)

## 2023-03-25 LAB — BASIC METABOLIC PANEL
Anion gap: 5 (ref 5–15)
BUN: 10 mg/dL (ref 6–20)
CO2: 24 mmol/L (ref 22–32)
Calcium: 8.7 mg/dL — ABNORMAL LOW (ref 8.9–10.3)
Chloride: 111 mmol/L (ref 98–111)
Creatinine, Ser: 0.77 mg/dL (ref 0.44–1.00)
GFR, Estimated: >60 mL/min (ref >60)
Glucose, Bld: 112 mg/dL — ABNORMAL HIGH (ref 70–99)
Potassium: 3.7 mmol/L (ref 3.5–5.1)
Sodium: 140 mmol/L (ref 135–145)

## 2023-03-25 LAB — URINE CULTURE: Culture: >=100000 — AB

## 2023-03-25 LAB — CULTURE, BLOOD (ROUTINE X 2): Special Requests: ADEQUATE

## 2023-03-25 MED ORDER — AMOXICILLIN-POT CLAVULANATE 875-125 MG PO TABS: 1.0000 | ORAL_TABLET | Freq: Two times a day (BID) | ORAL | 0 refills | Status: AC

## 2023-03-25 MED ORDER — PREDNISONE 20 MG PO TABS: ORAL_TABLET | ORAL | Status: DC

## 2023-03-25 NOTE — Progress Notes (Signed)
  Transition of Care [facility]) Screening Note   Patient Details  Name: TAKILA KRONBERG Date of Birth: 1991-08-26   Transition of Care [facility]) CM/SW Contact:    Garret Reddish, RN Phone Number: 03/25/2023, 11:18 AM    Transition of Care Department [facility]) has reviewed patient and no TOC needs have been identified at this time. We will continue to monitor patient advancement through interdisciplinary progression rounds. If new patient transition needs arise, please place a TOC consult.  Chart reviewed.  Noted that patient was admitted with Acute Pylelonphritis.  Noted that patient's Urine Cx growing gram negative rods.  Blood cultures no growth to date.  Patient continues to be on IV Rocephin.  Will continue to follow for any disposition needs.

## 2023-03-25 NOTE — Discharge Summary (Signed)
Physician Discharge Summary   Sarah Holland  female DOB: February 10, 1991  WUJ:[id number]  PCP: Enid Baas, MD  Admit date: 03/23/2023 Discharge date: 03/25/2023  Admitted From: home Disposition:  home CODE STATUS: Full code  Discharge Instructions     Discharge instructions   Complete by: As directed    You have received 3 days of IV antibiotic for your right kidney infection.  Please finish 11 more days of oral antibiotic Augmentin as directed starting 03/26/23.  Please hold your outpatient prescription of prednisone while you are on antibiotic for kidney infection.  After you finish the antibiotic, you can resume prednisone taper if you still have lower back pain.   Dr. Darlin Priestly [facility] Course:  For full details, please see H&P, progress notes, consult notes and ancillary notes.  Briefly,  Sarah Holland is a 32 y.o. female with medical history significant of migraine headaches, gestational diabetes and hypertension, who presented to the ED with complaints of dizziness.   Mrs. Prichett states that for the last 1 week, she has been experiencing bilateral lower back pain.  She went to an urgent care and was told this is secondary to sciatica and was prescribed prednisone.  Then approximately 48 hours PTA, she began to experience drenching cold sweats in addition to urinary urgency.    Acute pyelonephritis, right CT imaging and urinalysis consistent with acute right-sided pyelonephritis.  Urine cx grew Klebsiella.  Pt received 3 days of ceftriaxone and discharged on 11 more days of Augmentin.  Severe Sepsis --presented with tachycardia and fever, with kidney and urinary source of infection, with AKI   AKI:  --Cr 1.08 on presentation.  Improved to 0.77 on the day of discharge.   Hypokalemia:  --monitored and repleted PRN   Back pain:  --pain located near buttock area.  Pt was prescribed prednisone from outpatient clinic.  Advised to hold prednisone while  on abx treatment for pyelonephritis.  Can resume prednisone taper after finishing abx treatment if lower back pain still present.   Obesity: BMI 33.2.  Would benefit from weight loss    Migraines:  continue on home dose of sumatriptan prn    Discharge Diagnoses:  Principal Problem:   Acute pyelonephritis Active Problems:   AKI (acute kidney injury)   Back pain   Pyelonephritis   30 Day Unplanned Readmission Risk Score    Flowsheet Row ED to Hosp-Admission (Current) from 03/23/2023 in [facility] (1A)  30 Day Unplanned Readmission Risk Score (%) 11.57 Filed at 03/25/2023 1200       This score is the patient's risk of an unplanned readmission within 30 days of being discharged (0 -100%). The score is based on dignosis, age, lab data, medications, orders, and past utilization.   Low:  0-14.9   Medium: 15-21.9   High: 22-29.9   Extreme: 30 and above         Discharge Instructions:  Allergies as of 03/25/2023   No Known Allergies      Medication List     STOP taking these medications    cyclobenzaprine 10 MG tablet Commonly known as: FLEXERIL   medroxyPROGESTERone 150 MG/ML injection Commonly known as: Depo-Provera   naproxen 500 MG tablet Commonly known as: NAPROSYN       TAKE these medications    amoxicillin-clavulanate 875-125 MG tablet Commonly known as: AUGMENTIN Take 1 tablet by mouth 2 (two) times daily for 11 days. Start taking  on: March 26, 2023   methocarbamol 500 MG tablet Commonly known as: ROBAXIN Take 500 mg by mouth 4 (four) times daily as needed.   predniSONE 20 MG tablet Commonly known as: DELTASONE 1 TABLET BY MOUTH TWICE A DAY ON DAYS 1-4, THEN ONE DAILY DAYS 5-8. Hold this medication while on antibiotic Augmentin for your kidney infection.  Can start taking this again after finishing Augmentin if lower back pain still present. What changed:  how to take this additional instructions   SUMAtriptan  100 MG tablet Commonly known as: IMITREX Take by mouth.         Follow-up Information     Enid Baas, MD Follow up in 1 week(s).   Specialty: Internal Medicine Contact information: [address] [place] [address] [phone]                 No Known Allergies   The results of significant diagnostics from this hospitalization (including imaging, microbiology, ancillary and laboratory) are listed below for reference.   Consultations:   Procedures/Studies: CT ABDOMEN PELVIS W CONTRAST  Result Date: 03/23/2023 CLINICAL DATA:  Abdominal pain, dizziness EXAM: CT ABDOMEN AND PELVIS WITH CONTRAST TECHNIQUE: Multidetector CT imaging of the abdomen and pelvis was performed using the standard protocol following bolus administration of intravenous contrast. RADIATION DOSE REDUCTION: This exam was performed according to the departmental dose-optimization program which includes automated exposure control, adjustment of the mA and/or kV according to patient size and/or use of iterative reconstruction technique. CONTRAST:  OMNIPAQUE IOHEXOL 350 MG/ML SOLN COMPARISON:  09/18/2021 FINDINGS: Lower chest: No acute findings are seen. Hepatobiliary: No focal abnormalities are seen in liver. There is no dilation of bile ducts. Gallbladder is not distended. Pancreas: No focal abnormalities are seen. Spleen: Unremarkable. Adrenals/Urinary Tract: Adrenals are unremarkable. There is no hydronephrosis. Right kidney is larger than the left. There is patchy inhomogeneous enhancement in right renal cortex. There is mild right perinephric stranding. There is mild wall thickening in right renal pelvis. There are no renal or ureteral stones. Urinary bladder is unremarkable. Stomach/Bowel: Stomach is unremarkable. Small bowel loops are not dilated. The appendix is not seen. Surgical staples are seen in the tip of cecum, possibly suggesting previous appendectomy. There is no significant  wall thickening in colon. There is no pericolic stranding. Vascular/Lymphatic: Unremarkable. Reproductive: Uterus is retroverted. There is a 1.8 cm fat density in the right adnexa, possibly dermoid or partial volume averaging artifact. Other: There is no ascites or pneumoperitoneum. Umbilical hernia containing fat is seen. Minimal stranding in subcutaneous plane in suprapubic region may suggest residual scarring from previous surgery. Musculoskeletal: No acute findings are seen. IMPRESSION: There is patchy inhomogeneous cortical enhancement in right kidney suggesting acute pyelonephritis. There is no hydronephrosis. There is no loculated perinephric fluid collection. There is no evidence of intestinal obstruction. Other findings as described in the body of the report. Electronically Signed   By: Ernie Avena M.D.   On: 03/23/2023 13:59   CT ANGIO HEAD NECK W WO CM  Result Date: 03/23/2023 CLINICAL DATA:  Dizziness, stroke suspected EXAM: CT ANGIOGRAPHY HEAD AND NECK WITH AND WITHOUT CONTRAST TECHNIQUE: Multidetector CT imaging of the head and neck was performed using the standard protocol during bolus administration of intravenous contrast. Multiplanar CT image reconstructions and MIPs were obtained to evaluate the vascular anatomy. Carotid stenosis measurements (when applicable) are obtained utilizing NASCET criteria, using the distal internal carotid diameter as the denominator. RADIATION DOSE REDUCTION: This exam was performed  according to the departmental dose-optimization program which includes automated exposure control, adjustment of the mA and/or kV according to patient size and/or use of iterative reconstruction technique. CONTRAST:  OMNIPAQUE IOHEXOL 350 MG/ML SOLN COMPARISON:  None Available. FINDINGS: CT HEAD FINDINGS Brain: No evidence of acute infarct, hemorrhage, mass, mass effect, or midline shift. No hydrocephalus or extra-axial fluid collection. Vascular: No hyperdense vessel.  Skull: Negative for fracture or focal lesion. Sinuses/Orbits: Mild mucosal thickening in the ethmoid air cells. No acute findings in the orbits. Other: The mastoid air cells are well aerated. CTA NECK FINDINGS Aortic arch: Standard branching. Imaged portion shows no evidence of aneurysm or dissection. No significant stenosis of the major arch vessel origins. Right carotid system: No evidence of stenosis, dissection, or occlusion. Left carotid system: No evidence of stenosis, dissection, or occlusion. Vertebral arteries: No evidence of stenosis, dissection, or occlusion. Skeleton: No acute osseous abnormality. Degenerative changes in the cervical spine. Other neck: Negative. Upper chest: No focal pulmonary opacity or pleural effusion. Review of the MIP images confirms the above findings CTA HEAD FINDINGS Evaluation is somewhat limited by bolus timing. Anterior circulation: Both internal carotid arteries are patent to the termini, without significant stenosis. ACA and MCA segments appear patent, without significant stenosis. Posterior circulation: Vertebral arteries and basilar artery are patent without significant stenosis. Superior cerebellar arteries patent proximally. PCAs are patent without significant stenosis. The left posterior communicating artery is likely patent. Venous sinuses: As permitted by contrast timing, patent. Anatomic variants: None significant. Review of the MIP images confirms the above findings IMPRESSION: 1. No acute intracranial process. 2. No hemodynamically significant stenosis in the neck. 3. Evaluation is somewhat limited by bolus timing. Within this limitation, no intracranial large vessel occlusion or significant stenosis. Electronically Signed   By: Wiliam Ke M.D.   On: 03/23/2023 13:55   DG Chest 2 View  Result Date: 03/23/2023 CLINICAL DATA:  Dizziness. EXAM: CHEST - 2 VIEW COMPARISON:  May 10, 2015. FINDINGS: The heart size and mediastinal contours are within normal limits.  Both lungs are clear. The visualized skeletal structures are unremarkable. IMPRESSION: No active cardiopulmonary disease. Electronically Signed   By: Lupita Raider M.D.   On: 03/23/2023 12:10      Labs: BNP (last 3 results) No results for input(s): "BNP" in the last 8760 hours. Basic Metabolic Panel: Recent Labs  Lab 03/23/23 0944 03/23/23 0945 03/24/23 0632 03/25/23 0428  NA 135  --  135 140  K 3.4*  --  3.4* 3.7  CL 102  --  108 111  CO2 21*  --  22 24  GLUCOSE 121*  --  125* 112*  BUN 12  --  7 10  CREATININE 1.08*  --  0.79 0.77  CALCIUM 9.2  --  8.2* 8.7*  MG  --  1.8  --   --    Liver Function Tests: Recent Labs  Lab 03/23/23 0945 03/24/23 0632  AST 37 24  ALT 50* 37  ALKPHOS 42 34*  BILITOT 1.2 0.7  PROT 7.7 6.4*  ALBUMIN 4.2 3.1*   Recent Labs  Lab 03/23/23 0945  LIPASE 33   No results for input(s): "AMMONIA" in the last 168 hours. CBC: Recent Labs  Lab 03/23/23 0944 03/24/23 0632 03/25/23 0428  WBC 8.1 4.2 5.3  NEUTROABS  --  2.6  --   HGB 12.8 10.3* 10.8*  HCT 41.0 33.0* 34.3*  MCV 79.2* 78.4* 79.8*  PLT 210 150 188   Cardiac  Enzymes: No results for input(s): "CKTOTAL", "CKMB", "CKMBINDEX", "TROPONINI" in the last 168 hours. BNP: Invalid input(s): "POCBNP" CBG: No results for input(s): "GLUCAP" in the last 168 hours. D-Dimer No results for input(s): "DDIMER" in the last 72 hours. Hgb A1c No results for input(s): "HGBA1C" in the last 72 hours. Lipid Profile No results for input(s): "CHOL", "HDL", "LDLCALC", "TRIG", "CHOLHDL", "LDLDIRECT" in the last 72 hours. Thyroid function studies Recent Labs    03/23/23 0945  TSH 0.825   Anemia work up No results for input(s): "VITAMINB12", "FOLATE", "FERRITIN", "TIBC", "IRON", "RETICCTPCT" in the last 72 hours. Urinalysis    Component Value Date/Time   COLORURINE YELLOW (A) 03/23/2023 1106   APPEARANCEUR HAZY (A) 03/23/2023 1106   APPEARANCEUR CLOUDY 06/27/2014 1153   LABSPEC 1.019  03/23/2023 1106   LABSPEC 1.024 06/27/2014 1153   PHURINE 6.0 03/23/2023 1106   GLUCOSEU NEGATIVE 03/23/2023 1106   GLUCOSEU see comment 06/27/2014 1153   HGBUR MODERATE (A) 03/23/2023 1106   BILIRUBINUR NEGATIVE 03/23/2023 1106   BILIRUBINUR negative 05/01/2022 1013   BILIRUBINUR see comment 06/27/2014 1153   KETONESUR 20 (A) 03/23/2023 1106   PROTEINUR 100 (A) 03/23/2023 1106   UROBILINOGEN 0.2 05/01/2022 1013   NITRITE NEGATIVE 03/23/2023 1106   LEUKOCYTESUR SMALL (A) 03/23/2023 1106   LEUKOCYTESUR see comment 06/27/2014 1153   Sepsis Labs Recent Labs  Lab 03/23/23 0944 03/24/23 0632 03/25/23 0428  WBC 8.1 4.2 5.3   Microbiology Recent Results (from the past 240 hour(s))  SARS Coronavirus 2 by RT PCR (hospital order, performed in [facility] hospital lab) *cepheid single result test* Anterior Nasal Swab     Status: None   Collection Time: 03/23/23 11:06 AM   Specimen: Anterior Nasal Swab  Result Value Ref Range Status   SARS Coronavirus 2 by RT PCR NEGATIVE NEGATIVE Final    Comment: (NOTE) SARS-CoV-2 target nucleic acids are NOT DETECTED.  The SARS-CoV-2 RNA is generally detectable in upper and lower respiratory specimens during the acute phase of infection. The lowest concentration of SARS-CoV-2 viral copies this assay can detect is 250 copies / mL. A negative result does not preclude SARS-CoV-2 infection and should not be used as the sole basis for treatment or other patient management decisions.  A negative result may occur with improper specimen collection / handling, submission of specimen other than nasopharyngeal swab, presence of viral mutation(s) within the areas targeted by this assay, and inadequate number of viral copies (<250 copies / mL). A negative result must be combined with clinical observations, patient history, and epidemiological information.  Fact Sheet for Patients:   RoadLapTop.co.za  Fact Sheet for Healthcare  Providers: http://kim-miller.com/  This test is not yet approved or  cleared by the [place] FDA and has been authorized for detection and/or diagnosis of SARS-CoV-2 by FDA under an Emergency Use Authorization (EUA).  This EUA will remain in effect (meaning this test can be used) for the duration of the COVID-19 declaration under Section 564(b)(1) of the Act, 21 U.S.C. section 360bbb-3(b)(1), unless the authorization is terminated or revoked sooner.  Performed at [facility], [address] [place], [place] [address]   Urine Culture     Status: Abnormal   Collection Time: 03/23/23 11:06 AM   Specimen: Urine, Clean Catch  Result Value Ref Range Status   Specimen Description   Final    URINE, CLEAN CATCH Performed at [facility], [address], [place], [place] [address]    Special Requests  Final    NONE Performed at [facility], [address] [place], [place] [address]    Culture >=100,000 COLONIES/mL KLEBSIELLA PNEUMONIAE (A)  Final   Report Status 03/25/2023 FINAL  Final   Organism ID, Bacteria KLEBSIELLA PNEUMONIAE (A)  Final      Susceptibility   Klebsiella pneumoniae - MIC*    AMPICILLIN >=32 RESISTANT Resistant     CEFAZOLIN <=4 SENSITIVE Sensitive     CEFEPIME <=0.12 SENSITIVE Sensitive     CEFTRIAXONE <=0.25 SENSITIVE Sensitive     CIPROFLOXACIN 1 RESISTANT Resistant     GENTAMICIN <=1 SENSITIVE Sensitive     IMIPENEM 0.5 SENSITIVE Sensitive     NITROFURANTOIN 64 INTERMEDIATE Intermediate     TRIMETH/SULFA >=320 RESISTANT Resistant     AMPICILLIN/SULBACTAM 8 SENSITIVE Sensitive     PIP/TAZO <=4 SENSITIVE Sensitive     * >=100,000 COLONIES/mL KLEBSIELLA PNEUMONIAE  Blood culture (routine x 2)     Status: None (Preliminary result)   Collection Time: 03/23/23  3:39 PM   Specimen: BLOOD  Result Value Ref Range Status   Specimen Description BLOOD RIGHT ANTECUBITAL  Final   Special Requests   Final     BOTTLES DRAWN AEROBIC AND ANAEROBIC Blood Culture results may not be optimal due to an excessive volume of blood received in culture bottles   Culture   Final    NO GROWTH 2 DAYS Performed at [facility], [address], [place], [place] [address]    Report Status PENDING  Incomplete  Blood culture (routine x 2)     Status: None (Preliminary result)   Collection Time: 03/23/23  3:45 PM   Specimen: BLOOD  Result Value Ref Range Status   Specimen Description BLOOD BLOOD LEFT HAND  Final   Special Requests   Final    BOTTLES DRAWN AEROBIC AND ANAEROBIC Blood Culture adequate volume   Culture   Final    NO GROWTH 2 DAYS Performed at [facility], [address] [place], [place] [address]    Report Status PENDING  Incomplete     Total time spend on discharging this patient, including the last patient exam, discussing the hospital stay, instructions for ongoing care as it relates to all pertinent caregivers, as well as preparing the medical discharge records, prescriptions, and/or referrals as applicable, is 35 minutes.    Darlin Priestly, MD  [facility] 03/25/2023, 12:54 PM

## 2023-03-25 NOTE — Plan of Care (Signed)
Patient discharged per MD orders at this time.All dc instructions,education and medications reviewed with the patient.Pt expressed understanding and will comply with dc instructions.follow up appointments was also communicated to the patient.no verbal c/o or any ssx of distress.Pt was discharged home with self-care per order.Pt able to drive self and transported self home in a privately owned vehicle.

## 2023-03-26 LAB — CULTURE, BLOOD (ROUTINE X 2)

## 2023-03-27 LAB — CULTURE, BLOOD (ROUTINE X 2)

## 2023-03-28 LAB — CULTURE, BLOOD (ROUTINE X 2)
Culture: NO GROWTH
Culture: NO GROWTH

## 2023-04-01 ENCOUNTER — Ambulatory Visit (INDEPENDENT_AMBULATORY_CARE_PROVIDER_SITE_OTHER): Payer: 59

## 2023-04-01 VITALS — BP 128/82 | HR 92 | Ht 66.0 in | Wt 204.0 lb

## 2023-04-01 DIAGNOSIS — Z3042 Encounter for surveillance of injectable contraceptive: Secondary | ICD-10-CM | POA: Diagnosis not present

## 2023-04-01 MED ORDER — MEDROXYPROGESTERONE ACETATE 150 MG/ML IM SUSP
150.0000 mg | Freq: Once | INTRAMUSCULAR | Status: AC
Start: 2023-04-01 — End: 2023-04-01
  Administered 2023-04-01: 150 mg via INTRAMUSCULAR

## 2023-04-01 NOTE — Patient Instructions (Signed)
Contraceptive Injection A contraceptive injection is a shot that prevents pregnancy. It is also called a birth control shot. The shot contains the hormone progestin, which prevents pregnancy by: Stopping the ovaries from releasing eggs. Thickening cervical mucus to prevent sperm from entering the cervix. Thinning the lining of the uterus to prevent a fertilized egg from attaching to the uterus. Contraceptive injections are given under the skin (subcutaneous) or into a muscle (intramuscular). For these shots to work, you must get one of them every 3 months (12-13 weeks) from a health care provider. Tell a health care provider about: Any allergies you have. All medicines you are taking, including vitamins, herbs, eye drops, creams, and over-the-counter medicines. Any blood disorders you have. Any medical conditions you have. Whether you are pregnant or may be pregnant. What are the risks? Generally, this is a safe procedure. However, problems may occur, including: Mood changes or depression. Loss of bone density (osteoporosis) after long-term use. Blood clots. This is rare. Higher risk of an egg being fertilized outside your uterus (ectopic pregnancy).This is rare. What happens before the procedure? Your health care provider may do a routine physical exam. You may have a test to make sure you are not pregnant. What happens during the procedure?  The area where the shot will be given will be cleaned and sanitized with alcohol. A needle will be inserted into a muscle in your upper arm or buttock, or into the skin of your thigh or abdomen. The needle will be attached to a syringe with the medicine inside of it. The medicine will be pushed through the syringe and injected into your body. A small bandage (dressing) may be placed over the injection site. What can I expect after the procedure? After the procedure, it is common to have: Soreness around the injection site for a couple of  days. Irregular menstrual bleeding. Weight gain. Breast tenderness. Headaches. Discomfort in your abdomen. Ask your health care provider whether you need to use an added method of birth control (backup contraception), such as a condom, sponge, or spermicide. If the first shot is given 1-7 days after the start of your last menstrual period, you will not need backup contraception. If the first shot is given at any other time during your menstrual cycle, you should avoid having sex. If you do have sex, you will need to use backup contraception for 7 days after you receive the shot. Follow these instructions at home: General instructions Take over-the-counter and prescription medicines only as told by your health care provider. Do not rub or massage the injection site. Track your menstrual periods so you will know if they become irregular. Always use a condom to protect against sexually transmitted infections (STIs). Make sure you schedule an appointment in time for your next shot and mark it on your calendar. You must get an injection every 3 months (12-13 weeks) to prevent pregnancy. Lifestyle Do not use any products that contain nicotine or tobacco. These products include cigarettes, chewing tobacco, and vaping devices, such as e-cigarettes. If you need help quitting, ask your health care provider. Eat foods that are high in calcium and vitamin D, such as milk, cheese, and salmon. Doing this may help with any loss in bone density caused by the contraceptive injection. Ask your health care provider for dietary recommendations. Contact a health care provider if you: Have nausea or vomiting. Have abnormal vaginal discharge or bleeding. Miss a menstrual period or think you might be pregnant. Experience mood changes   or depression. Feel dizzy or light-headed. Have leg pain. Get help right away if you: Have chest pain or cough up blood. Have shortness of breath. Have a severe headache that does  not go away. Have numbness in any part of your body. Have slurred speech or vision problems. Have vaginal bleeding that is abnormally heavy or does not stop, or you have severe pain in your abdomen. Have depression that does not get better with treatment. If you ever feel like you may hurt yourself or others, or have thoughts about taking your own life, get help right away. Go to your nearest emergency department or: Call your local emergency services (911 in the [place] Call a suicide crisis helpline, such as the National Suicide Prevention Lifeline at 1-800-273-8255 or 988 in the [place] This is open 24 hours a day in the [place] Text the Crisis Text Line at 741741 (in the [place].). Summary A contraceptive injection is a shot that prevents pregnancy. It is also called the birth control shot. The shot is given under the skin (subcutaneous) or into a muscle (intramuscular). After this procedure, it is common to have soreness around the injection site for a couple of days. To prevent pregnancy, the shot must be given by a health care provider every 3 months (12-13 weeks). After you have the shot, ask your health care provider whether you need to use an added method of birth control (backup contraception), such as a condom, sponge, or spermicide. This information is not intended to replace advice given to you by your health care provider. Make sure you discuss any questions you have with your health care provider. Document Revised: 06/12/2021 Document Reviewed: 05/28/2020 Elsevier Patient Education  2023 Elsevier Inc.  

## 2023-04-01 NOTE — Progress Notes (Signed)
    NURSE VISIT NOTE  Subjective:    Patient ID: Sarah Holland, female    DOB: 1991/10/17, 32 y.o.   MRN: [id number]  HPI  Patient is a 32 y.o. [id number] female who presents for depo provera injection.   Objective:    BP 128/82   Pulse 92   Ht 5\' 6"  (1.676 m)   Wt 204 lb (92.5 kg)   BMI 32.93 kg/m   Last Annual: 05/01/22. Last pap: 05/01/22. Last Depo-Provera: 01/07/23. Side Effects if any: none. Serum HCG indicated? No . Depo-Provera 150 mg IM given by: Cornelius Moras, CMA. Site: Left Ventrogluteal    Assessment:   1. Encounter for surveillance of injectable contraceptive      Plan:   Next appointment due between July 17 and July 31,2024.    Cornelius Moras, CMA

## 2023-05-13 ENCOUNTER — Emergency Department
Admission: EM | Admit: 2023-05-13 | Discharge: 2023-05-14 | Disposition: A | Discharge status: Home / Self Care | Payer: 59 | Attending: Emergency Medicine | Admitting: Emergency Medicine

## 2023-05-13 ENCOUNTER — Other Ambulatory Visit: Payer: Self-pay

## 2023-05-13 DIAGNOSIS — Z20822 Contact with and (suspected) exposure to covid-19: Secondary | ICD-10-CM | POA: Diagnosis not present

## 2023-05-13 DIAGNOSIS — M545 Low back pain, unspecified: Secondary | ICD-10-CM | POA: Insufficient documentation

## 2023-05-13 DIAGNOSIS — N390 Urinary tract infection, site not specified: Secondary | ICD-10-CM | POA: Insufficient documentation

## 2023-05-13 DIAGNOSIS — R531 Weakness: Secondary | ICD-10-CM | POA: Insufficient documentation

## 2023-05-13 DIAGNOSIS — J020 Streptococcal pharyngitis: Secondary | ICD-10-CM | POA: Diagnosis not present

## 2023-05-13 LAB — CBC WITH DIFFERENTIAL/PLATELET
Abs Immature Granulocytes: 0.03 10*3/uL (ref 0.00–0.07)
Basophils Absolute: 0 10*3/uL (ref 0.0–0.1)
Basophils Relative: 0 %
Eosinophils Absolute: 0 10*3/uL (ref 0.0–0.5)
Eosinophils Relative: 0 %
HCT: 41.4 % (ref 36.0–46.0)
Hemoglobin: 13 g/dL (ref 12.0–15.0)
Immature Granulocytes: 0 %
Lymphocytes Relative: 10 %
Lymphs Abs: 0.8 10*3/uL (ref 0.7–4.0)
MCH: 25.1 pg — ABNORMAL LOW (ref 26.0–34.0)
MCHC: 31.4 g/dL (ref 30.0–36.0)
MCV: 80.1 fL (ref 80.0–100.0)
Monocytes Absolute: 0.6 10*3/uL (ref 0.1–1.0)
Monocytes Relative: 7 %
Neutro Abs: 7.2 10*3/uL (ref 1.7–7.7)
Neutrophils Relative %: 83 %
Platelets: 252 10*3/uL (ref 150–400)
RBC: 5.17 MIL/uL — ABNORMAL HIGH (ref 3.87–5.11)
RDW: 14.6 % (ref 11.5–15.5)
WBC: 8.7 10*3/uL (ref 4.0–10.5)
nRBC: 0 % (ref 0.0–0.2)

## 2023-05-13 LAB — COMPREHENSIVE METABOLIC PANEL
ALT: 24 U/L (ref 0–44)
AST: 21 U/L (ref 15–41)
Albumin: 4.9 g/dL (ref 3.5–5.0)
Alkaline Phosphatase: 42 U/L (ref 38–126)
Anion gap: 12 (ref 5–15)
BUN: 12 mg/dL (ref 6–20)
CO2: 21 mmol/L — ABNORMAL LOW (ref 22–32)
Calcium: 9.6 mg/dL (ref 8.9–10.3)
Chloride: 105 mmol/L (ref 98–111)
Creatinine, Ser: 0.87 mg/dL (ref 0.44–1.00)
GFR, Estimated: >60 mL/min (ref >60)
Glucose, Bld: 107 mg/dL — ABNORMAL HIGH (ref 70–99)
Potassium: 3.9 mmol/L (ref 3.5–5.1)
Sodium: 138 mmol/L (ref 135–145)
Total Bilirubin: 0.9 mg/dL (ref 0.3–1.2)
Total Protein: 8.4 g/dL — ABNORMAL HIGH (ref 6.5–8.1)

## 2023-05-13 LAB — URINALYSIS, ROUTINE W REFLEX MICROSCOPIC
Bilirubin Urine: NEGATIVE
Glucose, UA: NEGATIVE mg/dL
Hgb urine dipstick: NEGATIVE
Ketones, ur: NEGATIVE mg/dL
Leukocytes,Ua: NEGATIVE
Nitrite: POSITIVE — AB
Protein, ur: 30 mg/dL — AB
Specific Gravity, Urine: 1.027 (ref 1.005–1.030)
pH: 5 (ref 5.0–8.0)

## 2023-05-13 LAB — LACTIC ACID, PLASMA: Lactic Acid, Venous: 1.9 mmol/L (ref 0.5–1.9)

## 2023-05-13 LAB — GROUP A STREP BY PCR: Group A Strep by PCR: DETECTED — AB

## 2023-05-13 LAB — SARS CORONAVIRUS 2 BY RT PCR: SARS Coronavirus 2 by RT PCR: NEGATIVE

## 2023-05-13 LAB — POC URINE PREG, ED: Preg Test, Ur: NEGATIVE

## 2023-05-13 MED ORDER — ACETAMINOPHEN 500 MG PO TABS
1000.0000 mg | ORAL_TABLET | Freq: Once | ORAL | Status: AC
  Administered 2023-05-13: 1000 mg via ORAL
  Filled 2023-05-13: qty 2

## 2023-05-13 NOTE — ED Triage Notes (Signed)
Pt presents to ER with c/o generalized weakness, lower back pain that radiates down both legs (right worse than left), feeling lightheaded, and some slight lower abd pain.  No urinary symptoms, vomiting or diarrhea, but is feeling nauseous.  Pt reports back pain started 3-4 days ago, with other symptoms starting today.  Pt denies any cough at home, but states her throat was sore this morning.  Denies any sick contacts.  Pt is otherwise A&O x4 and in NAD in triage.

## 2023-05-14 MED ORDER — SODIUM CHLORIDE 0.9 % IV SOLN
1.0000 g | Freq: Once | INTRAVENOUS | Status: AC
  Administered 2023-05-14: 1 g via INTRAVENOUS
  Filled 2023-05-14: qty 10

## 2023-05-14 MED ORDER — MAGIC MOUTHWASH: ORAL | 0 refills | Status: DC

## 2023-05-14 MED ORDER — SODIUM CHLORIDE 0.9 % IV BOLUS
1000.0000 mL | Freq: Once | INTRAVENOUS | Status: AC
  Administered 2023-05-14: 1000 mL via INTRAVENOUS

## 2023-05-14 MED ORDER — ONDANSETRON HCL 4 MG/2ML IJ SOLN
4.0000 mg | Freq: Once | INTRAMUSCULAR | Status: AC
  Administered 2023-05-14: 4 mg via INTRAVENOUS
  Filled 2023-05-14: qty 2

## 2023-05-14 MED ORDER — DEXAMETHASONE SODIUM PHOSPHATE 10 MG/ML IJ SOLN
10.0000 mg | Freq: Once | INTRAMUSCULAR | Status: AC
  Administered 2023-05-14: 10 mg via INTRAVENOUS
  Filled 2023-05-14: qty 1

## 2023-05-14 MED ORDER — MAGIC MOUTHWASH
10.0000 mL | Freq: Once | ORAL | Status: AC
  Administered 2023-05-14: 10 mL via ORAL
  Filled 2023-05-14: qty 10

## 2023-05-14 MED ORDER — AMOXICILLIN-POT CLAVULANATE 875-125 MG PO TABS: 1.0000 | ORAL_TABLET | Freq: Two times a day (BID) | ORAL | 0 refills | Status: DC

## 2023-05-14 MED ORDER — ONDANSETRON 4 MG PO TBDP: 4.0000 mg | ORAL_TABLET | Freq: Three times a day (TID) | ORAL | 0 refills | Status: DC | PRN

## 2023-05-14 NOTE — Discharge Instructions (Signed)
1.  Take and finish antibiotic as prescribed (Augmentin 875 mg twice daily x 7 days). 2.  You may take Zofran as needed for nausea. 3.  You may take Magic mouthwash as needed for throat discomfort. 4.  Alternate Tylenol and ibuprofen every 4 hours as needed for fever greater than 100.4 F. 5.  Return to the ER for worsening symptoms, persistent vomiting, difficulty breathing or other concerns.

## 2023-05-14 NOTE — ED Provider Notes (Signed)
[facility] Provider Note    Event Date/Time   First MD Initiated Contact with Patient 05/14/23 0011     (approximate)   History   Weakness and Back Pain   HPI  Sarah Holland is a 32 y.o. female who presents to the ED from home with a 3 to 4-day history of generalized weakness, bilateral lower back pain, nausea, sore throat, fever and chills.  Denies cough, chest pain, abdominal pain, vomiting, dysuria or diarrhea.  Denies extremity weakness/numbness/tingling.  Denies bowel or bladder incontinence.     Past Medical History   Past Medical History:  Diagnosis Date   Elevated blood pressure complicating pregnancy in third trimester, antepartum 09/27/2019   Encounter for induction of labor 09/28/2019   Family history of pancreatic cancer    8/23 cancer genetic testing letter sent   Gestational diabetes    Gestational hypertension 09/28/2019   Obesity (BMI 30-39.9)    Obesity complicating peripregnancy, antepartum 08/09/2019   Supervision of high risk pregnancy, antepartum 04/05/2019   [facility] Prenatal Labs Dating LMP Blood type: B/Positive/-- (04/22 1503)  Genetic Screen NIPS: Negative XY Antibody:Negative (04/22 1503) Anatomic [place] Complete 05/16/2019 Rubella: 3.96 (04/22 1503) Varicella: Immune GTT Early:               Third trimester: 204; 3 hr 98/219/204/140- RPR: Non Reactive (04/22 1503)  Rhogam N/A HBsAg: Negative (04/22 1503)  TDaP vaccine        08/02/19                 Active Problem List   Patient Active Problem List   Diagnosis Date Noted   Pyelonephritis 03/24/2023   Acute pyelonephritis 03/23/2023   AKI (acute kidney injury) (HCC) 03/23/2023   Back pain 03/23/2023   Obesity (BMI 30-39.9) 08/30/2020   Normal vaginal delivery 09/28/2019   History of gestational diabetes 08/02/2019   SIRS (systemic inflammatory response syndrome) (HCC) 05/10/2015     Past Surgical History   Past Surgical History:  Procedure Laterality Date    APPENDECTOMY       Home Medications   Prior to Admission medications   Medication Sig Start Date End Date Taking? Authorizing Provider  amoxicillin-clavulanate (AUGMENTIN) 875-125 MG tablet Take 1 tablet by mouth 2 (two) times daily. 05/14/23  Yes Irean Hong, MD  magic mouthwash SOLN 12mL Anbesol 30mL Benadryl 30mL Mylanta  5mL swish, gargle & spit q8hr prn throat discomfort 05/14/23  Yes Irean Hong, MD  ondansetron (ZOFRAN-ODT) 4 MG disintegrating tablet Take 1 tablet (4 mg total) by mouth every 8 (eight) hours as needed for nausea or vomiting. 05/14/23  Yes Irean Hong, MD  medroxyPROGESTERone (DEPO-PROVERA) 150 MG/ML injection Inject 150 mg into the muscle every 3 (three) months.    [provider]  methocarbamol (ROBAXIN) 500 MG tablet Take 500 mg by mouth 4 (four) times daily as needed. Patient not taking: Reported on 04/01/2023 03/17/23   [provider]  predniSONE (DELTASONE) 20 MG tablet 1 TABLET BY MOUTH TWICE A DAY ON DAYS 1-4, THEN ONE DAILY DAYS 5-8. Hold this medication while on antibiotic Augmentin for your kidney infection.  Can start taking this again after finishing Augmentin if lower back pain still present. Patient not taking: Reported on 04/01/2023 03/25/23   Darlin Priestly, MD  SUMAtriptan (IMITREX) 100 MG tablet Take by mouth. 09/24/22 09/24/23  [provider]     Allergies  Patient has no known allergies.   Family  History   Family History  Problem Relation Age of Onset   Pancreatic cancer Father 59   Diabetes Maternal Grandmother    Pancreatic cancer Maternal Grandmother      Physical Exam  Triage Vital Signs: ED Triage Vitals  Enc Vitals Group     BP 05/13/23 2141 102/81     Pulse Rate 05/13/23 2141 (!) 120     Resp 05/13/23 2141 20     Temp 05/13/23 2141 (!) 102.1 F (38.9 C)     Temp Source 05/13/23 2141 Oral     SpO2 05/13/23 2141 100 %     Weight 05/13/23 2145 205 lb (93 kg)     Height 05/13/23 2145 5\' 6"  (1.676 m)      Head Circumference --      Peak Flow --      Pain Score 05/13/23 2144 10     Pain Loc --      Pain Edu? --      Excl. in GC? --     Updated Vital Signs: BP 108/63 (BP Location: Right Arm)   Pulse 95   Temp 99.5 F (37.5 C) (Oral)   Resp 20   Ht 5\' 6"  (1.676 m)   Wt 93 kg   SpO2 97%   BMI 33.09 kg/m    General: Awake, mild distress.  CV:  RRR.  Good peripheral perfusion.  Resp:  Normal effort.  CTAB. Abd:  Nontender.  No CVAT.  No truncal vesicles.  No distention.  Other:  Moderately erythematous posterior oropharynx with mild symmetrical tonsillar swelling and scattered exudates.  No peritonsillar abscess.  There is no hoarse or muffled voice.  There is no drooling.  Shotty anterior cervical lymphadenopathy.   ED Results / Procedures / Treatments  Labs (all labs ordered are listed, but only abnormal results are displayed) Labs Reviewed  GROUP A STREP BY PCR - Abnormal; Notable for the following components:      Result Value   Group A Strep by PCR DETECTED (*)    All other components within normal limits  CBC WITH DIFFERENTIAL/PLATELET - Abnormal; Notable for the following components:   RBC 5.17 (*)    MCH 25.1 (*)    All other components within normal limits  COMPREHENSIVE METABOLIC PANEL - Abnormal; Notable for the following components:   CO2 21 (*)    Glucose, Bld 107 (*)    Total Protein 8.4 (*)    All other components within normal limits  URINALYSIS, ROUTINE W REFLEX MICROSCOPIC - Abnormal; Notable for the following components:   Color, Urine YELLOW (*)    APPearance HAZY (*)    Protein, ur 30 (*)    Nitrite POSITIVE (*)    Bacteria, UA MANY (*)    All other components within normal limits  SARS CORONAVIRUS 2 BY RT PCR  URINE CULTURE  LACTIC ACID, PLASMA  POC URINE PREG, ED     EKG  None   RADIOLOGY None   Official radiology report(s): No results found.   PROCEDURES:  Critical Care performed: No  Procedures   MEDICATIONS ORDERED  IN ED: Medications  magic mouthwash (0 mLs Oral Hold 05/14/23 0048)  acetaminophen (TYLENOL) tablet 1,000 mg (1,000 mg Oral Given 05/13/23 2152)  sodium chloride 0.9 % bolus 1,000 mL (1,000 mLs Intravenous New Bag/Given 05/14/23 0102)  ondansetron (ZOFRAN) injection 4 mg (4 mg Intravenous Given 05/14/23 0049)  dexamethasone (DECADRON) injection 10 mg (10 mg Intravenous Given 05/14/23 0057)  cefTRIAXone (  ROCEPHIN) 1 g in sodium chloride 0.9 % 100 mL IVPB (0 g Intravenous Stopped 05/14/23 0137)     IMPRESSION / MDM / ASSESSMENT AND PLAN / ED COURSE  I reviewed the triage vital signs and the nursing notes.                             32 year old female presenting with fever, generalized weakness, back pain, sore throat.  Differential diagnosis includes but is not limited to viral process, strep pharyngitis, UTI, sepsis, etc.  Personally reviewed patient's records and note a PCP office visit on 04/15/2023 for pyelonephritis.  Patient's presentation is most consistent with acute presentation with potential threat to life or bodily function.  Which results demonstrate WBC 8.7, normal electrolytes, negative lactic, negative COVID-19.  Group A strep is positive as is nitrite positive UTI.  Will initiate IV fluid resuscitation, IV Rocephin.  Add IV Decadron for tonsillar swelling, Magic mouthwash for relief of throat pain and IV Zofran for nausea.  Will reassess.  Clinical Course as of 05/14/23 0208  Thu May 14, 2023  0206 Patient feeling significantly better.  IV fluids and antibiotics finished.  Will discharge home on antibiotics and patient will follow-up closely with her PCP.  Strict return precautions given.  Patient verbalizes understanding and agrees with plan of care. [JS]    Clinical Course User Index [JS] Irean Hong, MD     FINAL CLINICAL IMPRESSION(S) / ED DIAGNOSES   Final diagnoses:  Lower urinary tract infectious disease  Weakness  Strep throat     Rx / DC Orders   ED  Discharge Orders          Ordered    amoxicillin-clavulanate (AUGMENTIN) 875-125 MG tablet  2 times daily        05/14/23 0207    ondansetron (ZOFRAN-ODT) 4 MG disintegrating tablet  Every 8 hours PRN        05/14/23 0207    magic mouthwash SOLN        05/14/23 0207             Note:  This document was prepared using Dragon voice recognition software and may include unintentional dictation errors.   Irean Hong, MD 05/14/23 [phone]

## 2023-05-15 LAB — URINE CULTURE

## 2023-05-16 LAB — URINE CULTURE: Culture: >=100000 — AB

## 2023-05-21 DIAGNOSIS — N870 Mild cervical dysplasia: Secondary | ICD-10-CM | POA: Insufficient documentation

## 2023-05-21 NOTE — Progress Notes (Signed)
PCP:  Enid Baas, MD   Chief Complaint  Patient presents with   Gynecologic Exam     HPI:      [name] is a 32 y.o. [phone] whose LMP was No LMP recorded. Patient has had an injection., presents today for her annual examination.  Her menses are absent on depo. No BTB, mild dysmen occas  Sex activity: single partner, contraception - Depo-Provera injections.  Last Pap: 05/01/22 Results were: no abnormalities /neg HPV DNA ; repeat pap due 5/22 Colpo bx with CIN 1 with Dr. Bjorn Pippin  03/07/21 ASC-H/neg HPV DNA Hx of STDs: HPV  There is a FH of breast cancer in her mat grt aunt, genetic testing not indicated. There is no FH of ovarian cancer. The patient does self-breast exams.  Tobacco use: vapes daily, quit cigs Alcohol use: none No drug use.  Exercise: moderately active  She does not get adequate calcium and Vitamin D in her diet.  Pt developed severe LBP with nausea/fever/dizziness 4/24. No urin sx. Went to ED and diagnosed with pyelonephritis; C&S showed klebsiella. Treated inpatient for a few days. Neg CT for stones. Sx resolved. Pt developed LBP again last wk and C&S showed Klebsiella UTI again, no other urin sx. Has to miss work due to severe LBP. Treated and sx resolved. Pt concerned about new frequent UTIs. Not prone to UTIs. Sex not a trigger. Drinks a lot of water daily.    Patient Active Problem List   Diagnosis Date Noted   Dysplasia of cervix, low grade (CIN 1) 05/21/2023   Pyelonephritis 03/24/2023   Acute pyelonephritis 03/23/2023   AKI (acute kidney injury) (HCC) 03/23/2023   Back pain 03/23/2023   Obesity (BMI 30-39.9) 08/30/2020   Normal vaginal delivery 09/28/2019   History of gestational diabetes 08/02/2019   SIRS (systemic inflammatory response syndrome) (HCC) 05/10/2015    Past Surgical History:  Procedure Laterality Date   APPENDECTOMY      Family History  Problem Relation Age of Onset   Liver cancer Father 32   Diabetes  Maternal Grandmother    Pancreatic cancer Maternal Grandmother     Social History   Socioeconomic History   Marital status: Married    Spouse name: Not on file   Number of children: 3   Years of education: Not on file   Highest education level: Not on file  Occupational History   Not on file  Tobacco Use   Smoking status: Former    Packs/day: 0.25    Years: 3.00    Additional pack years: 0.00    Total pack years: 0.75    Types: Cigarettes    Quit date: 12/01/2017    Years since quitting: 5.4   Smokeless tobacco: Never  Vaping Use   Vaping Use: Never used  Substance and Sexual Activity   Alcohol use: No   Drug use: Never   Sexual activity: Yes    Birth control/protection: Injection  Other Topics Concern   Not on file  Social History Narrative   Not on file   Social Determinants of Health   Financial Resource Strain: Not on file  Food Insecurity: No Food Insecurity (03/23/2023)   Hunger Vital Sign    Worried About Running Out of Food in the Last Year: Never true    Ran Out of Food in the Last Year: Never true  Transportation Needs: No Transportation Needs (03/23/2023)   PRAPARE - Administrator, Civil Service (Medical): No  Lack of Transportation (Non-Medical): No  Physical Activity: Not on file  Stress: Not on file  Social Connections: Not on file  Intimate Partner Violence: Not At Risk (03/23/2023)   Humiliation, Afraid, Rape, and Kick questionnaire    Fear of Current or Ex-Partner: No    Emotionally Abused: No    Physically Abused: No    Sexually Abused: No     Current Outpatient Medications:    celecoxib (CELEBREX) 200 MG capsule, Take by mouth., Disp: , Rfl:    cyclobenzaprine (FLEXERIL) 10 MG tablet, Take by mouth., Disp: , Rfl:    ondansetron (ZOFRAN-ODT) 4 MG disintegrating tablet, Take 1 tablet (4 mg total) by mouth every 8 (eight) hours as needed for nausea or vomiting., Disp: 20 tablet, Rfl: 0   predniSONE (DELTASONE) 20 MG tablet, 1  TABLET BY MOUTH TWICE A DAY ON DAYS 1-4, THEN ONE DAILY DAYS 5-8. Hold this medication while on antibiotic Augmentin for your kidney infection.  Can start taking this again after finishing Augmentin if lower back pain still present., Disp: , Rfl:    SUMAtriptan (IMITREX) 100 MG tablet, Take by mouth., Disp: , Rfl:   Current Facility-Administered Medications:    [START ON 06/02/2023] medroxyPROGESTERone (DEPO-PROVERA) injection 150 mg, 150 mg, Intramuscular, Q90 days, Mikael Skoda B, PA-C     ROS:  Review of Systems  Constitutional:  Negative for fatigue, fever and unexpected weight change.  Respiratory:  Negative for cough, shortness of breath and wheezing.   Cardiovascular:  Negative for chest pain, palpitations and leg swelling.  Gastrointestinal:  Negative for blood in stool, constipation, diarrhea, nausea and vomiting.  Endocrine: Negative for cold intolerance, heat intolerance and polyuria.  Genitourinary:  Positive for frequency. Negative for dyspareunia, dysuria, flank pain, genital sores, hematuria, menstrual problem, pelvic pain, urgency, vaginal bleeding, vaginal discharge and vaginal pain.  Musculoskeletal:  Negative for back pain, joint swelling and myalgias.  Skin:  Negative for rash.  Neurological:  Negative for dizziness, syncope, light-headedness, numbness and headaches.  Hematological:  Negative for adenopathy.  Psychiatric/Behavioral:  Negative for agitation, confusion, sleep disturbance and suicidal ideas. The patient is not nervous/anxious.    BREAST: No symptoms   Objective: BP 120/86   Ht 5\' 6"  (1.676 m)   Wt 197 lb (89.4 kg)   BMI 31.80 kg/m    Physical Exam Constitutional:      Appearance: She is well-developed.  Genitourinary:     Vulva normal.     Right Labia: No rash, tenderness or lesions.    Left Labia: No tenderness, lesions or rash.    No vaginal discharge, erythema or tenderness.      Right Adnexa: not tender and no mass present.    Left  Adnexa: not tender and no mass present.    No cervical friability or polyp.     Uterus is not enlarged or tender.  Breasts:    Right: No mass, nipple discharge, skin change or tenderness.     Left: No mass, nipple discharge, skin change or tenderness.  Neck:     Thyroid: No thyromegaly.  Cardiovascular:     Rate and Rhythm: Normal rate and regular rhythm.     Heart sounds: Normal heart sounds. No murmur heard. Pulmonary:     Effort: Pulmonary effort is normal.     Breath sounds: Normal breath sounds.  Abdominal:     Palpations: Abdomen is soft.     Tenderness: There is no abdominal tenderness. There is no guarding or rebound.  Musculoskeletal:        General: Normal range of motion.     Cervical back: Normal range of motion.  Lymphadenopathy:     Cervical: No cervical adenopathy.  Neurological:     General: No focal deficit present.     Mental Status: She is alert and oriented to person, place, and time.     Cranial Nerves: No cranial nerve deficit.  Skin:    General: Skin is warm and dry.  Psychiatric:        Mood and Affect: Mood normal.        Behavior: Behavior normal.        Thought Content: Thought content normal.        Judgment: Judgment normal.  Vitals reviewed.     Assessment/Plan: Encounter for annual routine gynecological examination  Cervical cancer screening - Plan: Cytology - PAP  Screening for HPV (human papillomavirus) - Plan: Cytology - PAP  Dysplasia of cervix, low grade (CIN 1) - Plan: Cytology - PAP; repeat today, will f/u with results.   Encounter for surveillance of injectable contraceptive - Plan: medroxyPROGESTERone (DEPO-PROVERA) injection 150 mg; Rx RF. Add ca/Vit D.   Recurrent UTI - Plan: Ambulatory referral to Urology; refer to urology given new onset UTIs with pyelonephritis 4/24.    Meds ordered this encounter  Medications   medroxyPROGESTERone (DEPO-PROVERA) injection 150 mg             GYN counsel adequate intake of calcium and  vitamin D, diet and exercise     F/U  Return in about 1 year (around 05/24/2024).  Jhovani Griswold B. Kelin Nixon, PA-C 05/25/2023 11:08 AM

## 2023-05-25 ENCOUNTER — Other Ambulatory Visit (HOSPITAL_COMMUNITY)
Admission: RE | Admit: 2023-05-25 | Discharge: 2023-05-25 | Disposition: A | Discharge status: Home / Self Care | Payer: 59 | Source: Ambulatory Visit | Attending: Obstetrics and Gynecology | Admitting: Obstetrics and Gynecology

## 2023-05-25 ENCOUNTER — Ambulatory Visit (INDEPENDENT_AMBULATORY_CARE_PROVIDER_SITE_OTHER): Payer: 59 | Admitting: Obstetrics and Gynecology

## 2023-05-25 ENCOUNTER — Encounter: Payer: Self-pay | Admitting: Obstetrics and Gynecology

## 2023-05-25 VITALS — BP 120/86 | Ht 66.0 in | Wt 197.0 lb

## 2023-05-25 DIAGNOSIS — Z1151 Encounter for screening for human papillomavirus (HPV): Secondary | ICD-10-CM | POA: Diagnosis present

## 2023-05-25 DIAGNOSIS — Z01419 Encounter for gynecological examination (general) (routine) without abnormal findings: Secondary | ICD-10-CM | POA: Diagnosis not present

## 2023-05-25 DIAGNOSIS — N870 Mild cervical dysplasia: Secondary | ICD-10-CM

## 2023-05-25 DIAGNOSIS — Z124 Encounter for screening for malignant neoplasm of cervix: Secondary | ICD-10-CM

## 2023-05-25 DIAGNOSIS — N390 Urinary tract infection, site not specified: Secondary | ICD-10-CM

## 2023-05-25 DIAGNOSIS — Z3042 Encounter for surveillance of injectable contraceptive: Secondary | ICD-10-CM

## 2023-05-25 MED ORDER — MEDROXYPROGESTERONE ACETATE 150 MG/ML IM SUSP
150.0000 mg | INTRAMUSCULAR | Status: AC
Start: 2023-06-02 — End: 2024-05-26

## 2023-05-25 NOTE — Patient Instructions (Signed)
I value your feedback and you entrusting us with your care. If you get a [facility] patient survey, I would appreciate you taking the time to let us know about your experience today. Thank you! ? ? ?

## 2023-05-27 LAB — CYTOLOGY - PAP
Comment: NEGATIVE
Diagnosis: NEGATIVE
High risk HPV: NEGATIVE

## 2023-06-24 ENCOUNTER — Ambulatory Visit (INDEPENDENT_AMBULATORY_CARE_PROVIDER_SITE_OTHER): Payer: 59

## 2023-06-24 VITALS — BP 118/83 | HR 88 | Ht 65.0 in | Wt 200.0 lb

## 2023-06-24 DIAGNOSIS — Z3042 Encounter for surveillance of injectable contraceptive: Secondary | ICD-10-CM | POA: Diagnosis not present

## 2023-06-24 MED ORDER — MEDROXYPROGESTERONE ACETATE 150 MG/ML IM SUSP
150.0000 mg | Freq: Once | INTRAMUSCULAR | Status: AC
Start: 2023-06-24 — End: 2023-06-24
  Administered 2023-06-24: 150 mg via INTRAMUSCULAR

## 2023-06-24 NOTE — Progress Notes (Signed)
    NURSE VISIT NOTE  Subjective:    Patient ID: Sarah Holland, female    DOB: [birth year], 32 y.o.   MRN: [id number]  HPI  Patient is a 32 y.o. [id number] female who presents for depo provera injection.   Objective:    BP 118/83   Pulse 88   Ht 5\' 5"  (1.651 m)   Wt 200 lb (90.7 kg)   BMI 33.28 kg/m   Last Annual: 05/25/23 Last pap: 05/01/22. Last Depo-Provera: 04/01/23. Side Effects if any: none. Serum HCG indicated? No . Depo-Provera 150 mg IM given by: Cornelius Moras, CMA. Site: Right Ventrogluteal  Lab Review  @THIS  VISIT ONLY@  Assessment:   1. Surveillance for Depo-Provera contraception      Plan:   Next appointment due between Sep 9 - Oct 7,2024.    Cornelius Moras, CMA

## 2023-06-24 NOTE — Patient Instructions (Signed)
Contraceptive Injection A contraceptive injection is a shot that prevents pregnancy. It is also called a birth control shot. The shot contains the hormone progestin, which prevents pregnancy by: Stopping the ovaries from releasing eggs. Thickening cervical mucus to prevent sperm from entering the cervix. Thinning the lining of the uterus to prevent a fertilized egg from attaching to the uterus. Contraceptive injections are given under the skin (subcutaneous) or into a muscle (intramuscular). For these shots to work, you must get one of them every 3 months (12-13 weeks) from a health care provider. Tell a health care provider about: Any allergies you have. All medicines you are taking, including vitamins, herbs, eye drops, creams, and over-the-counter medicines. Any blood disorders you have. Any medical conditions you have. Whether you are pregnant or may be pregnant. What are the risks? Generally, this is a safe procedure. However, problems may occur, including: Mood changes or depression. Loss of bone density (osteoporosis) after long-term use. Blood clots. This is rare. Higher risk of an egg being fertilized outside your uterus (ectopic pregnancy).This is rare. What happens before the procedure? Your health care provider may do a routine physical exam. You may have a test to make sure you are not pregnant. What happens during the procedure?  The area where the shot will be given will be cleaned and sanitized with alcohol. A needle will be inserted into a muscle in your upper arm or buttock, or into the skin of your thigh or abdomen. The needle will be attached to a syringe with the medicine inside of it. The medicine will be pushed through the syringe and injected into your body. A small bandage (dressing) may be placed over the injection site. What can I expect after the procedure? After the procedure, it is common to have: Soreness around the injection site for a couple of  days. Irregular menstrual bleeding. Weight gain. Breast tenderness. Headaches. Discomfort in your abdomen. Ask your health care provider whether you need to use an added method of birth control (backup contraception), such as a condom, sponge, or spermicide. If the first shot is given 1-7 days after the start of your last menstrual period, you will not need backup contraception. If the first shot is given at any other time during your menstrual cycle, you should avoid having sex. If you do have sex, you will need to use backup contraception for 7 days after you receive the shot. Follow these instructions at home: General instructions Take over-the-counter and prescription medicines only as told by your health care provider. Do not rub or massage the injection site. Track your menstrual periods so you will know if they become irregular. Always use a condom to protect against sexually transmitted infections (STIs). Make sure you schedule an appointment in time for your next shot and mark it on your calendar. You must get an injection every 3 months (12-13 weeks) to prevent pregnancy. Lifestyle Do not use any products that contain nicotine or tobacco. These products include cigarettes, chewing tobacco, and vaping devices, such as e-cigarettes. If you need help quitting, ask your health care provider. Eat foods that are high in calcium and vitamin D, such as milk, cheese, and salmon. Doing this may help with any loss in bone density caused by the contraceptive injection. Ask your health care provider for dietary recommendations. Contact a health care provider if you: Have nausea or vomiting. Have abnormal vaginal discharge or bleeding. Miss a menstrual period or think you might be pregnant. Experience mood changes   or depression. Feel dizzy or light-headed. Have leg pain. Get help right away if you: Have chest pain or cough up blood. Have shortness of breath. Have a severe headache that does  not go away. Have numbness in any part of your body. Have slurred speech or vision problems. Have vaginal bleeding that is abnormally heavy or does not stop, or you have severe pain in your abdomen. Have depression that does not get better with treatment. If you ever feel like you may hurt yourself or others, or have thoughts about taking your own life, get help right away. Go to your nearest emergency department or: Call your local emergency services (911 in the [place] Call a suicide crisis helpline, such as the National Suicide Prevention Lifeline at 1-800-273-8255 or 988 in the [place] This is open 24 hours a day in the [place] Text the Crisis Text Line at 741741 (in the [place] Summary A contraceptive injection is a shot that prevents pregnancy. It is also called the birth control shot. The shot is given under the skin (subcutaneous) or into a muscle (intramuscular). After this procedure, it is common to have soreness around the injection site for a couple of days. To prevent pregnancy, the shot must be given by a health care provider every 3 months (12-13 weeks). After you have the shot, ask your health care provider whether you need to use an added method of birth control (backup contraception), such as a condom, sponge, or spermicide. This information is not intended to replace advice given to you by your health care provider. Make sure you discuss any questions you have with your health care provider. Document Revised: 06/12/2021 Document Reviewed: 05/28/2020 Elsevier Patient Education  2024 [employer]  

## 2023-07-01 ENCOUNTER — Ambulatory Visit (INDEPENDENT_AMBULATORY_CARE_PROVIDER_SITE_OTHER): Payer: 59 | Admitting: Urology

## 2023-07-01 VITALS — BP 128/86 | HR 99 | Ht 65.0 in | Wt 200.0 lb

## 2023-07-01 DIAGNOSIS — Z09 Encounter for follow-up examination after completed treatment for conditions other than malignant neoplasm: Secondary | ICD-10-CM | POA: Diagnosis not present

## 2023-07-01 DIAGNOSIS — Z87448 Personal history of other diseases of urinary system: Secondary | ICD-10-CM | POA: Diagnosis not present

## 2023-07-01 DIAGNOSIS — N390 Urinary tract infection, site not specified: Secondary | ICD-10-CM

## 2023-07-01 LAB — URINALYSIS, COMPLETE
Bilirubin, UA: NEGATIVE
Glucose, UA: NEGATIVE
Ketones, UA: NEGATIVE
Nitrite, UA: NEGATIVE
Protein,UA: NEGATIVE
RBC, UA: NEGATIVE
Specific Gravity, UA: 1.015 (ref 1.005–1.030)
Urobilinogen, Ur: 0.2 mg/dL (ref 0.2–1.0)
pH, UA: 6 (ref 5.0–7.5)

## 2023-07-01 LAB — MICROSCOPIC EXAMINATION: Epithelial Cells (non renal): >10 /hpf — AB (ref 0–10)

## 2023-07-01 LAB — BLADDER SCAN AMB NON-IMAGING: Scan Result: 0

## 2023-07-01 NOTE — Progress Notes (Signed)
Marcelle Overlie Plume,acting as a scribe for Vanna Scotland, MD.,have documented all relevant documentation on the behalf of Vanna Scotland, MD,as directed by  Vanna Scotland, MD while in the presence of Vanna Scotland, MD.  07/01/2023 9:07 AM   Hart Rochester [birth year] [id number]  Referring provider: Rica Records, PA-C [address] [place],  [place] [address]  Chief Complaint  Patient presents with   Establish Care   Recurrent UTI    HPI: 32 year-old female who presents today for further evaluation of urinary tract infections.   She was seen and evaluated on 03/23/2023 with 1 week of lower back pain initially thought was related to sciatica but she was also having cold sweats and urinary urgency. In the emergency room, she had a positive urinalysis and radiographic evidence of right pyelonephritis. She ended up being admitted for presumed pyelonephritis. She was discharged 2 days later after growing klebsiella. She got 3 days of ceftriaxone and was discharged with 4 more days of Augmentin.   She ended up returning to the emergency room on 05/14/2023, this time with nonspecific symptoms, generalized weakness, lower back pain, nausea, sore throat, and chills, but not urinary symptoms. She was however, febrile to 102 F and also had peritonsillar exudates. She was diagnosed with strep throat, and prescribed Augmentin, but as part of that workup,they also checked a urinalysis that actually was pretty unremarkable. It was nitrate positive and many bacteria, but it had no WBC or RBC. However, the urine culture did grow Klebsiella again.   She followed up with her PCP and had a negative urinalysis.   Today, she reports no urinary symptoms. She notes that she has been using Depo-Provera for birth control since 2020 after she had her son.   No urinary symptoms today.  She has no previous history of UTIs or pyelonephritis.  Results for orders placed or performed in visit on 07/01/23   Bladder Scan (Post Void Residual) in office  Result Value Ref Range   Scan Result 0      PMH: Past Medical History:  Diagnosis Date   Elevated blood pressure complicating pregnancy in third trimester, antepartum 09/27/2019   Encounter for induction of labor 09/28/2019   Family history of pancreatic cancer    8/23 cancer genetic testing letter sent   Gestational diabetes    Gestational hypertension 09/28/2019   Obesity (BMI 30-39.9)    Obesity complicating peripregnancy, antepartum 08/09/2019   Supervision of high risk pregnancy, antepartum 04/05/2019   [facility] Prenatal Labs Dating LMP Blood type: B/Positive/-- (04/22 1503)  Genetic Screen NIPS: Negative XY Antibody:Negative (04/22 1503) Anatomic [place] Complete 05/16/2019 Rubella: 3.96 (04/22 1503) Varicella: Immune GTT Early:               Third trimester: 204; 3 hr 98/219/204/140- RPR: Non Reactive (04/22 1503)  Rhogam N/A HBsAg: Negative (04/22 1503)  TDaP vaccine        08/02/19                Surgical History: Past Surgical History:  Procedure Laterality Date   APPENDECTOMY      Home Medications:  Allergies as of 07/01/2023   No Known Allergies      Medication List        Accurate as of July 01, 2023  9:07 AM. If you have any questions, ask your nurse or doctor.          medroxyPROGESTERone 150 MG/ML injection Commonly known as: DEPO-PROVERA Inject 150  mg into the muscle every 3 (three) months.   ondansetron 4 MG disintegrating tablet Commonly known as: ZOFRAN-ODT Take 1 tablet (4 mg total) by mouth every 8 (eight) hours as needed for nausea or vomiting.   predniSONE 20 MG tablet Commonly known as: DELTASONE 1 TABLET BY MOUTH TWICE A DAY ON DAYS 1-4, THEN ONE DAILY DAYS 5-8. Hold this medication while on antibiotic Augmentin for your kidney infection.  Can start taking this again after finishing Augmentin if lower back pain still present.   SUMAtriptan 100 MG tablet Commonly known as: IMITREX Take by  mouth.        Family History: Family History  Problem Relation Age of Onset   Liver cancer Father 87   Diabetes Maternal Grandmother    Pancreatic cancer Maternal Grandmother     Social History:  reports that she quit smoking about 5 years ago. Her smoking use included cigarettes. She started smoking about 8 years ago. She has a 0.8 pack-year smoking history. She has never used smokeless tobacco. She reports that she does not drink alcohol and does not use drugs.   Physical Exam: BP 128/86   Pulse 99   Ht 5\' 5"  (1.651 m)   Wt 200 lb (90.7 kg)   BMI 33.28 kg/m   Constitutional:  Alert and oriented, No acute distress. HEENT: [place] AT, moist mucus membranes.  Trachea midline, no masses. Neurologic: Grossly intact, no focal deficits, moving all 4 extremities. Psychiatric: Normal mood and affect.  CT scan IMPRESSION: There is patchy inhomogeneous cortical enhancement in right kidney suggesting acute pyelonephritis. There is no hydronephrosis. There is no loculated perinephric fluid collection.  There is no evidence of intestinal obstruction.  Other findings as described in the body of the report.   Electronically Signed By: Ernie Avena M.D. On: 03/23/2023 13:59   Personally reviewed the above CT scan and agree with radiologic interpretation.  Assessment & Plan:    1. History of pyelonephritis - She had one documented incident of true pyelonephritis. On her second one, her urinalysis was fairly unremarkable and she did not have specific urinary tract symptoms. As such, I think that this was probably contamination or colonization. - She is asymptomatic today. She does not have a history prior to this isolated episode - Her CT scan showed normal structure of the kidneys, which is reassuring. She is emptying her bladder today. - We discussed hygiene and prevention - She is on Depo-Provera which may be a risk factor. - If she has any signs or symptoms of infection, she  should return to discuss additional workup as well as possible prevention which may include topical estrogen versus cranberry tablets versus probiotics etc. - Her urinalysis today shows a slightly contaminated sample but no signs of infection  Return if symptoms worsen or fail to improve.  I have reviewed the above documentation for accuracy and completeness, and I agree with the above.   Vanna Scotland, MD    [facility] [address] [place], [place] [address] [phone]

## 2023-09-17 ENCOUNTER — Ambulatory Visit: Payer: 59

## 2023-09-17 VITALS — BP 124/85 | HR 87 | Ht 65.0 in | Wt 190.6 lb

## 2023-09-17 DIAGNOSIS — Z3042 Encounter for surveillance of injectable contraceptive: Secondary | ICD-10-CM | POA: Diagnosis not present

## 2023-09-17 MED ORDER — MEDROXYPROGESTERONE ACETATE 150 MG/ML IM SUSP
150.0000 mg | Freq: Once | INTRAMUSCULAR | Status: AC
Start: 2023-09-17 — End: 2023-09-17
  Administered 2023-09-17: 150 mg via INTRAMUSCULAR

## 2023-09-17 NOTE — Patient Instructions (Signed)

## 2023-09-17 NOTE — Progress Notes (Signed)
NURSE VISIT NOTE  Subjective:    Patient ID: DEVANNA CRUMBLISS, female    DOB: May 15, 1991, 32 y.o.   MRN: [id number]  HPI  Patient is a 32 y.o. [id number] female who presents for depo provera injection.   Objective:    BP 124/85   Pulse 87   Ht 5\' 5"  (1.651 m)   Wt 190 lb 9.6 oz (86.5 kg)   BMI 31.72 kg/m   Last Annual: 05/25/23. Last pap: 05/01/22. Last Depo-Provera: 06/24/23. Side Effects if any: none. Serum HCG indicated? No. Depo-Provera 150 mg IM given by: Sheliah Hatch, CMA. Site: Left Ventrogluteal  Lab Review    Assessment:   1. Surveillance for Depo-Provera contraception      Plan:   Next appointment due between 12/03/23 and 12/17/23.    Fonda Kinder, CMA

## 2023-12-10 ENCOUNTER — Ambulatory Visit: Payer: 59

## 2023-12-11 ENCOUNTER — Ambulatory Visit (INDEPENDENT_AMBULATORY_CARE_PROVIDER_SITE_OTHER): Payer: 59

## 2023-12-11 VITALS — BP 127/94 | HR 110 | Wt 192.6 lb

## 2023-12-11 DIAGNOSIS — Z3042 Encounter for surveillance of injectable contraceptive: Secondary | ICD-10-CM

## 2023-12-11 MED ORDER — MEDROXYPROGESTERONE ACETATE 150 MG/ML IM SUSY
150.0000 mg | PREFILLED_SYRINGE | Freq: Once | INTRAMUSCULAR | Status: AC
Start: 2023-12-11 — End: 2023-12-11
  Administered 2023-12-11: 150 mg via INTRAMUSCULAR

## 2023-12-11 NOTE — Progress Notes (Signed)
    NURSE VISIT NOTE  Subjective:    Patient ID: Sarah Holland, female    DOB: [birth year], 33 y.o.   MRN: [id number]  HPI  Patient is a 33 y.o. H2E6956 female who presents for depo provera  injection.   Objective:    BP (!) 127/94 (BP Location: Left Arm, Patient Position: Sitting, Cuff Size: Normal)   Pulse (!) 110   Wt 192 lb 9.6 oz (87.4 kg)   BMI 32.05 kg/m   Last Annual: 05/25/2023. Last pap: 05/25/2023. Last Depo-Provera : 09/17/2023. Side Effects if any: none. Serum HCG indicated? No Depo-Provera  150 mg IM given by: Burnard Ro, CMA. Site: Left Upper Outer Quandrant  Lab Review  @THIS  VISIT ONLY@  Assessment:   1. Surveillance for Depo-Provera  contraception      Plan:   Next appointment due between March 28th and April 11th.    Burnard LITTIE Ro, CMA

## 2024-03-04 ENCOUNTER — Ambulatory Visit: Payer: Self-pay

## 2024-03-04 VITALS — BP 124/87 | HR 80 | Ht 65.0 in | Wt 206.0 lb

## 2024-03-04 DIAGNOSIS — Z3042 Encounter for surveillance of injectable contraceptive: Secondary | ICD-10-CM

## 2024-03-04 MED ORDER — MEDROXYPROGESTERONE ACETATE 150 MG/ML IM SUSY
150.0000 mg | PREFILLED_SYRINGE | Freq: Once | INTRAMUSCULAR | Status: AC
Start: 2024-03-04 — End: 2024-03-04
  Administered 2024-03-04: 150 mg via INTRAMUSCULAR

## 2024-03-04 NOTE — Progress Notes (Signed)
    NURSE VISIT NOTE  Subjective:    Patient ID: Sarah Holland, female    DOB: [birth year], 33 y.o.   MRN: [id number]  HPI  Patient is a 33 y.o. L2G4010 female who presents for depo provera injection.   Objective:    Ht 5\' 5"  (1.651 m)   Wt 206 lb (93.4 kg)   BMI 34.28 kg/m   Last Annual: 05/25/23. Last pap: 05/25/23. Last Depo-Provera: 12/11/2023. Side Effects if any: none. Serum HCG indicated? No . Depo-Provera 150 mg IM given by: Beverely Pace, CMA. Site: Left Ventrogluteal   Assessment:   1. Surveillance for Depo-Provera contraception      Plan:   Next appointment due between JUN20- JUL4      Loney Laurence, CMA

## 2024-05-27 ENCOUNTER — Ambulatory Visit (INDEPENDENT_AMBULATORY_CARE_PROVIDER_SITE_OTHER)

## 2024-05-27 VITALS — BP 124/87 | HR 88 | Resp 16 | Ht 65.0 in | Wt 212.7 lb

## 2024-05-27 DIAGNOSIS — Z3042 Encounter for surveillance of injectable contraceptive: Secondary | ICD-10-CM | POA: Diagnosis not present

## 2024-05-27 MED ORDER — MEDROXYPROGESTERONE ACETATE 150 MG/ML IM SUSP
150.0000 mg | Freq: Once | INTRAMUSCULAR | Status: AC
Start: 2024-05-27 — End: 2024-05-27
  Administered 2024-05-27: 150 mg via INTRAMUSCULAR

## 2024-05-27 NOTE — Patient Instructions (Signed)

## 2024-05-27 NOTE — Progress Notes (Signed)
    NURSE VISIT NOTE  Subjective:    Patient ID: Sarah Holland, female    DOB: 26-Apr-1991, 33 y.o.   MRN: [id number]  HPI  Patient is a 33 y.o. H2E6956 female who presents for depo provera  injection.   Objective:    BP 124/87   Pulse 88   Resp 16   Ht 5' 5 (1.651 m)   Wt 212 lb 11.2 oz (96.5 kg)   BMI 35.40 kg/m   Last Annual: 05/25/2023. Last pap: 05/25/2023. Last Depo-Provera : 03/04/2024. Side Effects if any: none. Serum HCG indicated? No . Depo-Provera  150 mg IM given by: Camelia Fetters, CMA. Site: Right Upper Outer Quandrant  Lab Review  No results found for any visits on 05/27/24.  Assessment:   1. Surveillance for Depo-Provera  contraception      Plan:   Next appointment due between Sept 12 and Sept. 27.     Camelia Fetters, CMA [facility]

## 2024-06-07 ENCOUNTER — Observation Stay (HOSPITAL_BASED_OUTPATIENT_CLINIC_OR_DEPARTMENT_OTHER)

## 2024-06-07 ENCOUNTER — Observation Stay (HOSPITAL_COMMUNITY)
Admission: EM | Admit: 2024-06-07 | Discharge: 2024-06-09 | Disposition: A | Discharge status: Home / Self Care | Attending: Internal Medicine | Admitting: Internal Medicine

## 2024-06-07 ENCOUNTER — Observation Stay (HOSPITAL_COMMUNITY)

## 2024-06-07 ENCOUNTER — Other Ambulatory Visit: Payer: Self-pay

## 2024-06-07 ENCOUNTER — Emergency Department (HOSPITAL_COMMUNITY)

## 2024-06-07 ENCOUNTER — Encounter (HOSPITAL_COMMUNITY): Payer: Self-pay

## 2024-06-07 DIAGNOSIS — I63512 Cerebral infarction due to unspecified occlusion or stenosis of left middle cerebral artery: Secondary | ICD-10-CM | POA: Diagnosis not present

## 2024-06-07 DIAGNOSIS — R29701 NIHSS score 1: Secondary | ICD-10-CM

## 2024-06-07 DIAGNOSIS — I639 Cerebral infarction, unspecified: Principal | ICD-10-CM | POA: Diagnosis present

## 2024-06-07 DIAGNOSIS — R531 Weakness: Secondary | ICD-10-CM | POA: Diagnosis present

## 2024-06-07 DIAGNOSIS — I6389 Other cerebral infarction: Secondary | ICD-10-CM

## 2024-06-07 DIAGNOSIS — E785 Hyperlipidemia, unspecified: Secondary | ICD-10-CM | POA: Insufficient documentation

## 2024-06-07 DIAGNOSIS — I10 Essential (primary) hypertension: Secondary | ICD-10-CM | POA: Diagnosis not present

## 2024-06-07 DIAGNOSIS — G43909 Migraine, unspecified, not intractable, without status migrainosus: Secondary | ICD-10-CM | POA: Insufficient documentation

## 2024-06-07 DIAGNOSIS — Q2112 Patent foramen ovale: Secondary | ICD-10-CM | POA: Diagnosis not present

## 2024-06-07 DIAGNOSIS — Z79899 Other long term (current) drug therapy: Secondary | ICD-10-CM | POA: Diagnosis not present

## 2024-06-07 DIAGNOSIS — Z87891 Personal history of nicotine dependence: Secondary | ICD-10-CM | POA: Insufficient documentation

## 2024-06-07 HISTORY — DX: Cerebral infarction, unspecified: I63.9

## 2024-06-07 LAB — I-STAT CHEM 8, ED
BUN: 13 mg/dL (ref 6–20)
Calcium, Ion: 1.18 mmol/L (ref 1.15–1.40)
Chloride: 109 mmol/L (ref 98–111)
Creatinine, Ser: 0.9 mg/dL (ref 0.44–1.00)
Glucose, Bld: 92 mg/dL (ref 70–99)
HCT: 44 % (ref 36.0–46.0)
Hemoglobin: 15 g/dL (ref 12.0–15.0)
Potassium: 3.5 mmol/L (ref 3.5–5.1)
Sodium: 143 mmol/L (ref 135–145)
TCO2: 20 mmol/L — ABNORMAL LOW (ref 22–32)

## 2024-06-07 LAB — ECHOCARDIOGRAM COMPLETE
Area-P 1/2: 3.72 cm2
Height: 65 in
S' Lateral: 3 cm
Weight: 3414.48 [oz_av]

## 2024-06-07 LAB — HIV ANTIBODY (ROUTINE TESTING W REFLEX): HIV Screen 4th Generation wRfx: NONREACTIVE

## 2024-06-07 LAB — CBC
HCT: 42.7 % (ref 36.0–46.0)
Hemoglobin: 13.3 g/dL (ref 12.0–15.0)
MCH: 25.5 pg — ABNORMAL LOW (ref 26.0–34.0)
MCHC: 31.1 g/dL (ref 30.0–36.0)
MCV: 82 fL (ref 80.0–100.0)
Platelets: 274 K/uL (ref 150–400)
RBC: 5.21 MIL/uL — ABNORMAL HIGH (ref 3.87–5.11)
RDW: 13.4 % (ref 11.5–15.5)
WBC: 6.3 K/uL (ref 4.0–10.5)
nRBC: 0 % (ref 0.0–0.2)

## 2024-06-07 LAB — COMPREHENSIVE METABOLIC PANEL WITH GFR
ALT: 26 U/L (ref 0–44)
AST: 22 U/L (ref 15–41)
Albumin: 4.3 g/dL (ref 3.5–5.0)
Alkaline Phosphatase: 39 U/L (ref 38–126)
Anion gap: 12 (ref 5–15)
BUN: 16 mg/dL (ref 6–20)
CO2: 20 mmol/L — ABNORMAL LOW (ref 22–32)
Calcium: 9.7 mg/dL (ref 8.9–10.3)
Chloride: 108 mmol/L (ref 98–111)
Creatinine, Ser: 1.01 mg/dL — ABNORMAL HIGH (ref 0.44–1.00)
GFR, Estimated: >60 mL/min (ref >60)
Glucose, Bld: 92 mg/dL (ref 70–99)
Potassium: 3.7 mmol/L (ref 3.5–5.1)
Sodium: 140 mmol/L (ref 135–145)
Total Bilirubin: 0.5 mg/dL (ref 0.0–1.2)
Total Protein: 7.4 g/dL (ref 6.5–8.1)

## 2024-06-07 LAB — APTT: aPTT: 23 s — ABNORMAL LOW (ref 24–36)

## 2024-06-07 LAB — DIFFERENTIAL
Abs Immature Granulocytes: 0.01 K/uL (ref 0.00–0.07)
Basophils Absolute: 0.1 K/uL (ref 0.0–0.1)
Basophils Relative: 1 %
Eosinophils Absolute: 0.3 K/uL (ref 0.0–0.5)
Eosinophils Relative: 5 %
Immature Granulocytes: 0 %
Lymphocytes Relative: 54 %
Lymphs Abs: 3.5 K/uL (ref 0.7–4.0)
Monocytes Absolute: 0.6 K/uL (ref 0.1–1.0)
Monocytes Relative: 9 %
Neutro Abs: 1.9 K/uL (ref 1.7–7.7)
Neutrophils Relative %: 31 %

## 2024-06-07 LAB — ANTITHROMBIN III: AntiThromb III Func: 110 % (ref 75–120)

## 2024-06-07 LAB — RAPID URINE DRUG SCREEN, HOSP PERFORMED
Amphetamines: NOT DETECTED
Barbiturates: NOT DETECTED
Benzodiazepines: NOT DETECTED
Cocaine: NOT DETECTED
Opiates: NOT DETECTED
Tetrahydrocannabinol: NOT DETECTED

## 2024-06-07 LAB — HEMOGLOBIN A1C
Hgb A1c MFr Bld: 5.5 % (ref 4.8–5.6)
Mean Plasma Glucose: 111.15 mg/dL

## 2024-06-07 LAB — ETHANOL: Alcohol, Ethyl (B): <15 mg/dL (ref <15)

## 2024-06-07 LAB — PROTIME-INR
INR: 1 (ref 0.8–1.2)
Prothrombin Time: 13.5 s (ref 11.4–15.2)

## 2024-06-07 LAB — CBG MONITORING, ED: Glucose-Capillary: 106 mg/dL — ABNORMAL HIGH (ref 70–99)

## 2024-06-07 LAB — HCG, SERUM, QUALITATIVE: Preg, Serum: NEGATIVE

## 2024-06-07 MED ORDER — ONDANSETRON HCL 4 MG/2ML IJ SOLN: 4.0000 mg | Freq: Three times a day (TID) | INTRAMUSCULAR | Status: DC | PRN

## 2024-06-07 MED ORDER — ACETAMINOPHEN 650 MG RE SUPP: 650.0000 mg | RECTAL | Status: DC | PRN

## 2024-06-07 MED ORDER — ENOXAPARIN SODIUM 40 MG/0.4ML IJ SOSY: 40.0000 mg | PREFILLED_SYRINGE | INTRAMUSCULAR | Status: DC

## 2024-06-07 MED ORDER — STROKE: EARLY STAGES OF RECOVERY BOOK
Freq: Once | Status: AC
  Filled 2024-06-07: qty 1

## 2024-06-07 MED ORDER — SODIUM CHLORIDE 0.9% FLUSH: 3.0000 mL | INTRAVENOUS | Status: DC | PRN

## 2024-06-07 MED ORDER — IOHEXOL 350 MG/ML SOLN
75.0000 mL | Freq: Once | INTRAVENOUS | Status: AC | PRN
  Administered 2024-06-07: 75 mL via INTRAVENOUS

## 2024-06-07 MED ORDER — ACETAMINOPHEN 325 MG PO TABS
650.0000 mg | ORAL_TABLET | ORAL | Status: DC | PRN
  Administered 2024-06-08: 650 mg via ORAL
  Filled 2024-06-07: qty 2

## 2024-06-07 MED ORDER — SENNOSIDES-DOCUSATE SODIUM 8.6-50 MG PO TABS: 1.0000 | ORAL_TABLET | Freq: Every evening | ORAL | Status: DC | PRN

## 2024-06-07 MED ORDER — KETOROLAC TROMETHAMINE 15 MG/ML IJ SOLN: 15.0000 mg | Freq: Four times a day (QID) | INTRAMUSCULAR | Status: DC | PRN

## 2024-06-07 MED ORDER — ASPIRIN 81 MG PO TBEC
81.0000 mg | DELAYED_RELEASE_TABLET | Freq: Every day | ORAL | Status: DC
  Administered 2024-06-08 – 2024-06-09 (×2): 81 mg via ORAL
  Filled 2024-06-07 (×2): qty 1

## 2024-06-07 MED ORDER — SODIUM CHLORIDE 0.9% FLUSH
3.0000 mL | Freq: Once | INTRAVENOUS | Status: AC
  Administered 2024-06-07: 3 mL via INTRAVENOUS

## 2024-06-07 MED ORDER — ENOXAPARIN SODIUM 40 MG/0.4ML IJ SOSY
40.0000 mg | PREFILLED_SYRINGE | INTRAMUSCULAR | Status: DC
  Administered 2024-06-07 – 2024-06-09 (×3): 40 mg via SUBCUTANEOUS
  Filled 2024-06-07 (×3): qty 0.4

## 2024-06-07 MED ORDER — MELATONIN 5 MG PO TABS
5.0000 mg | ORAL_TABLET | Freq: Every evening | ORAL | Status: DC | PRN
  Administered 2024-06-08: 5 mg via ORAL
  Filled 2024-06-07: qty 1

## 2024-06-07 MED ORDER — SODIUM CHLORIDE 0.9% FLUSH
3.0000 mL | Freq: Two times a day (BID) | INTRAVENOUS | Status: DC
  Administered 2024-06-07: 10 mL via INTRAVENOUS
  Administered 2024-06-08: 3 mL via INTRAVENOUS
  Administered 2024-06-08: 10 mL via INTRAVENOUS

## 2024-06-07 MED ORDER — ACETAMINOPHEN 160 MG/5ML PO SOLN: 650.0000 mg | ORAL | Status: DC | PRN

## 2024-06-07 MED ORDER — CLOPIDOGREL BISULFATE 300 MG PO TABS
300.0000 mg | ORAL_TABLET | Freq: Once | ORAL | Status: AC
  Administered 2024-06-07: 300 mg via ORAL
  Filled 2024-06-07: qty 1

## 2024-06-07 MED ORDER — CLOPIDOGREL BISULFATE 75 MG PO TABS
75.0000 mg | ORAL_TABLET | Freq: Every day | ORAL | Status: DC
  Administered 2024-06-08 – 2024-06-09 (×2): 75 mg via ORAL
  Filled 2024-06-07 (×3): qty 1

## 2024-06-07 MED ORDER — ASPIRIN 325 MG PO TBEC
325.0000 mg | DELAYED_RELEASE_TABLET | Freq: Once | ORAL | Status: AC
  Administered 2024-06-07: 325 mg via ORAL
  Filled 2024-06-07: qty 1

## 2024-06-07 NOTE — ED Notes (Signed)
 Dr.Lindzen made aware that MRI read was back per request. He reported he was going to come see the pt

## 2024-06-07 NOTE — Evaluation (Signed)
 Physical Therapy Evaluation and Discharge Patient Details Name: Sarah Holland MRN: [id number] DOB: 01/28/1991 Today's Date: 06/07/2024  History of Present Illness  33 year old female who presents ER 06/07/24 as a code stroke secondary to right-sided weakness. NIHSS=4; CT head negative; MRI brain no acute abnormality; PMH-migraines, gestational diabetes, anemia.  Clinical Impression  Patient evaluated by Physical Therapy with no further acute PT needs identified. All education has been completed and the patient has no further questions. Patient demonstrated inconsistent weakness in bil LEs with Rt weaker. She scored 52/56 on Berg balance Assessment (<45 indicative of incr fall risk). See below for any follow-up Physical Therapy or equipment needs. PT is signing off. Thank you for this referral.         If plan is discharge home, recommend the following: Help with stairs or ramp for entrance   Can travel by private vehicle        Equipment Recommendations None recommended by PT  Recommendations for Other Services       Functional Status Assessment Patient has had a recent decline in their functional status and demonstrates the ability to make significant improvements in function in a reasonable and predictable amount of time.     Precautions / Restrictions Precautions Precautions: None      Mobility  Bed Mobility Overal bed mobility: Independent             General bed mobility comments: on ED stretcher    Transfers Overall transfer level: Independent Equipment used: None                    Ambulation/Gait Ambulation/Gait assistance: Independent Gait Distance (Feet): 120 Feet Assistive device: None Gait Pattern/deviations: Step-through pattern, Decreased weight shift to right, Decreased stance time - right   Gait velocity interpretation: >2.62 ft/sec, indicative of community ambulatory   General Gait Details: slight limp with pt feeling RLE weakness;  able to maintain path with head turns  Careers information officer     Tilt Bed    Modified Rankin (Stroke Patients Only)       Balance                                 Standardized Balance Assessment Standardized Balance Assessment : Berg Balance Test Berg Balance Test Sit to Stand: Able to stand without using hands and stabilize independently Standing Unsupported: Able to stand safely 2 minutes Sitting with Back Unsupported but Feet Supported on Floor or Stool: Able to sit safely and securely 2 minutes Stand to Sit: Sits safely with minimal use of hands Transfers: Able to transfer safely, minor use of hands Standing Unsupported with Eyes Closed: Able to stand 10 seconds with supervision Standing Ubsupported with Feet Together: Able to place feet together independently and stand 1 minute safely From Standing, Reach Forward with Outstretched Arm: Can reach confidently >25 cm (10) From Standing Position, Pick up Object from Floor: Able to pick up shoe safely and easily From Standing Position, Turn to Look Behind Over each Shoulder: Looks behind from both sides and weight shifts well Turn 360 Degrees: Able to turn 360 degrees safely in 4 seconds or less Standing Unsupported, Alternately Place Feet on Step/Stool: Able to complete 4 steps without aid or supervision Standing Unsupported, One Foot in Front: Able to plae foot ahead of the other independently and hold 30 seconds Standing  on One Leg: Able to lift leg independently and hold > 10 seconds Total Score: 52         Pertinent Vitals/Pain Pain Assessment Pain Assessment: 0-10 Pain Score: 4  Pain Location: head Pain Descriptors / Indicators: Headache Pain Intervention(s): Limited activity within patient's tolerance, Monitored during session    Home Living Family/patient expects to be discharged to:: Private residence Living Arrangements: Spouse/significant other;Children (15,10, 4 yo  children) Available Help at Discharge: Family;Available PRN/intermittently Type of Home: House Home Access: Level entry       Home Layout: One level Home Equipment: None      Prior Function Prior Level of Function : Independent/Modified Independent;Driving;Working/employed (post office)                     Extremity/Trunk Assessment   Upper Extremity Assessment Upper Extremity Assessment: Overall WFL for tasks assessed    Lower Extremity Assessment Lower Extremity Assessment: RLE deficits/detail;LLE deficits/detail RLE Deficits / Details: quads 3+ but with inconsistent resistance given by pt RLE Sensation: WNL RLE Coordination: WNL LLE Deficits / Details: quads 4/5 with inconsistent resistance given by pt LLE Sensation: WNL LLE Coordination: WNL    Cervical / Trunk Assessment Cervical / Trunk Assessment: Other exceptions Cervical / Trunk Exceptions: overweight  Communication   Communication Communication: No apparent difficulties    Cognition Arousal: Alert Behavior During Therapy: WFL for tasks assessed/performed   PT - Cognitive impairments: No apparent impairments                         Following commands: Intact       Cueing Cueing Techniques: Verbal cues     General Comments      Exercises     Assessment/Plan    PT Assessment All further PT needs can be met in the next venue of care  PT Problem List Decreased strength;Decreased balance;Decreased mobility       PT Treatment Interventions      PT Goals (Current goals can be found in the Care Plan section)  Acute Rehab PT Goals Patient Stated Goal: get back to normal PT Goal Formulation: All assessment and education complete, DC therapy    Frequency       Co-evaluation               AM-PAC PT 6 Clicks Mobility  Outcome Measure Help needed turning from your back to your side while in a flat bed without using bedrails?: None Help needed moving from lying on your  back to sitting on the side of a flat bed without using bedrails?: None Help needed moving to and from a bed to a chair (including a wheelchair)?: None Help needed standing up from a chair using your arms (e.g., wheelchair or bedside chair)?: None Help needed to walk in hospital room?: None Help needed climbing 3-5 steps with a railing? : None 6 Click Score: 24    End of Session Equipment Utilized During Treatment: Gait belt Activity Tolerance: Patient tolerated treatment well Patient left: in bed;with call bell/phone within reach (in ED) Nurse Communication: Mobility status PT Visit Diagnosis: Muscle weakness (generalized) (M62.81);Difficulty in walking, not elsewhere classified (R26.2)    Time: 8988-8974 PT Time Calculation (min) (ACUTE ONLY): 14 min   Charges:   PT Evaluation $PT Eval Low Complexity: 1 Low   PT General Charges $$ ACUTE PT VISIT: 1 Visit          Macario RAMAN, PT  Acute Rehabilitation Services  Office [phone]   Macario SHAUNNA Soja 06/07/2024, 10:37 AM

## 2024-06-07 NOTE — ED Notes (Signed)
 Neurology at bedside.

## 2024-06-07 NOTE — H&P (Addendum)
 History and Physical    Sarah Holland FMW:[id number] DOB: October 26, 1991 DOA: 06/07/2024  DOS: the patient was seen and examined on 06/07/2024  PCP: Sherial Bail, MD   Patient coming from: Home  I have personally briefly reviewed patient's old medical records in [facility] and CareEverywhere  HPI:  No notes on file   Sarah Holland is a 33 y.o. year old female with past medical history of gestational diabetes, gestational hypertension, migraines, iron deficiency anemia, and vitamin d deficiency.  She presents to [facility], ED via EMS with code stroke after she had sudden onset of right-sided weakness at work.  Ms. Zima reports yesterday at work she was not feeling herself and felt dizzy she went over to a coworker to get help and she was unable to tell her coworker what she needed.  At this time coworker noted her to slump to the ground and coworker assisted patient to the ground. On EMS arrival patient was noted to have right-sided weakness and dysarthria and code stroke was activated.  At time of my interview patient also reporting a 5/10 frontal headache that she describes as throbbing and similar in character to her migraines.  No associated photophobia, phonophobia, or nausea at this time.  ED Course: On arrival to [facility] ED patient was noted to be afebrile temp 36.8 C, BP 150/99, HR 115, RR 18, SpO2 100% room air. Labs notable for slight elevation in creatinine at 1.1, CO2 20, negative UDS.  CT head obtained and shows an occlusion of the left proximal M3 MCA branch.  Follow-up MRI 1 hour later negative for any acute intracranial abnormality. Neurology stroke team saw patient in the ED and recommendations documented in their consult note in the chart.  Most notably after MRI came back negative neurology recommended against TNK.  In the ED she was loaded with aspirin  and Plavix .  TRH contacted for admission.     Review of Systems: As mentioned in the history of present  illness. All other systems reviewed and are negative.   Past Medical History:  Diagnosis Date   Elevated blood pressure complicating pregnancy in third trimester, antepartum 09/27/2019   Encounter for induction of labor 09/28/2019   Family history of pancreatic cancer    8/23 cancer genetic testing letter sent   Gestational diabetes    Gestational hypertension 09/28/2019   Obesity (BMI 30-39.9)    Obesity complicating peripregnancy, antepartum 08/09/2019   Supervision of high risk pregnancy, antepartum 04/05/2019   [facility] Prenatal Labs Dating LMP Blood type: B/Positive/-- (04/22 1503)  Genetic Screen NIPS: Negative XY Antibody:Negative (04/22 1503) Anatomic US  Complete 05/16/2019 Rubella: 3.96 (04/22 1503) Varicella: Immune GTT Early:               Third trimester: 204; 3 hr 98/219/204/140- RPR: Non Reactive (04/22 1503)  Rhogam N/A HBsAg: Negative (04/22 1503)  TDaP vaccine        08/02/19                Past Surgical History:  Procedure Laterality Date   APPENDECTOMY       reports that she quit smoking about 6 years ago. Her smoking use included cigarettes. She started smoking about 9 years ago. She has a 0.8 pack-year smoking history. She has never used smokeless tobacco. She reports that she does not drink alcohol and does not use drugs.  No Known Allergies  Family History  Problem Relation Age of Onset   Liver  cancer Father 56   Diabetes Maternal Grandmother    Pancreatic cancer Maternal Grandmother     Prior to Admission medications   Medication Sig Start Date End Date Taking? Authorizing Provider  medroxyPROGESTERone  (DEPO-PROVERA ) 150 MG/ML injection Inject 150 mg into the muscle every 3 (three) months.    [provider]  ondansetron  (ZOFRAN -ODT) 4 MG disintegrating tablet Take 1 tablet (4 mg total) by mouth every 8 (eight) hours as needed for nausea or vomiting. 05/14/23   Sung, Jade J, MD  predniSONE  (DELTASONE ) 20 MG tablet 1 TABLET BY MOUTH TWICE A DAY  ON DAYS 1-4, THEN ONE DAILY DAYS 5-8. Hold this medication while on antibiotic Augmentin  for your kidney infection.  Can start taking this again after finishing Augmentin  if lower back pain still present. 03/25/23   Awanda City, MD  SUMAtriptan  (IMITREX ) 100 MG tablet Take by mouth. 09/24/22 05/27/24  [provider]    Physical Exam: Vitals:   06/07/24 0500 06/07/24 0515 06/07/24 0530 06/07/24 0819  BP: (!) 143/80     Pulse: 85 80 81 86  Resp: 13 18 19 16   Temp:    98 F (36.7 C)  TempSrc:    Oral  SpO2: 100% 99% 99% 99%  Weight:      Height:        Physical Exam   Constitutional: Sleeping comfortably on arrival to room, easily arousable. Eyes: PERRL, lids and conjunctivae normal Respiratory: clear to auscultation bilaterally, no wheezing, no crackles. Normal respiratory effort. No accessory muscle use.  Cardiovascular: Regular rate and rhythm, no murmurs / rubs / gallops. No extremity edema. 2+ radial and pedal pulses.  Abdomen: Soft, nontender.  Bowel sounds positive x4 quadrants.  Musculoskeletal:  No joint deformity upper and lower extremities. Good ROM, no contractures. Normal muscle tone.  Skin: Warm, dry.  No rashes, lesions, ulcers.  Neurologic: Alert and oriented x 3. (+) RUE drift.  Decreased sensation on right side.  4/5 RUE and RLE strength. 5/5 LUE and LLE strength.  No dysarthria noted.   Labs on Admission: I have personally reviewed following labs and imaging studies  CBC: Recent Labs  Lab 06/07/24 0109  WBC 6.3  NEUTROABS 1.9  HGB 13.3  15.0  HCT 42.7  44.0  MCV 82.0  PLT 274   Basic Metabolic Panel: Recent Labs  Lab 06/07/24 0109  NA 140  143  K 3.7  3.5  CL 108  109  CO2 20*  GLUCOSE 92  92  BUN 16  13  CREATININE 1.01*  0.90  CALCIUM 9.7   GFR: Estimated Creatinine Clearance: 90.3 mL/min (A) (by C-G formula based on SCr of 1.01 mg/dL (H)). Liver Function Tests: Recent Labs  Lab 06/07/24 0109  AST 22  ALT 26  ALKPHOS 39   BILITOT 0.5  PROT 7.4  ALBUMIN 4.3   No results for input(s): LIPASE, AMYLASE in the last 168 hours. No results for input(s): AMMONIA  in the last 168 hours. Coagulation Profile: Recent Labs  Lab 06/07/24 0109  INR 1.0   Cardiac Enzymes: No results for input(s): CKTOTAL, CKMB, CKMBINDEX, TROPONINI, TROPONINIHS in the last 168 hours. BNP (last 3 results) No results for input(s): BNP in the last 8760 hours. HbA1C: Recent Labs    06/07/24 0819  HGBA1C 5.5   CBG: Recent Labs  Lab 06/07/24 0103  GLUCAP 106*   Lipid Profile: No results for input(s): CHOL, HDL, LDLCALC, TRIG, CHOLHDL, LDLDIRECT in the last 72 hours. Thyroid Function Tests: No  results for input(s): TSH, T4TOTAL, FREET4, T3FREE, THYROIDAB in the last 72 hours. Anemia Panel: No results for input(s): VITAMINB12, FOLATE, FERRITIN, TIBC, IRON, RETICCTPCT in the last 72 hours. Urine analysis:    Component Value Date/Time   COLORURINE YELLOW (A) 05/13/2023 2148   APPEARANCEUR Clear 07/01/2023 0811   LABSPEC 1.027 05/13/2023 2148   LABSPEC 1.024 06/27/2014 1153   PHURINE 5.0 05/13/2023 2148   GLUCOSEU Negative 07/01/2023 0811   GLUCOSEU see comment 06/27/2014 1153   HGBUR NEGATIVE 05/13/2023 2148   BILIRUBINUR Negative 07/01/2023 0811   BILIRUBINUR see comment 06/27/2014 1153   KETONESUR NEGATIVE 05/13/2023 2148   PROTEINUR Negative 07/01/2023 0811   PROTEINUR 30 (A) 05/13/2023 2148   UROBILINOGEN 0.2 05/01/2022 1013   NITRITE Negative 07/01/2023 0811   NITRITE POSITIVE (A) 05/13/2023 2148   LEUKOCYTESUR Trace (A) 07/01/2023 0811   LEUKOCYTESUR NEGATIVE 05/13/2023 2148   LEUKOCYTESUR see comment 06/27/2014 1153    Radiological Exams on Admission: I have personally reviewed images MR BRAIN WO CONTRAST Result Date: 06/07/2024 CLINICAL DATA:  Stroke, follow up EXAM: MRI HEAD WITHOUT CONTRAST TECHNIQUE: Multiplanar, multiecho pulse sequences of the brain and  surrounding structures were obtained without intravenous contrast. COMPARISON:  CT head from earlier today. FINDINGS: Brain: No acute infarction, hemorrhage, hydrocephalus, extra-axial collection or mass lesion. Vascular: Normal flow voids. Skull and upper cervical spine: Normal marrow signal. Sinuses/Orbits: Negative. IMPRESSION: No evidence of acute intracranial abnormality. Electronically Signed   By: Gilmore GORMAN Molt M.D.   On: 06/07/2024 03:05   CT ANGIO HEAD NECK W WO CM (CODE STROKE) Result Date: 06/07/2024 CLINICAL DATA:  Neuro deficit, acute, stroke suspected EXAM: CT ANGIOGRAPHY HEAD AND NECK WITH AND WITHOUT CONTRAST TECHNIQUE: Multidetector CT imaging of the head and neck was performed using the standard protocol during bolus administration of intravenous contrast. Multiplanar CT image reconstructions and MIPs were obtained to evaluate the vascular anatomy. Carotid stenosis measurements (when applicable) are obtained utilizing NASCET criteria, using the distal internal carotid diameter as the denominator. RADIATION DOSE REDUCTION: This exam was performed according to the departmental dose-optimization program which includes automated exposure control, adjustment of the mA and/or kV according to patient size and/or use of iterative reconstruction technique. CONTRAST:  75mL OMNIPAQUE  IOHEXOL  350 MG/ML SOLN COMPARISON:  Same day CTA head/neck. FINDINGS: CTA NECK FINDINGS Aortic arch: Great vessel origins are patent. No significant stenosis. Right carotid system: No evidence of dissection, stenosis (50% or greater), or occlusion. Left carotid system: No evidence of dissection, stenosis (50% or greater), or occlusion. Vertebral arteries: Left dominant. No evidence of dissection, stenosis (50% or greater), or occlusion. Skeleton: No acute abnormality on limited assessment. Other neck: No acute abnormality on limited assessment. Upper chest: Visualized lung apices are Review of the MIP images confirms the  above findings CTA HEAD FINDINGS Anterior circulation: Bilateral ICAs are patent without significant stenosis. Right MCA and bilateral ACAs are patent significant. M1 MCAs patent. Occlusion of a proximal M3 MCA branch (for example see series 5, image 68 and series 8, image 108). Posterior circulation: Bilateral intradural vertebral arteries, basilar artery and bilateral posterior cerebral arteries are patent without proximal hemodynamically significant stenosis. Venous sinuses: As permitted by contrast timing, patent. 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 Findings discussed with Dr. Lindzen via 1:39 a.m. Electronically Signed   By: Gilmore GORMAN Molt M.D.   On: 06/07/2024 01:50   CT HEAD CODE STROKE WO CONTRAST Result Date: 06/07/2024 CLINICAL  DATA:  Code stroke.  Neuro deficit, acute, stroke suspected EXAM: CT HEAD WITHOUT CONTRAST TECHNIQUE: Contiguous axial images were obtained from the base of the skull through the vertex without intravenous contrast. RADIATION DOSE REDUCTION: This exam was performed according to the departmental dose-optimization program which includes automated exposure control, adjustment of the mA and/or kV according to patient size and/or use of iterative reconstruction technique. COMPARISON:  CTA head/neck March 23, 2023. FINDINGS: Brain: No evidence of acute infarction, hemorrhage, hydrocephalus, extra-axial collection or mass lesion/mass effect. Vascular: No hyperdense vessel identified. Skull: No acute fracture. Sinuses/Orbits: Clear sinuses.  No acute orbital findings. Other: No mastoid effusions. ASPECTS [facility] Stroke Program Early CT Score) Total score (0-10 with 10 being normal): 10. IMPRESSION: No evidence of acute intracranial abnormality. ASPECTS is 10. Code stroke imaging results were communicated on 06/07/2024 at 1:20 am to provider Dr. Merrianne via secure text paging. Electronically Signed   By: Gilmore GORMAN Molt M.D.   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 Follow up MRI without acute intracranial abnormality - Permissive HTN x 24 hrs (until 1AM 06/08/24). Goal B/P <220/110. PRN hydralazine if BP above these parameters. - ECHO - ASA+Plavix  per neuro. ASA 81 mg PO daily + Plavix  75 mg daily for 21 days. ASA monotherapy thereafter.  - A1C and LDL - Add statin per guidelines (escalate Statin if LDL above 70) - q4H neuro checks - STAT head CT for any change in neuro exam - Monitor on telemetry - PT/OT/SLP  Chronic medical issues: #Migraine headaches - As needed Toradol   VTE prophylaxis:  Lovenox   GI prophylaxis: Not indicated  Diet: Heart healthy Access: PIV Lines: None Code Status:  Full Code Telemetry: Yes  Admission status: Observation, Telemetry bed Patient is from: Home Anticipated d/c is to: Home Anticipated d/c date is: 1-2 days Patient currently: Pending stroke workup   Family Communication: No family at bedside.  Plan of care discussed with patient questions elicited and answered to patient's expressed satisfaction.  Consults called: Neurology called by EDP   Severity of Illness: The appropriate patient status for this patient is OBSERVATION. Observation status is judged to be reasonable and necessary in order to provide the required intensity of service to ensure the patient's safety. The patient's presenting symptoms, physical exam findings, and initial radiographic and laboratory data in the context of their medical condition is felt to place them at decreased risk for further clinical deterioration. Furthermore, it is anticipated that the patient will be medically stable for discharge from the hospital within 2 midnights of admission.   To reach the provider On-Call:   7AM- 7PM see care teams to locate the attending and  reach out to them via [email or web address] Password: [email or web address] 7PM-7AM contact night-coverage If you still have difficulty reaching the appropriate provider, please page the [facility] (Director on Call) for [facility] on amion for assistance  This document was prepared using Conservation officer, historic buildings and may include unintentional dictation errors.  Rockie Rams FNP-BC, PMHNP-BC Nurse Practitioner [facility]

## 2024-06-07 NOTE — Code Documentation (Signed)
 Stroke Response Nurse Documentation Code Documentation  SERENIDY WALTZ is a 33 y.o. female arriving to [facility]  via [facility] on 06/07/2024 with past medical hx of migraines, gestational diabetes and gestational HTN. On No antithrombotic. Code stroke was activated by EMS.   Patient from work where she was LKW at 0001 and now complaining of right sided weakness with tremor, dysarthria and left gaze. Patient was a work when she had sudden onset of weakness which lead to a controled fall. EMS was called, she was found to have right sided weakness, slurred speech and a left gaze.  Stroke team at the bedside on patient arrival. Labs drawn and patient cleared for CT by EDP.  Patient to CT with team. NIHSS 4, see documentation for details and code stroke times. Patient with right arm weakness, right leg weakness, right decreased sensation, and dysarthria  on exam. The following imaging was completed:  CT Head, CTA, and MRI. Dr. Lindzen discussed risks and benefits of TNK with patient and patients family. Family asked for additional time. Additional time was given, but they were still unsure. Family and Dr. Lindzen decided a MRI would be done and then they can further discuss once the results are back. MRI negative for acute abnormality.  Bedside handoff with ED RN Kayla Hambrick.    Delon Daub  Stroke Response RN

## 2024-06-07 NOTE — ED Notes (Signed)
 Pt able to stand and use bedside commode. Pt R arm noted to be no longer shaking

## 2024-06-07 NOTE — Plan of Care (Signed)

## 2024-06-07 NOTE — ED Provider Notes (Signed)
 [place] EMERGENCY DEPARTMENT AT [facility] Provider Note   CSN: [id number] Arrival date & time: 06/07/24  9898  An emergency department physician performed an initial assessment on this suspected stroke patient at 0102.  Patient presents with: Code Stroke   Sarah STURKEY is a 33 y.o. female.   33 year old female who presents ER today as a code stroke secondary to right-sided weakness.  Patient was at work and a Radio broadcast assistant noticed her to slump to the ground.  She was having trouble speaking and initially so called EMS.  On their arrival she had right sided weakness so a code stroke was activated and brought here for further evaluation.  Difficult to obtain history at this time secondary to dysarthria and acuity of situation..        Prior to Admission medications   Medication Sig Start Date End Date Taking? Authorizing Provider  medroxyPROGESTERone  (DEPO-PROVERA ) 150 MG/ML injection Inject 150 mg into the muscle every 3 (three) months.    [provider]  ondansetron  (ZOFRAN -ODT) 4 MG disintegrating tablet Take 1 tablet (4 mg total) by mouth every 8 (eight) hours as needed for nausea or vomiting. 05/14/23   Sung, Jade J, MD  predniSONE  (DELTASONE ) 20 MG tablet 1 TABLET BY MOUTH TWICE A DAY ON DAYS 1-4, THEN ONE DAILY DAYS 5-8. Hold this medication while on antibiotic Augmentin  for your kidney infection.  Can start taking this again after finishing Augmentin  if lower back pain still present. 03/25/23   Awanda City, MD  SUMAtriptan  (IMITREX ) 100 MG tablet Take by mouth. 09/24/22 05/27/24  [provider]    Allergies: Patient has no known allergies.    Review of Systems  Updated Vital Signs BP (!) 143/80   Pulse 81   Temp 98.3 F (36.8 C) (Oral)   Resp 19   Ht 5' 5 (1.651 m)   Wt 96.8 kg   SpO2 99%   BMI 35.51 kg/m   Physical Exam Vitals and nursing note reviewed.  Constitutional:      Appearance: She is well-developed.  HENT:     Head:  Normocephalic and atraumatic.  Cardiovascular:     Rate and Rhythm: Normal rate and regular rhythm.  Pulmonary:     Effort: No respiratory distress.     Breath sounds: No stridor.  Abdominal:     General: There is no distension.  Musculoskeletal:     Cervical back: Normal range of motion.  Skin:    General: Skin is warm and dry.  Neurological:     Mental Status: She is alert.     Motor: Weakness present.     Comments: Right sided tremors     (all labs ordered are listed, but only abnormal results are displayed) Labs Reviewed  APTT - Abnormal; Notable for the following components:      Result Value   aPTT 23 (*)    All other components within normal limits  CBC - Abnormal; Notable for the following components:   RBC 5.21 (*)    MCH 25.5 (*)    All other components within normal limits  COMPREHENSIVE METABOLIC PANEL WITH GFR - Abnormal; Notable for the following components:   CO2 20 (*)    Creatinine, Ser 1.01 (*)    All other components within normal limits  I-STAT CHEM 8, ED - Abnormal; Notable for the following components:   TCO2 20 (*)    All other components within normal limits  CBG MONITORING, ED - Abnormal; Notable  for the following components:   Glucose-Capillary 106 (*)    All other components within normal limits  PROTIME-INR  DIFFERENTIAL  ETHANOL  HCG, SERUM, QUALITATIVE  RAPID URINE DRUG SCREEN, HOSP PERFORMED    EKG: None  Radiology: MR BRAIN WO CONTRAST Result Date: 06/07/2024 CLINICAL DATA:  Stroke, follow up EXAM: MRI HEAD WITHOUT CONTRAST TECHNIQUE: Multiplanar, multiecho pulse sequences of the brain and surrounding structures were obtained without intravenous contrast. COMPARISON:  CT head from earlier today. FINDINGS: Brain: No acute infarction, hemorrhage, hydrocephalus, extra-axial collection or mass lesion. Vascular: Normal flow voids. Skull and upper cervical spine: Normal marrow signal. Sinuses/Orbits: Negative. IMPRESSION: No evidence of  acute intracranial abnormality. Electronically Signed   By: Gilmore GORMAN Molt M.D.   On: 06/07/2024 03:05   CT ANGIO HEAD NECK W WO CM (CODE STROKE) Result Date: 06/07/2024 CLINICAL DATA:  Neuro deficit, acute, stroke suspected EXAM: CT ANGIOGRAPHY HEAD AND NECK WITH AND WITHOUT CONTRAST TECHNIQUE: Multidetector CT imaging of the head and neck was performed using the standard protocol during bolus administration of intravenous contrast. Multiplanar CT image reconstructions and MIPs were obtained to evaluate the vascular anatomy. Carotid stenosis measurements (when applicable) are obtained utilizing NASCET criteria, using the distal internal carotid diameter as the denominator. RADIATION DOSE REDUCTION: This exam was performed according to the departmental dose-optimization program which includes automated exposure control, adjustment of the mA and/or kV according to patient size and/or use of iterative reconstruction technique. CONTRAST:  75mL OMNIPAQUE  IOHEXOL  350 MG/ML SOLN COMPARISON:  Same day CTA head/neck. FINDINGS: CTA NECK FINDINGS Aortic arch: Great vessel origins are patent. No significant stenosis. Right carotid system: No evidence of dissection, stenosis (50% or greater), or occlusion. Left carotid system: No evidence of dissection, stenosis (50% or greater), or occlusion. Vertebral arteries: Left dominant. No evidence of dissection, stenosis (50% or greater), or occlusion. Skeleton: No acute abnormality on limited assessment. Other neck: No acute abnormality on limited assessment. Upper chest: Visualized lung apices are Review of the MIP images confirms the above findings CTA HEAD FINDINGS Anterior circulation: Bilateral ICAs are patent without significant stenosis. Right MCA and bilateral ACAs are patent significant. M1 MCAs patent. Occlusion of a proximal M3 MCA branch (for example see series 5, image 68 and series 8, image 108). Posterior circulation: Bilateral intradural vertebral arteries,  basilar artery and bilateral posterior cerebral arteries are patent without proximal hemodynamically significant stenosis. Venous sinuses: As permitted by contrast timing, patent. 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 Findings discussed with Dr. Lindzen via 1:39 a.m. Electronically Signed   By: Gilmore GORMAN Molt M.D.   On: 06/07/2024 01:50   CT HEAD CODE STROKE WO CONTRAST Result Date: 06/07/2024 CLINICAL DATA:  Code stroke.  Neuro deficit, acute, stroke suspected EXAM: CT HEAD WITHOUT CONTRAST TECHNIQUE: Contiguous axial images were obtained from the base of the skull through the vertex without intravenous contrast. RADIATION DOSE REDUCTION: This exam was performed according to the departmental dose-optimization program which includes automated exposure control, adjustment of the mA and/or kV according to patient size and/or use of iterative reconstruction technique. COMPARISON:  CTA head/neck March 23, 2023. FINDINGS: Brain: No evidence of acute infarction, hemorrhage, hydrocephalus, extra-axial collection or mass lesion/mass effect. Vascular: No hyperdense vessel identified. Skull: No acute fracture. Sinuses/Orbits: Clear sinuses.  No acute orbital findings. Other: No mastoid effusions. ASPECTS [facility] Stroke Program Early CT Score) Total score (0-10 with 10 being normal): 10. IMPRESSION: No evidence of  acute intracranial abnormality. ASPECTS is 10. Code stroke imaging results were communicated on 06/07/2024 at 1:20 am to provider Dr. Merrianne via secure text paging. Electronically Signed   By: Gilmore GORMAN Molt M.D.   On: 06/07/2024 01:20     .Critical Care  Performed by: Lorette Mayo, MD Authorized by: Lorette Mayo, MD   Critical care provider statement:    Critical care time (minutes):  30   Critical care was necessary to treat or prevent imminent or life-threatening deterioration of the following conditions:  CNS failure or  compromise   Critical care was time spent personally by me on the following activities:  Development of treatment plan with patient or surrogate, discussions with consultants, evaluation of patient's response to treatment, examination of patient, ordering and review of laboratory studies, ordering and review of radiographic studies, ordering and performing treatments and interventions, pulse oximetry, re-evaluation of patient's condition and review of old charts    Medications Ordered in the ED  sodium chloride  flush (NS) 0.9 % injection 3 mL (3 mLs Intravenous Given 06/07/24 0144)  iohexol  (OMNIPAQUE ) 350 MG/ML injection 75 mL (75 mLs Intravenous Contrast Given 06/07/24 0135)  clopidogrel  (PLAVIX ) tablet 300 mg (300 mg Oral Given 06/07/24 0455)  aspirin  EC tablet 325 mg (325 mg Oral Given 06/07/24 0455)                                    Medical Decision Making Amount and/or Complexity of Data Reviewed Labs: ordered. Radiology: ordered.  Risk Decision regarding hospitalization.   Patient found to have an acute M3 occlusion on CTA.  Patient refused TNK initially with myself and neurology and wanted wait for the MRI.  MRI did not show any acute stroke on my interpretation and radiology read and symptoms were improving so patient ultimately did not get TNK.  Discussed with neurology who recommends hospitalist admission.  Admitted to Dr. Segars.     Final diagnoses:  Cerebrovascular accident (CVA), unspecified mechanism [facility])    ED Discharge Orders     None          Rhyse Loux, Mayo, MD 06/07/24 [phone]

## 2024-06-07 NOTE — ED Triage Notes (Addendum)
 Pt was at work walking with a coworker when she suddenly collapsed. Coworker helped her to the ground and EMS was called. EMS reports pt having R arm weakness, narrowing vision fields and feeling dizzy. BGS 106. Code stroke called en route. Pt denies blood thinner use or hx of strokes or blood clots.

## 2024-06-07 NOTE — Progress Notes (Signed)
 TCD bubble study has been completed.   Results can be found under chart review under CV PROC. 06/07/2024 4:31 PM Kenley Rettinger RVT, RDMS

## 2024-06-07 NOTE — Consult Note (Addendum)
 NEUROLOGY CONSULT NOTE   Date of service: June 07, 2024 Patient Name: Sarah Holland MRN:  [id number] DOB:  1991-09-10 Chief Complaint: Right sided weakness Requesting Provider: No att. providers found  History of Present Illness  Sarah Holland is a 33 y.o. female with a PMHx of migraines, gestational diabetes and gestational HTN, who presents to the ED via EMS as a Code Stroke after she had sudden onset of weakness at work resulting in a controlled fall (helped to the ground by a coworker), after which she was noted to be dysarthric with right sided weakness. On arrival to the ED her deficits were unchanged. CBG was 106. BP per EMS was 170/100. LKN midnight.   In CT, she complains of a 3-4/10 headache that is present occipitally. She states that it does not feel like one of her typical migraines. Regarding her coarse, waxing and waning RUE tremor, she states that this has never happened to her before. She has no prior history of seizures. She denies any recent psychosocial stressors.   LKW: Midnight Modified rankin score: 0-Completely asymptomatic and back to baseline post- stroke IV Thrombolysis:  No: Significant improvement in neurological exam after MRI with NIHSS decreased to 1 EVT: No: Risks significantly outweigh benefits.    NIHSS components Score: Comment  1a Level of Conscious 0[x]  1[]  2[]  3[]      1b LOC Questions 0[x]  1[]  2[]       1c LOC Commands 0[x]  1[]  2[]       2 Best Gaze 0[x]  1[]  2[]       3 Visual 0[x]  1[]  2[]  3[]       4 Facial Palsy 0[x]  1[]  2[]  3[]      5a Motor Arm - left 0[x]  1[]  2[]  3[]  4[]  UN[]    5b Motor Arm - Right 0[]  1[x]  2[]  3[]  4[]  UN[]   Bobbing drift. Will elevate back to starting point after drifting. Coarse tremor that varies in amplitude and frequency, and is distractible  6a Motor Leg - Left 0[x]  1[]  2[]  3[]  4[]  UN[]    6b Motor Leg - Right 0[]  1[x]  2[]  3[]  4[]  UN[]   Bobbing drift.   7 Limb Ataxia 0[x]  1[]  2[]  UN[]      8 Sensory 0[]  1[x]  2[]  UN[]      Decreased to RUE and RLE.   9 Best Language 0[x]  1[]  2[]  3[]       10 Dysarthria 0[]  1[x]  2[]  UN[]     Atypical pattern suggestive of embellishment  11 Extinct. and Inattention 0[x]  1[]  2[]       TOTAL:   4      ROS  Comprehensive ROS performed and pertinent positives documented in HPI   Past History   Past Medical History:  Diagnosis Date   Elevated blood pressure complicating pregnancy in third trimester, antepartum 09/27/2019   Encounter for induction of labor 09/28/2019   Family history of pancreatic cancer    8/23 cancer genetic testing letter sent   Gestational diabetes    Gestational hypertension 09/28/2019   Obesity (BMI 30-39.9)    Obesity complicating peripregnancy, antepartum 08/09/2019   Supervision of high risk pregnancy, antepartum 04/05/2019   [facility] Prenatal Labs Dating LMP Blood type: B/Positive/-- (04/22 1503)  Genetic Screen NIPS: Negative XY Antibody:Negative (04/22 1503) Anatomic US  Complete 05/16/2019 Rubella: 3.96 (04/22 1503) Varicella: Immune GTT Early:               Third trimester: 204; 3 hr 98/219/204/140- RPR: Non Reactive (04/22 1503)  Rhogam N/A HBsAg: Negative (04/22 1503)  TDaP vaccine        08/02/19               Past Surgical History:  Procedure Laterality Date   APPENDECTOMY      Family History: Family History  Problem Relation Age of Onset   Liver cancer Father 68   Diabetes Maternal Grandmother    Pancreatic cancer Maternal Grandmother     Social History  reports that she quit smoking about 6 years ago. Her smoking use included cigarettes. She started smoking about 9 years ago. She has a 0.8 pack-year smoking history. She has never used smokeless tobacco. She reports that she does not drink alcohol and does not use drugs.  No Known Allergies  Medications   Current Facility-Administered Medications:    sodium chloride  flush (NS) 0.9 % injection 3 mL, 3 mL, Intravenous, Once, Sarah Holland, Selinda, MD  Current Outpatient Medications:     medroxyPROGESTERone  (DEPO-PROVERA ) 150 MG/ML injection, Inject 150 mg into the muscle every 3 (three) months., Disp: , Rfl:    ondansetron  (ZOFRAN -ODT) 4 MG disintegrating tablet, Take 1 tablet (4 mg total) by mouth every 8 (eight) hours as needed for nausea or vomiting., Disp: 20 tablet, Rfl: 0   predniSONE  (DELTASONE ) 20 MG tablet, 1 TABLET BY MOUTH TWICE A DAY ON DAYS 1-4, THEN ONE DAILY DAYS 5-8. Hold this medication while on antibiotic Augmentin  for your kidney infection.  Can start taking this again after finishing Augmentin  if lower back pain still present., Disp: , Rfl:    SUMAtriptan  (IMITREX ) 100 MG tablet, Take by mouth., Disp: , Rfl:   Vitals   Vitals:   06/24/2024 0100  Weight: 96.8 kg  Height: 5' 5 (1.651 m)    Body mass index is 35.51 kg/m.   Physical Exam   Constitutional: Appears well-developed and well-nourished.  Psych: Anxious affect.  Eyes: No scleral injection.  HENT: No OP obstruction.  Head: Normocephalic.  Respiratory: Effort normal, non-labored breathing.    Neurologic Examination   See NIHSS  Labs/Imaging/Neurodiagnostic studies   CBC:  Recent Labs  Lab 06-24-24 0109  HGB 15.0  HCT 44.0   Basic Metabolic Panel:  Lab Results  Component Value Date   NA 143 06/24/2024   K 3.5 2024-06-24   CO2 21 (L) 05/13/2023   GLUCOSE 92 June 24, 2024   BUN 13 2024-06-24   CREATININE 0.90 06/24/2024   CALCIUM 9.6 05/13/2023   GFRNONAA >60 05/13/2023   GFRAA >60 09/27/2019   Lipid Panel: No results found for: LDLCALC HgbA1c: No results found for: HGBA1C Urine Drug Screen: No results found for: LABOPIA, COCAINSCRNUR, LABBENZ, AMPHETMU, THCU, LABBARB  Alcohol Level No results found for: [facility] INR  Lab Results  Component Value Date   INR 1.00 05/09/2015   APTT  Lab Results  Component Value Date   APTT 29 05/09/2015     ASSESSMENT  Sarah Holland is a 33 y.o. female with a PMHx of migraines, gestational diabetes and gestational  HTN, who presents to the ED via EMS as a Code Stroke after she had sudden onset of weakness at work resulting in a controlled fall (helped to the ground by a coworker), after which she was noted to be dysarthric with right sided weakness. On arrival to the ED her deficits were unchanged. CBG was 106. BP per EMS was 170/100. LKN midnight.  - Exam reveals bobbing drift of RUE and RLE, dysarthria and decreased sensation on the right. NIHSS 4.  - CT head: No evidence  of acute intracranial abnormality. ASPECTS is 10.  - CTA of head and neck (preliminary read by Radiology): Proximal left M3 occlusion. - Risks and benefits of TNK discussed with the patient and family. She wishes to have an MRI brain prior to making a decision regarding TNK.   RECOMMENDATIONS  - STAT MRI - Frequent neuro checks    Addendum: - MRI brain: No evidence of acute intracranial abnormality.  - Exam now improved, with resolution of her RUE weakness, mild residual RLE weakness and improved dysarthria. NIHSS 1.  - Discussed further the risks and benefits of TNK with the patient. Given her negative MRI and rapidly improving symptoms, the risks of TNK are now felt to significantly outweigh the potential benefits.  - Administering loading dose of Plavix  300 mg and ASA 325 mg x 1 now.  - IVF - HgbA1c, fasting lipid panel - PT consult, OT consult, Speech consult - Echocardiogram - Atorvastatin if indicated based on fasting lipid panel - Starting tomorrow: ASA 81 mg po every day, Plavix  75 mg po every day. Continue DAPT x 21 days, then ASA alone thereafter.  - Risk factor modification - Telemetry monitoring - Frequent neuro checks - NPO until passes stroke swallow screen - UDS (ordered) - Permissive HTN x 24 hours.  - Hypercoagulable panel - ESR and CRP.  - Lupus panel - Stroke Team to follow in the AM  I personally spent a total of 50 minutes in the care of the patient today including getting/reviewing separately obtained  history, performing a medically appropriate exam/evaluation, placing orders, referring and communicating with other health care professionals, documenting clinical information in the EHR, and independently interpreting results.  ______________________________________________________________________    Bonney SHARK, Florine Sprenkle, MD [facility]

## 2024-06-07 NOTE — ED Notes (Signed)
 Dr.Linden at bedside to discuss giving TNK with pt and family. Pt voiced wanting to talk over the decision on taking medication or not

## 2024-06-07 NOTE — Evaluation (Signed)
 Occupational Therapy Evaluation Patient Details Name: Sarah Holland MRN: [id number] DOB: [birth year] Today's Date: 06/07/2024   History of Present Illness   33 year old female who presents ER 06/07/24 as a code stroke secondary to right-sided weakness. NIHSS=4; CT head negative; MRI brain no acute abnormality. 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 PMH-migraines, gestational diabetes, anemia.     Clinical Impressions PTA, pt lived with family and worked for the [employer]. Upon eval, pt remains independent in ADL. Pt scored a 6 on the short blessed test of cognition and presents with mild RUE weakness. Of note, pt with HA during session which could have contributed to performance of short blessed test. Recommending OP OT to optimize RUE strength and for further cognitive assessment to ensure safe transition back to work and care giving responsibilities as pt with 3 children given positive CTA, but suspect pt may progress and not need follow up therapy within a few days.      If plan is discharge home, recommend the following:   Other (comment) (on pt request)     Functional Status Assessment   Patient has had a recent decline in their functional status and demonstrates the ability to make significant improvements in function in a reasonable and predictable amount of time.     Equipment Recommendations   None recommended by OT     Recommendations for Other Services         Precautions/Restrictions   Precautions Precautions: None     Mobility Bed Mobility Overal bed mobility: Independent                  Transfers Overall transfer level: Independent Equipment used: None                      Balance                                           ADL either performed or assessed with clinical judgement   ADL Overall ADL's : Modified independent                                             Vision  Patient Visual Report: No change from baseline Vision Assessment?: No apparent visual deficits Additional Comments: saccades, tracking, visual fields all appear [facility]     Perception Perception: Not tested       Praxis Praxis: Not tested       Pertinent Vitals/Pain Pain Assessment Pain Assessment: Faces Faces Pain Scale: Hurts little more Pain Location: head Pain Descriptors / Indicators: Headache Pain Intervention(s): Limited activity within patient's tolerance, Monitored during session     Extremity/Trunk Assessment Upper Extremity Assessment Upper Extremity Assessment: Right hand dominant;RUE deficits/detail RUE Deficits / Details: 4/5 as compared to L 5/5 RUE Sensation: WNL RUE Coordination: WNL   Lower Extremity Assessment Lower Extremity Assessment: Defer to PT evaluation RLE Deficits / Details: quads 3+ but with inconsistent resistance given by pt RLE Sensation: WNL RLE Coordination: WNL LLE Deficits / Details: quads 4/5 with inconsistent resistance given by pt LLE Sensation: WNL LLE Coordination: WNL   Cervical / Trunk Assessment Cervical / Trunk Assessment: Other exceptions Cervical / Trunk Exceptions: overweight   Communication Communication Communication: No apparent difficulties  Cognition Arousal: Alert Behavior During Therapy: WFL for tasks assessed/performed Cognition: Cognition impaired       Memory impairment (select all impairments): Short-term memory, Working Biochemist, clinical functioning impairment (select all impairments): Problem solving, Sequencing, Organization OT - Cognition Comments: Pt scored a 6 on the short blessed test indicating questionable impairment.                 Following commands: Intact       Cueing  General Comments   Cueing Techniques: Verbal cues  VSS   Exercises     Shoulder Instructions      Home Living Family/patient expects to be discharged to:: Private residence Living Arrangements:  Spouse/significant other;Children (15, 10, AND 4 Y.O. children) Available Help at Discharge: Family;Available PRN/intermittently Type of Home: House Home Access: Level entry     Home Layout: One level     Bathroom Shower/Tub: Producer, television/film/video: Standard     Home Equipment: None          Prior Functioning/Environment Prior Level of Function : Independent/Modified Independent;Driving;Working/employed               ADLs Comments: works at post office    OT Problem List: Decreased strength;Impaired balance (sitting and/or standing);Decreased cognition   OT Treatment/Interventions: Self-care/ADL training;Therapeutic exercise;Cognitive remediation/compensation;Patient/family education;Balance training;Therapeutic activities      OT Goals(Current goals can be found in the care plan section)   Acute Rehab OT Goals Patient Stated Goal: get better and back home OT Goal Formulation: With patient Time For Goal Achievement: 06/21/24 Potential to Achieve Goals: Good   OT Frequency:  Min 2X/week (likely see one more session)    Co-evaluation              AM-PAC OT 6 Clicks Daily Activity     Outcome Measure Help from another person eating meals?: None Help from another person taking care of personal grooming?: None Help from another person toileting, which includes using toliet, bedpan, or urinal?: None Help from another person bathing (including washing, rinsing, drying)?: None Help from another person to put on and taking off regular upper body clothing?: None Help from another person to put on and taking off regular lower body clothing?: None 6 Click Score: 24   End of Session Nurse Communication: Mobility status  Activity Tolerance: Patient tolerated treatment well Patient left: in bed;with call bell/phone within reach  OT Visit Diagnosis: Muscle weakness (generalized) (M62.81);Other symptoms and signs involving cognitive function                 Time: 1050-1107 OT Time Calculation (min): 17 min Charges:  OT General Charges $OT Visit: 1 Visit OT Evaluation $OT Eval Low Complexity: 1 Low  Elma JONETTA Lebron FREDERICK, OTR/L [facility] Acute Rehabilitation Office: [phone]   Elma JONETTA Lebron 06/07/2024, 11:30 AM

## 2024-06-07 NOTE — ED Notes (Addendum)
 This RN, Pharmacist and Dr.Lindzen at bedside to discuss pt answer on taking TNK or not. Pt and family asked for additional time. Dr.Lindzen gaev this RN a verbal order for MRI stat to be done as well.

## 2024-06-07 NOTE — Progress Notes (Signed)
   [facility] has been requested to perform a transesophageal echocardiogram on Sarah Holland for a Stroke.     The patient does NOT have any absolute or relative contraindications to a Transesophageal Echocardiogram (TEE).  The patient has: No other conditions that may impact this procedure.   After careful review of history and examination, the risks and benefits of transesophageal echocardiogram have been explained including risks of esophageal damage, perforation (1:10,000 risk), bleeding, pharyngeal hematoma as well as other potential complications associated with conscious sedation including aspiration, arrhythmia, respiratory failure and death. Alternatives to treatment were discussed, questions were answered. Patient is willing to proceed.   Bonney Morse Clause, PA-C  06/07/2024 4:26 PM

## 2024-06-07 NOTE — Progress Notes (Signed)
 STROKE TEAM PROGRESS NOTE   SUBJECTIVE (INTERVAL HISTORY) Her mom and sister are at the bedside.  Overall her condition is stable. She still has mild right sided weakness but otherwise symptoms resolved.  Pending further stroke workup including TEE.   OBJECTIVE Temp:  [98 F (36.7 C)-99.2 F (37.3 C)] 98.4 F (36.9 C) (07/08 1509) Pulse Rate:  [77-115] 78 (07/08 1509) Cardiac Rhythm: Normal sinus rhythm (07/08 1620) Resp:  [13-26] 20 (07/08 1509) BP: (119-160)/(60-108) 119/60 (07/08 1509) SpO2:  [96 %-100 %] 100 % (07/08 1509) Weight:  [96.8 kg] 96.8 kg (07/08 0100)  Recent Labs  Lab 06/07/24 0103  GLUCAP 106*   Recent Labs  Lab 06/07/24 0109  NA 140  143  K 3.7  3.5  CL 108  109  CO2 20*  GLUCOSE 92  92  BUN 16  13  CREATININE 1.01*  0.90  CALCIUM 9.7   Recent Labs  Lab 06/07/24 0109  AST 22  ALT 26  ALKPHOS 39  BILITOT 0.5  PROT 7.4  ALBUMIN 4.3   Recent Labs  Lab 06/07/24 0109  WBC 6.3  NEUTROABS 1.9  HGB 13.3  15.0  HCT 42.7  44.0  MCV 82.0  PLT 274   No results for input(s): CKTOTAL, CKMB, CKMBINDEX, TROPONINI in the last 168 hours. Recent Labs    06/07/24 0109  LABPROT 13.5  INR 1.0   No results for input(s): COLORURINE, LABSPEC, PHURINE, GLUCOSEU, HGBUR, BILIRUBINUR, KETONESUR, PROTEINUR, UROBILINOGEN, NITRITE, LEUKOCYTESUR in the last 72 hours.  Invalid input(s): APPERANCEUR  No results found for: CHOL, TRIG, HDL, CHOLHDL, VLDL, LDLCALC Lab Results  Component Value Date   HGBA1C 5.5 06/07/2024      Component Value Date/Time   LABOPIA NONE DETECTED 06/07/2024 0442   COCAINSCRNUR NONE DETECTED 06/07/2024 0442   LABBENZ NONE DETECTED 06/07/2024 0442   AMPHETMU NONE DETECTED 06/07/2024 0442   THCU NONE DETECTED 06/07/2024 0442   LABBARB NONE DETECTED 06/07/2024 0442    Recent Labs  Lab 06/07/24 0109  ETH <15    I have personally reviewed the radiological images below and agree  with the radiology interpretations.  VAS US  TRANSCRANIAL DOPPLER W BUBBLES Result Date: 06/07/2024  Transcranial Doppler with Bubble Patient Name:  Sarah Holland  Date of Exam:   06/07/2024 Medical Rec #: [id number]         Accession #:    [id number] Date of Birth: 1990-12-11         Patient Gender: F Patient Age:   50 years Exam Location:  [facility] Procedure:      VAS US  TRANSCRANIAL DOPPLER W BUBBLES Referring Phys: ARY Tyniesha Howald --------------------------------------------------------------------------------  Indications: Stroke. Comparison Study: No previous exams Performing Technologist: Jody Hill RVT, RDMS  Examination Guidelines: A complete evaluation includes B-mode imaging, spectral Doppler, color Doppler, and power Doppler as needed of all accessible portions of each vessel. Bilateral testing is considered an integral part of a complete examination. Limited examinations for reoccurring indications may be performed as noted.  Summary:  A vascular evaluation was performed. The left middle cerebral artery was studied. An IV was inserted into the patient's left forearm. Verbal informed consent was obtained.  Less than 10 HITS detected at rest, indicating a Spencer Grade 1 PFO. With valsalva, between 30-100 HITs detected, indicating a Spencer Grade 3 PFO. clinical correlation recommended *See table(s) above for TCD measurements and observations.  Diagnosing physician: ARY Cummins MD Electronically signed by ARY Cummins MD on 06/07/2024 at 5:32:47 PM.  Final    ECHOCARDIOGRAM COMPLETE Result Date: 06/07/2024    ECHOCARDIOGRAM REPORT   Patient Name:   Sarah Holland Date of Exam: 06/07/2024 Medical Rec #:  [id number]        Height:       65.0 in Accession #:    [id number]       Weight:       213.4 lb Date of Birth:  Jul 26, 1991        BSA:          2.033 m Patient Age:    32 years         BP:           119/60 mmHg Patient Gender: F                HR:           71 bpm. Exam Location:  Inpatient Procedure:  2D Echo, Cardiac Doppler and Color Doppler (Both Spectral and Color            Flow Doppler were utilized during procedure). Indications:    Stroke  History:        Patient has no prior history of Echocardiogram examinations.  Sonographer:    Philomena Daring Referring Phys: [id number] KATY L FOUST IMPRESSIONS  1. Left ventricular ejection fraction, by estimation, is 60 to 65%. The left ventricle has normal function. The left ventricle has no regional wall motion abnormalities. Left ventricular diastolic parameters were normal.  2. Right ventricular systolic function is normal. The right ventricular size is normal.  3. The mitral valve is normal in structure. No evidence of mitral valve regurgitation. No evidence of mitral stenosis.  4. The aortic valve is tricuspid. Aortic valve regurgitation is not visualized. No aortic stenosis is present.  5. The inferior vena cava is normal in size with greater than 50% respiratory variability, suggesting right atrial pressure of 3 mmHg.  6. Evidence of atrial level shunting detected by color flow Doppler. Agitated saline contrast bubble study was positive with shunting observed within 3-6 cardiac cycles suggestive of interatrial shunt. There is a large patent foramen ovale. Comparison(s): No prior Echocardiogram. FINDINGS  Left Ventricle: Left ventricular ejection fraction, by estimation, is 60 to 65%. The left ventricle has normal function. The left ventricle has no regional wall motion abnormalities. The left ventricular internal cavity size was normal in size. There is  no left ventricular hypertrophy. Left ventricular diastolic parameters were normal. Right Ventricle: The right ventricular size is normal. No increase in right ventricular wall thickness. Right ventricular systolic function is normal. Left Atrium: Left atrial size was normal in size. Right Atrium: Right atrial size was normal in size. Pericardium: There is no evidence of pericardial effusion. Mitral Valve: The  mitral valve is normal in structure. No evidence of mitral valve regurgitation. No evidence of mitral valve stenosis. Tricuspid Valve: The tricuspid valve is normal in structure. Tricuspid valve regurgitation is not demonstrated. No evidence of tricuspid stenosis. Aortic Valve: The aortic valve is tricuspid. Aortic valve regurgitation is not visualized. No aortic stenosis is present. Pulmonic Valve: The pulmonic valve was normal in structure. Pulmonic valve regurgitation is mild. No evidence of pulmonic stenosis. Aorta: The aortic root and ascending aorta are structurally normal, with no evidence of dilitation, the aortic root is normal in size and structure and the aortic root, ascending aorta and aortic arch are all structurally normal, with no evidence of dilitation or obstruction. Venous: The inferior vena cava is normal  in size with greater than 50% respiratory variability, suggesting right atrial pressure of 3 mmHg. IAS/Shunts: Evidence of atrial level shunting detected by color flow Doppler. Agitated saline contrast was given intravenously to evaluate for intracardiac shunting. Agitated saline contrast bubble study was positive with shunting observed within 3-6 cardiac cycles suggestive of interatrial shunt. A large patent foramen ovale is detected.  LEFT VENTRICLE PLAX 2D LVIDd:         4.20 cm   Diastology LVIDs:         3.00 cm   LV e' medial:    13.70 cm/s LV PW:         0.90 cm   LV E/e' medial:  6.7 LV IVS:        0.90 cm   LV e' lateral:   16.80 cm/s LVOT diam:     2.00 cm   LV E/e' lateral: 5.5 LV SV:         57 LV SV Index:   28 LVOT Area:     3.14 cm  RIGHT VENTRICLE             IVC RV S prime:     12.50 cm/s  IVC diam: 1.40 cm TAPSE (M-mode): 1.8 cm LEFT ATRIUM             Index        RIGHT ATRIUM           Index LA diam:        3.00 cm 1.48 cm/m   RA Area:     11.10 cm LA Vol (A2C):   42.1 ml 20.70 ml/m  RA Volume:   22.40 ml  11.02 ml/m LA Vol (A4C):   41.3 ml 20.31 ml/m LA Biplane Vol:  41.7 ml 20.51 ml/m  AORTIC VALVE LVOT Vmax:   93.80 cm/s LVOT Vmean:  64.500 cm/s LVOT VTI:    0.183 m  AORTA Ao Root diam: 2.50 cm Ao Asc diam:  2.70 cm MITRAL VALVE MV Area (PHT): 3.72 cm    SHUNTS MV Decel Time: 204 msec    Systemic VTI:  0.18 m MV E velocity: 91.70 cm/s  Systemic Diam: 2.00 cm MV A velocity: 63.90 cm/s MV E/A ratio:  1.44 Stanly Leavens MD Electronically signed by Stanly Leavens MD Signature Date/Time: 06/07/2024/4:53:27 PM    Final    MR BRAIN WO CONTRAST Result Date: 06/07/2024 CLINICAL DATA:  Stroke, follow up EXAM: MRI HEAD WITHOUT CONTRAST TECHNIQUE: Multiplanar, multiecho pulse sequences of the brain and surrounding structures were obtained without intravenous contrast. COMPARISON:  CT head from earlier today. FINDINGS: Brain: No acute infarction, hemorrhage, hydrocephalus, extra-axial collection or mass lesion. Vascular: Normal flow voids. Skull and upper cervical spine: Normal marrow signal. Sinuses/Orbits: Negative. IMPRESSION: No evidence of acute intracranial abnormality. Electronically Signed   By: Gilmore GORMAN Molt M.D.   On: 06/07/2024 03:05   CT ANGIO HEAD NECK W WO CM (CODE STROKE) Result Date: 06/07/2024 CLINICAL DATA:  Neuro deficit, acute, stroke suspected EXAM: CT ANGIOGRAPHY HEAD AND NECK WITH AND WITHOUT CONTRAST TECHNIQUE: Multidetector CT imaging of the head and neck was performed using the standard protocol during bolus administration of intravenous contrast. Multiplanar CT image reconstructions and MIPs were obtained to evaluate the vascular anatomy. Carotid stenosis measurements (when applicable) are obtained utilizing NASCET criteria, using the distal internal carotid diameter as the denominator. RADIATION DOSE REDUCTION: This exam was performed according to the departmental dose-optimization program which includes automated exposure control, adjustment of the  mA and/or kV according to patient size and/or use of iterative reconstruction technique.  CONTRAST:  75mL OMNIPAQUE  IOHEXOL  350 MG/ML SOLN COMPARISON:  Same day CTA head/neck. FINDINGS: CTA NECK FINDINGS Aortic arch: Great vessel origins are patent. No significant stenosis. Right carotid system: No evidence of dissection, stenosis (50% or greater), or occlusion. Left carotid system: No evidence of dissection, stenosis (50% or greater), or occlusion. Vertebral arteries: Left dominant. No evidence of dissection, stenosis (50% or greater), or occlusion. Skeleton: No acute abnormality on limited assessment. Other neck: No acute abnormality on limited assessment. Upper chest: Visualized lung apices are Review of the MIP images confirms the above findings CTA HEAD FINDINGS Anterior circulation: Bilateral ICAs are patent without significant stenosis. Right MCA and bilateral ACAs are patent significant. M1 MCAs patent. Occlusion of a proximal M3 MCA branch (for example see series 5, image 68 and series 8, image 108). Posterior circulation: Bilateral intradural vertebral arteries, basilar artery and bilateral posterior cerebral arteries are patent without proximal hemodynamically significant stenosis. Venous sinuses: As permitted by contrast timing, patent. 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 Findings discussed with Dr. Lindzen via 1:39 a.m. Electronically Signed   By: Gilmore GORMAN Molt M.D.   On: 06/07/2024 01:50   CT HEAD CODE STROKE WO CONTRAST Result Date: 06/07/2024 CLINICAL DATA:  Code stroke.  Neuro deficit, acute, stroke suspected EXAM: CT HEAD WITHOUT CONTRAST TECHNIQUE: Contiguous axial images were obtained from the base of the skull through the vertex without intravenous contrast. RADIATION DOSE REDUCTION: This exam was performed according to the departmental dose-optimization program which includes automated exposure control, adjustment of the mA and/or kV according to patient size and/or use of iterative reconstruction  technique. COMPARISON:  CTA head/neck March 23, 2023. FINDINGS: Brain: No evidence of acute infarction, hemorrhage, hydrocephalus, extra-axial collection or mass lesion/mass effect. Vascular: No hyperdense vessel identified. Skull: No acute fracture. Sinuses/Orbits: Clear sinuses.  No acute orbital findings. Other: No mastoid effusions. ASPECTS [facility] Stroke Program Early CT Score) Total score (0-10 with 10 being normal): 10. IMPRESSION: No evidence of acute intracranial abnormality. ASPECTS is 10. Code stroke imaging results were communicated on 06/07/2024 at 1:20 am to provider Dr. Merrianne via secure text paging. Electronically Signed   By: Gilmore GORMAN Molt M.D.   On: 06/07/2024 01:20     PHYSICAL EXAM  Temp:  [98 F (36.7 C)-99.2 F (37.3 C)] 98.4 F (36.9 C) (07/08 1509) Pulse Rate:  [77-115] 78 (07/08 1509) Resp:  [13-26] 20 (07/08 1509) BP: (119-160)/(60-108) 119/60 (07/08 1509) SpO2:  [96 %-100 %] 100 % (07/08 1509) Weight:  [96.8 kg] 96.8 kg (07/08 0100)  General - Well nourished, well developed, in no apparent distress.  Ophthalmologic - fundi not visualized due to noncooperation.  Cardiovascular - Regular rhythm and rate.  Mental Status -  Level of arousal and orientation to time, place, and person were intact. Language including expression, naming, repetition, comprehension was assessed and found intact. Attention span and concentration were normal. Fund of Knowledge was assessed and was intact.  Cranial Nerves II - XII - II - Visual field intact OU. III, IV, VI - Extraocular movements intact. V - Facial sensation intact bilaterally. VII - Facial movement intact bilaterally. VIII - Hearing & vestibular intact bilaterally. X - Palate elevates symmetrically. XI - Chin turning & shoulder shrug intact bilaterally. XII - Tongue protrusion intact.  Motor Strength - The patient's strength was normal in all extremities except  right upper extremity slight drift and right  lower extremity 4/5 proximal only.  Bulk was normal and fasciculations were absent.   Motor Tone - Muscle tone was assessed at the neck and appendages and was normal.  Reflexes - The patient's reflexes were symmetrical in all extremities and she had no pathological reflexes.  Sensory - Light touch, temperature/pinprick were assessed and were symmetrical.    Coordination - The patient had normal movements in the hands with no ataxia or dysmetria.  Tremor was absent.  Gait and Station - deferred.   ASSESSMENT/PLAN [name] is a 33 y.o. female with history of migraine, gestational diabetes and hypertension admitted for right-sided weakness and slurred speech with headache developed after. No TNK given due to mild symptoms.    Stroke: Likely Small left MCA infarct not detected on MRI, embolic pattern, etiology unclear, questionable PFO related CT no acute abnormality CTA head and neck left M3 occlusion MRI no acute abnormality TCD bubble study Spencer degree 3 with Valsalva 2D Echo EF 60 to 65%, evidence of large PFO TEE pending LE venous Doppler pending LDL pending HgbA1c 5.5 UDS negative Hypercoag workup pending Lovenox  for VTE prophylaxis No antithrombotic prior to admission, now on aspirin  81 mg daily and clopidogrel  75 mg daily DAPT for 3 months and then aspirin . Patient counseled to be compliant with her antithrombotic medications Ongoing aggressive stroke risk factor management Therapy recommendations: Outpatient PT and OT Disposition: Pending  Migraine 3-4 times per year, need to lie down, goes to dark room and sleep.  Typically headache resolved after sleep Sumatriptan  as needed No prophylactic medication  Hypertension BP high on presentation Stable now Long term BP goal normotensive  Hyperlipidemia Home meds: None LDL pending, goal < 70 Try to avoid statin given childbearing age  Other Stroke Risk Factors Obesity, Body mass index is 35.51 kg/m.   Gestational diabetes Use of provera -depo  Other Active Problems   Hospital day # 0  I discussed with Dr. Danton. I spent additional 30 minutes inpatient face-to-face time with the patient and family, reviewing test results, images and medication, and discussing the diagnosis, treatment plan and potential prognosis. This patient's care requiresreview of multiple databases, neurological assessment, discussion with family, other specialists and medical decision making of high complexity.    Ary Cummins, MD PhD Stroke Neurology 06/07/2024 5:43 PM    To contact Stroke Continuity provider, please refer to [email or web address]. After hours, contact General Neurology

## 2024-06-08 ENCOUNTER — Observation Stay (HOSPITAL_BASED_OUTPATIENT_CLINIC_OR_DEPARTMENT_OTHER)

## 2024-06-08 DIAGNOSIS — I639 Cerebral infarction, unspecified: Secondary | ICD-10-CM

## 2024-06-08 DIAGNOSIS — I63412 Cerebral infarction due to embolism of left middle cerebral artery: Secondary | ICD-10-CM | POA: Diagnosis not present

## 2024-06-08 DIAGNOSIS — Q2112 Patent foramen ovale: Secondary | ICD-10-CM | POA: Diagnosis not present

## 2024-06-08 LAB — BASIC METABOLIC PANEL WITH GFR
Anion gap: 5 (ref 5–15)
BUN: 13 mg/dL (ref 6–20)
CO2: 24 mmol/L (ref 22–32)
Calcium: 8.9 mg/dL (ref 8.9–10.3)
Chloride: 108 mmol/L (ref 98–111)
Creatinine, Ser: 1.06 mg/dL — ABNORMAL HIGH (ref 0.44–1.00)
GFR, Estimated: >60 mL/min (ref >60)
Glucose, Bld: 106 mg/dL — ABNORMAL HIGH (ref 70–99)
Potassium: 3.9 mmol/L (ref 3.5–5.1)
Sodium: 137 mmol/L (ref 135–145)

## 2024-06-08 LAB — LIPID PANEL
Cholesterol: 131 mg/dL (ref 0–200)
HDL: 42 mg/dL (ref >40)
LDL Cholesterol: 77 mg/dL (ref 0–99)
Total CHOL/HDL Ratio: 3.1 ratio
Triglycerides: 62 mg/dL (ref <150)
VLDL: 12 mg/dL (ref 0–40)

## 2024-06-08 LAB — HOMOCYSTEINE: Homocysteine: 9.3 umol/L (ref 0.0–14.5)

## 2024-06-08 LAB — CBC
HCT: 36.3 % (ref 36.0–46.0)
Hemoglobin: 11.6 g/dL — ABNORMAL LOW (ref 12.0–15.0)
MCH: 25.5 pg — ABNORMAL LOW (ref 26.0–34.0)
MCHC: 32 g/dL (ref 30.0–36.0)
MCV: 79.8 fL — ABNORMAL LOW (ref 80.0–100.0)
Platelets: 233 K/uL (ref 150–400)
RBC: 4.55 MIL/uL (ref 3.87–5.11)
RDW: 13.2 % (ref 11.5–15.5)
WBC: 5 K/uL (ref 4.0–10.5)
nRBC: 0 % (ref 0.0–0.2)

## 2024-06-08 LAB — BETA-2-GLYCOPROTEIN I ABS, IGG/M/A
Beta-2 Glyco I IgG: <9 GPI IgG units (ref 0–20)
Beta-2-Glycoprotein I IgA: <9 GPI IgA units (ref 0–25)
Beta-2-Glycoprotein I IgM: <9 GPI IgM units (ref 0–32)

## 2024-06-08 NOTE — Plan of Care (Signed)
   Problem: Education: Goal: Knowledge of disease or condition will improve Outcome: Progressing Goal: Knowledge of secondary prevention will improve (MUST DOCUMENT ALL) Outcome: Progressing Goal: Knowledge of patient specific risk factors will improve (DELETE if not current risk factor) Outcome: Progressing   Problem: Ischemic Stroke/TIA Tissue Perfusion: Goal: Complications of ischemic stroke/TIA will be minimized Outcome: Progressing

## 2024-06-08 NOTE — TOC Initial Note (Signed)
 Transition of Care [facility]) - Initial/Assessment Note    Patient Details  Name: Sarah Holland MRN: [id number] Date of Birth: 08-17-1991  Transition of Care [facility]) CM/SW Contact:    Sarah JULIANNA George, RN Phone Number: 06/08/2024, 12:25 PM  Clinical Narrative:                  Pt is from home with her spouse. When he isn't home she states her mom and grandmother can be with her.  No DME at home and no new needs.  She denies issues with transportation.  She wasn't taking any prescription medications at home prior to the hospital. Outpatient therapy referral sent to [facility]. They will contact her for the first appointment. Information on the AVS.  TOC following.  Expected Discharge Plan: OP Rehab Barriers to Discharge: Continued Medical Work up   Patient Goals and CMS Choice     Choice offered to / list presented to : Patient      Expected Discharge Plan and Services   Discharge Planning Services: CM Consult   Living arrangements for the past 2 months: Single Family Home                                      Prior Living Arrangements/Services Living arrangements for the past 2 months: Single Family Home Lives with:: Spouse Patient language and need for interpreter reviewed:: Yes Do you feel safe going back to the place where you live?: Yes        Care giver support system in place?: Yes (comment)   Criminal Activity/Legal Involvement Pertinent to Current Situation/Hospitalization: No - Comment as needed  Activities of Daily Living      Permission Sought/Granted                  Emotional Assessment Appearance:: Appears stated age Attitude/Demeanor/Rapport: Engaged Affect (typically observed): Accepting Orientation: : Oriented to Self, Oriented to Place, Oriented to Situation, Oriented to  Time   Psych Involvement: No (comment)  Admission diagnosis:  Acute cerebrovascular accident (CVA) (HCC) [I63.9] Cerebrovascular accident (CVA), unspecified  mechanism (HCC) [I63.9] Patient Active Problem List   Diagnosis Date Noted   Acute cerebrovascular accident (CVA) (HCC) 06/07/2024   Migraines 06/07/2024   Dysplasia of cervix, 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 [place] [address] Phone: [phone] Fax: [phone]     Social Drivers of Health (SDOH) Social History: SDOH Screenings   Food Insecurity: No Food Insecurity (06/07/2024)  Housing: Low Risk  (06/07/2024)  Transportation Needs: No Transportation Needs (06/07/2024)  Utilities: Not At Risk (06/07/2024)  Depression (PHQ2-9): Low Risk  (05/01/2022)  Financial Resource Strain: Low Risk  (03/24/2024)   Received from [facility]  Tobacco Use: Medium Risk (06/07/2024)   SDOH Interventions:     Readmission Risk Interventions     No data to display

## 2024-06-08 NOTE — Hospital Course (Addendum)
 33 y.o. year old female with past medical history of gestational diabetes, gestational hypertension, migraines, iron deficiency anemia, and vitamin d deficiency presented to ED with sudden onset of right-sided weakness at work and slurred speech with headache that developed after.  Seen as a code stroke in the ED, no TNK given due to mild symptoms.  Neurology was consulted and admitted for further workup. Found to have acute stroke with a small left MCA infarct, complete stroke workup TEE showed large PFO and referred to structural cardiology for closure.  Hypercoagulable work up showed protein C, S deficiency and oncology was consulted.  Subjective: Seen and examined this morning resting comfortably was waiting for TEE no new complaints  Discharge diagnosis:  Acute stroke likely small left MCA infarct not detected on MRI, etiology embolic pattern but unclear questionable PFO related: Appreciate neurology input, continue stroke workup as below CT head/CTA head and neck: No acute intracranial finding but would left M3 occlusion MRI no acute finding 2D echo EF 60-55% evidence of large PFO, TCD bubble study Spencer degree 3 with Valsalva.  TEE showed large PFO and referred to structural cardiology for closure.  Hypercoagulable work up showed protein C, S deficiency and oncology was consulted-Case discussed with Dr. Lonn who has advised outpatient follow-up and is scheduled for Monday. A1c 5.5 stable, LDL 77 goal less than 70-per neurology avoid statin given childbearing age No antithrombotic prior to admission now on aspirin  74 and Plavix  75, to continue until seen by cardiology   Mgraine: Continue sumatriptan  as needed  Hypertension: High on presentation currently stable, not on meds.  Optimize for normotension for long-term  Hyperlipidemia: LDL 77 goal is 70, see above.

## 2024-06-08 NOTE — Progress Notes (Signed)
 PROGRESS NOTE RUNELL KOVICH  FMW:[id number] DOB: Jul 23, 1991 DOA: 06/07/2024 PCP: Sherial Bail, MD  Brief Narrative/Hospital Course: 33 y.o. year old female with past medical history of gestational diabetes, gestational hypertension, migraines, iron deficiency anemia, and vitamin d deficiency presented to ED with sudden onset of right-sided weakness at work and slurred speech with headache that developed after.  Seen as a code stroke in the ED, no TNK given due to mild symptoms.  Neurology was consulted and admitted for further workup  Subjective: Patient seen and examined Overnight Afebrile, BP stable Intermittent right-sided numbness present comes and goes No new focal weakness  Assessment and plan:  Acute stroke likely small left MCA infarct not detected on MRI, etiology embolic pattern but unclear questionable PFO related: Appreciate neurology input, continue stroke workup as below CT head/CTA head and neck: No acute intracranial finding but would left M3 occlusion MRI no acute finding 2D echo EF 60-55% evidence of large PFO, TCD bubble study Spencer degree 3 with Valsalva. TEE pending today or tomorrow Duplex leg pending A1c 5.5 stable, LDL 77 goal less than 70-per neurology avoid statin given childbearing age Neurology following follow-up hypercoagulable workup. No antithrombotic prior to admission now on aspirin  81 and Plavix  75, to continue x 3 months then aspirin  alone  Migraine: Continue sumatriptan  as needed  Hypertension: High on presentation currently stable, not on meds.  Optimize for normotension for long-term  Hyperlipidemia: LDL 77 goal is 70, see above   Class I Obesity w/ Body mass index is 35.51 kg/m.: Will benefit with PCP follow-up, weight loss,healthy lifestyle and outpatient sleep eval if not done.  DVT prophylaxis: enoxaparin  (LOVENOX ) injection 40 mg Start: 06/07/24 1000 Code Status:   Code Status: Full Code Family Communication: plan of care  discussed with patient at bedside. Patient status is: Remains hospitalized because of severity of illness Level of care: Telemetry Medical   Dispo: The patient is from: home            Anticipated disposition: TBD Objective: Vitals last 24 hrs: Vitals:   06/07/24 2012 06/07/24 2334 06/08/24 0417 06/08/24 0820  BP: 115/64 (!) 148/82 108/61 118/70  Pulse: 82 72 65 72  Resp: 18 19 16 18   Temp: (!) 97.5 F (36.4 C) 99.4 F (37.4 C) 98.7 F (37.1 C) 98.6 F (37 C)  TempSrc: Oral Oral Oral Oral  SpO2: 100% 100% 100% 98%  Weight:      Height:        Physical Examination: General exam: alert awake, older than stated age HEENT:Oral mucosa moist, Ear/Nose WNL grossly Respiratory system: Bilaterally clear BS, no use of accessory muscle Cardiovascular system: S1 & S2 +. Gastrointestinal system: Abdomen soft,  NT,ND,BS+ Nervous System: Alert, awake, following commands. Extremities: LE edema neg, warm extremities Skin: No rashes,warm. MSK: Normal muscle bulk/tone.   Data Reviewed: I have personally reviewed following labs and imaging studies ( see epic result tab) CBC: Recent Labs  Lab 06/07/24 0109 06/08/24 0330  WBC 6.3 5.0  NEUTROABS 1.9  --   HGB 13.3  15.0 11.6*  HCT 42.7  44.0 36.3  MCV 82.0 79.8*  PLT 274 233   CMP: Recent Labs  Lab 06/07/24 0109 06/08/24 0330  NA 140  143 137  K 3.7  3.5 3.9  CL 108  109 108  CO2 20* 24  GLUCOSE 92  92 106*  BUN 16  13 13   CREATININE 1.01*  0.90 1.06*  CALCIUM 9.7 8.9   GFR: Estimated Creatinine  Clearance: 86.1 mL/min (A) (by C-G formula based on SCr of 1.06 mg/dL (H)). Recent Labs  Lab 06/07/24 0109  AST 22  ALT 26  ALKPHOS 39  BILITOT 0.5  PROT 7.4  ALBUMIN 4.3   No results for input(s): LIPASE, AMYLASE in the last 168 hours. No results for input(s): AMMONIA  in the last 168 hours. Coagulation Profile:  Recent Labs  Lab 06/07/24 0109  INR 1.0   Unresulted Labs (From admission, onward)     Start      Ordered   06/08/24 0500  ANA w/Reflex if Positive  Tomorrow morning,   R        06/07/24 0901   06/07/24 0902  Protein C activity  (Hypercoagulable Panel, Comprehensive (PNL))  Once,   R        06/07/24 0901   06/07/24 0902  Protein C, total  (Hypercoagulable Panel, Comprehensive (PNL))  Once,   R        06/07/24 0901   06/07/24 0902  Protein S activity  (Hypercoagulable Panel, Comprehensive (PNL))  Once,   R        06/07/24 0901   06/07/24 0902  Protein S, total  (Hypercoagulable Panel, Comprehensive (PNL))  Once,   R        06/07/24 0901   06/07/24 0902  Lupus anticoagulant panel  (Hypercoagulable Panel, Comprehensive (PNL))  Once,   R        06/07/24 0901   06/07/24 0902  Beta-2 -glycoprotein i abs, IgG/M/A  (Hypercoagulable Panel, Comprehensive (PNL))  Once,   R        06/07/24 0901   06/07/24 0902  Homocysteine, serum  (Hypercoagulable Panel, Comprehensive (PNL))  Once,   R        06/07/24 0901   06/07/24 0902  Factor 5 leiden  (Hypercoagulable Panel, Comprehensive (PNL))  Once,   R        06/07/24 0901   06/07/24 0902  Prothrombin gene mutation  (Hypercoagulable Panel, Comprehensive (PNL))  Once,   R        06/07/24 0901   06/07/24 0902  Cardiolipin antibodies, IgG, IgM, IgA  (Hypercoagulable Panel, Comprehensive (PNL))  Once,   R        06/07/24 0901           Antimicrobials/Microbiology: Anti-infectives (From admission, onward)    None         Component Value Date/Time   SDES  05/13/2023 2148    URINE, CLEAN CATCH Performed at [facility], [address] [place] [place], [place] [address]    [facility]  05/13/2023 2148    NONE Performed at [facility], [address] [place], [place] [address]    CULT >=100,000 COLONIES/mL KLEBSIELLA PNEUMONIAE (A) 05/13/2023 2148   REPTSTATUS 05/16/2023 FINAL 05/13/2023 2148    Procedures: Procedure(s) (LRB): TRANSESOPHAGEAL ECHOCARDIOGRAM (N/A) Medications reviewed:  Scheduled Meds:   stroke: early  stages of recovery book   Does not apply Once   aspirin  EC  81 mg Oral Daily   clopidogrel   75 mg Oral Daily   enoxaparin  (LOVENOX ) injection  40 mg Subcutaneous Q24H   sodium chloride  flush  3-10 mL Intravenous Q12H   Continuous Infusions:  Mennie LAMY, MD [facility] 06/08/2024, 10:49 AM

## 2024-06-08 NOTE — Progress Notes (Signed)
 STROKE TEAM PROGRESS NOTE   SUBJECTIVE (INTERVAL HISTORY) Her mom and sister are at the bedside.  Overall her condition is stable. She still has mild right sided weakness but otherwise symptoms resolved.  Pending further stroke workup including TEE.   OBJECTIVE Temp:  [97.5 F (36.4 C)-99.4 F (37.4 C)] 98.4 F (36.9 C) (07/09 1136) Pulse Rate:  [65-82] 78 (07/09 1136) Cardiac Rhythm: Normal sinus rhythm (07/09 0700) Resp:  [16-19] 18 (07/09 1136) BP: (108-148)/(61-82) 128/82 (07/09 1136) SpO2:  [98 %-100 %] 100 % (07/09 1136)  Recent Labs  Lab 06/07/24 0103  GLUCAP 106*   Recent Labs  Lab 06/07/24 0109 06/08/24 0330  NA 140  143 137  K 3.7  3.5 3.9  CL 108  109 108  CO2 20* 24  GLUCOSE 92  92 106*  BUN 16  13 13   CREATININE 1.01*  0.90 1.06*  CALCIUM 9.7 8.9   Recent Labs  Lab 06/07/24 0109  AST 22  ALT 26  ALKPHOS 39  BILITOT 0.5  PROT 7.4  ALBUMIN 4.3   Recent Labs  Lab 06/07/24 0109 06/08/24 0330  WBC 6.3 5.0  NEUTROABS 1.9  --   HGB 13.3  15.0 11.6*  HCT 42.7  44.0 36.3  MCV 82.0 79.8*  PLT 274 233   No results for input(s): CKTOTAL, CKMB, CKMBINDEX, TROPONINI in the last 168 hours. Recent Labs    06/07/24 0109  LABPROT 13.5  INR 1.0   No results for input(s): COLORURINE, LABSPEC, PHURINE, GLUCOSEU, HGBUR, BILIRUBINUR, KETONESUR, PROTEINUR, UROBILINOGEN, NITRITE, LEUKOCYTESUR in the last 72 hours.  Invalid input(s): APPERANCEUR     Component Value Date/Time   CHOL 131 06/08/2024 0330   TRIG 62 06/08/2024 0330   HDL 42 06/08/2024 0330   CHOLHDL 3.1 06/08/2024 0330   VLDL 12 06/08/2024 0330   LDLCALC 77 06/08/2024 0330   Lab Results  Component Value Date   HGBA1C 5.5 06/07/2024      Component Value Date/Time   LABOPIA NONE DETECTED 06/07/2024 0442   COCAINSCRNUR NONE DETECTED 06/07/2024 0442   LABBENZ NONE DETECTED 06/07/2024 0442   AMPHETMU NONE DETECTED 06/07/2024 0442   THCU NONE  DETECTED 06/07/2024 0442   LABBARB NONE DETECTED 06/07/2024 0442    Recent Labs  Lab 06/07/24 0109  ETH <15    I have personally reviewed the radiological images below and agree with the radiology interpretations.  VAS US  LOWER EXTREMITY VENOUS (DVT) Result Date: 06/08/2024  Lower Venous DVT Study Patient Name:  LOVEAH LIKE  Date of Exam:   06/08/2024 Medical Rec #: [id number]         Accession #:    [id number] Date of Birth: 01-Jul-1991         Patient Gender: F Patient Age:   33 years Exam Location:  [facility] Procedure:      VAS US  LOWER EXTREMITY VENOUS (DVT) Referring Phys: ARY Seeley [place] --------------------------------------------------------------------------------  Indications: Stroke, and Small PFO.  Comparison Study: No priors. Performing Technologist: Ricka Sturdivant-Jones RDMS, RVT  Examination Guidelines: A complete evaluation includes B-mode imaging, spectral Doppler, color Doppler, and power Doppler as needed of all accessible portions of each vessel. Bilateral testing is considered an integral part of a complete examination. Limited examinations for reoccurring indications may be performed as noted. The reflux portion of the exam is performed with the patient in reverse Trendelenburg.  +---------+---------------+---------+-----------+----------+--------------+ RIGHT    CompressibilityPhasicitySpontaneityPropertiesThrombus Aging +---------+---------------+---------+-----------+----------+--------------+ CFV      Full  Yes      Yes                                 +---------+---------------+---------+-----------+----------+--------------+ SFJ      Full                                                        +---------+---------------+---------+-----------+----------+--------------+ FV Prox  Full                                                        +---------+---------------+---------+-----------+----------+--------------+ FV Mid   Full                                                         +---------+---------------+---------+-----------+----------+--------------+ FV DistalFull                                                        +---------+---------------+---------+-----------+----------+--------------+ PFV      Full                                                        +---------+---------------+---------+-----------+----------+--------------+ POP      Full           Yes      Yes                                 +---------+---------------+---------+-----------+----------+--------------+ PTV      Full                                                        +---------+---------------+---------+-----------+----------+--------------+ PERO     Full                                                        +---------+---------------+---------+-----------+----------+--------------+   +---------+---------------+---------+-----------+----------+--------------+ LEFT     CompressibilityPhasicitySpontaneityPropertiesThrombus Aging +---------+---------------+---------+-----------+----------+--------------+ CFV      Full           Yes      Yes                                 +---------+---------------+---------+-----------+----------+--------------+ SFJ  Full                                                        +---------+---------------+---------+-----------+----------+--------------+ FV Prox  Full                                                        +---------+---------------+---------+-----------+----------+--------------+ FV Mid   Full                                                        +---------+---------------+---------+-----------+----------+--------------+ FV DistalFull                                                        +---------+---------------+---------+-----------+----------+--------------+ PFV      Full                                                         +---------+---------------+---------+-----------+----------+--------------+ POP      Full           Yes      Yes                                 +---------+---------------+---------+-----------+----------+--------------+ PTV      Full                                                        +---------+---------------+---------+-----------+----------+--------------+     Summary: BILATERAL: - No evidence of deep vein thrombosis seen in the lower extremities, bilaterally. -No evidence of popliteal cyst, bilaterally.   *See table(s) above for measurements and observations.    Preliminary    VAS US  TRANSCRANIAL DOPPLER W BUBBLES Result Date: 06/07/2024  Transcranial Doppler with Bubble Patient Name:  KAMALEI ROEDER  Date of Exam:   06/07/2024 Medical Rec #: [id number]         Accession #:    [id number] Date of Birth: 1991/09/10         Patient Gender: F Patient Age:   27 years Exam Location:  [facility] Procedure:      VAS US  TRANSCRANIAL DOPPLER W BUBBLES Referring Phys: ARY Vannia Pola --------------------------------------------------------------------------------  Indications: Stroke. Comparison Study: No previous exams Performing Technologist: Jody Hill RVT, RDMS  Examination Guidelines: A complete evaluation includes B-mode imaging, spectral Doppler, color Doppler, and power Doppler as needed of all accessible portions of each vessel. Bilateral testing is considered an integral part of a complete examination.  Limited examinations for reoccurring indications may be performed as noted.  Summary:  A vascular evaluation was performed. The left middle cerebral artery was studied. An IV was inserted into the patient's left forearm. Verbal informed consent was obtained.  Less than 10 HITS detected at rest, indicating a Spencer Grade 1 PFO. With valsalva, between 30-100 HITs detected, indicating a Spencer Grade 3 PFO. clinical correlation recommended *See table(s) above for TCD measurements and  observations.  Diagnosing physician: Ary Cummins MD Electronically signed by Ary Cummins MD on 06/07/2024 at 5:32:47 PM.    Final    ECHOCARDIOGRAM COMPLETE Result Date: 06/07/2024    ECHOCARDIOGRAM REPORT   Patient Name:   SIMRAH CHATHAM Date of Exam: 06/07/2024 Medical Rec #:  [id number]        Height:       65.0 in Accession #:    [id number]       Weight:       213.4 lb Date of Birth:  24-Dec-1990        BSA:          2.033 m Patient Age:    32 years         BP:           119/60 mmHg Patient Gender: F                HR:           71 bpm. Exam Location:  Inpatient Procedure: 2D Echo, Cardiac Doppler and Color Doppler (Both Spectral and Color            Flow Doppler were utilized during procedure). Indications:    Stroke  History:        Patient has no prior history of Echocardiogram examinations.  Sonographer:    Philomena Daring Referring Phys: [id number] KATY L FOUST IMPRESSIONS  1. Left ventricular ejection fraction, by estimation, is 60 to 65%. The left ventricle has normal function. The left ventricle has no regional wall motion abnormalities. Left ventricular diastolic parameters were normal.  2. Right ventricular systolic function is normal. The right ventricular size is normal.  3. The mitral valve is normal in structure. No evidence of mitral valve regurgitation. No evidence of mitral stenosis.  4. The aortic valve is tricuspid. Aortic valve regurgitation is not visualized. No aortic stenosis is present.  5. The inferior vena cava is normal in size with greater than 50% respiratory variability, suggesting right atrial pressure of 3 mmHg.  6. Evidence of atrial level shunting detected by color flow Doppler. Agitated saline contrast bubble study was positive with shunting observed within 3-6 cardiac cycles suggestive of interatrial shunt. There is a large patent foramen ovale. Comparison(s): No prior Echocardiogram. FINDINGS  Left Ventricle: Left ventricular ejection fraction, by estimation, is 60 to 65%. The left  ventricle has normal function. The left ventricle has no regional wall motion abnormalities. The left ventricular internal cavity size was normal in size. There is  no left ventricular hypertrophy. Left ventricular diastolic parameters were normal. Right Ventricle: The right ventricular size is normal. No increase in right ventricular wall thickness. Right ventricular systolic function is normal. Left Atrium: Left atrial size was normal in size. Right Atrium: Right atrial size was normal in size. Pericardium: There is no evidence of pericardial effusion. Mitral Valve: The mitral valve is normal in structure. No evidence of mitral valve regurgitation. No evidence of mitral valve stenosis. Tricuspid Valve: The tricuspid valve is normal in structure. Tricuspid valve regurgitation  is not demonstrated. No evidence of tricuspid stenosis. Aortic Valve: The aortic valve is tricuspid. Aortic valve regurgitation is not visualized. No aortic stenosis is present. Pulmonic Valve: The pulmonic valve was normal in structure. Pulmonic valve regurgitation is mild. No evidence of pulmonic stenosis. Aorta: The aortic root and ascending aorta are structurally normal, with no evidence of dilitation, the aortic root is normal in size and structure and the aortic root, ascending aorta and aortic arch are all structurally normal, with no evidence of dilitation or obstruction. Venous: The inferior vena cava is normal in size with greater than 50% respiratory variability, suggesting right atrial pressure of 3 mmHg. IAS/Shunts: Evidence of atrial level shunting detected by color flow Doppler. Agitated saline contrast was given intravenously to evaluate for intracardiac shunting. Agitated saline contrast bubble study was positive with shunting observed within 3-6 cardiac cycles suggestive of interatrial shunt. A large patent foramen ovale is detected.  LEFT VENTRICLE PLAX 2D LVIDd:         4.20 cm   Diastology LVIDs:         3.00 cm   LV e'  medial:    13.70 cm/s LV PW:         0.90 cm   LV E/e' medial:  6.7 LV IVS:        0.90 cm   LV e' lateral:   16.80 cm/s LVOT diam:     2.00 cm   LV E/e' lateral: 5.5 LV SV:         57 LV SV Index:   28 LVOT Area:     3.14 cm  RIGHT VENTRICLE             IVC RV S prime:     12.50 cm/s  IVC diam: 1.40 cm TAPSE (M-mode): 1.8 cm LEFT ATRIUM             Index        RIGHT ATRIUM           Index LA diam:        3.00 cm 1.48 cm/m   RA Area:     11.10 cm LA Vol (A2C):   42.1 ml 20.70 ml/m  RA Volume:   22.40 ml  11.02 ml/m LA Vol (A4C):   41.3 ml 20.31 ml/m LA Biplane Vol: 41.7 ml 20.51 ml/m  AORTIC VALVE LVOT Vmax:   93.80 cm/s LVOT Vmean:  64.500 cm/s LVOT VTI:    0.183 m  AORTA Ao Root diam: 2.50 cm Ao Asc diam:  2.70 cm MITRAL VALVE MV Area (PHT): 3.72 cm    SHUNTS MV Decel Time: 204 msec    Systemic VTI:  0.18 m MV E velocity: 91.70 cm/s  Systemic Diam: 2.00 cm MV A velocity: 63.90 cm/s MV E/A ratio:  1.44 Stanly Leavens MD Electronically signed by Stanly Leavens MD Signature Date/Time: 06/07/2024/4:53:27 PM    Final    MR BRAIN WO CONTRAST Result Date: 06/07/2024 CLINICAL DATA:  Stroke, follow up EXAM: MRI HEAD WITHOUT CONTRAST TECHNIQUE: Multiplanar, multiecho pulse sequences of the brain and surrounding structures were obtained without intravenous contrast. COMPARISON:  CT head from earlier today. FINDINGS: Brain: No acute infarction, hemorrhage, hydrocephalus, extra-axial collection or mass lesion. Vascular: Normal flow voids. Skull and upper cervical spine: Normal marrow signal. Sinuses/Orbits: Negative. IMPRESSION: No evidence of acute intracranial abnormality. Electronically Signed   By: Gilmore GORMAN Molt M.D.   On: 06/07/2024 03:05   CT ANGIO HEAD NECK W WO CM (CODE  STROKE) Result Date: 06/07/2024 CLINICAL DATA:  Neuro deficit, acute, stroke suspected EXAM: CT ANGIOGRAPHY HEAD AND NECK WITH AND WITHOUT CONTRAST TECHNIQUE: Multidetector CT imaging of the head and neck was performed using  the standard protocol during bolus administration of intravenous contrast. Multiplanar CT image reconstructions and MIPs were obtained to evaluate the vascular anatomy. Carotid stenosis measurements (when applicable) are obtained utilizing NASCET criteria, using the distal internal carotid diameter as the denominator. RADIATION DOSE REDUCTION: This exam was performed according to the departmental dose-optimization program which includes automated exposure control, adjustment of the mA and/or kV according to patient size and/or use of iterative reconstruction technique. CONTRAST:  75mL OMNIPAQUE  IOHEXOL  350 MG/ML SOLN COMPARISON:  Same day CTA head/neck. FINDINGS: CTA NECK FINDINGS Aortic arch: Great vessel origins are patent. No significant stenosis. Right carotid system: No evidence of dissection, stenosis (50% or greater), or occlusion. Left carotid system: No evidence of dissection, stenosis (50% or greater), or occlusion. Vertebral arteries: Left dominant. No evidence of dissection, stenosis (50% or greater), or occlusion. Skeleton: No acute abnormality on limited assessment. Other neck: No acute abnormality on limited assessment. Upper chest: Visualized lung apices are Review of the MIP images confirms the above findings CTA HEAD FINDINGS Anterior circulation: Bilateral ICAs are patent without significant stenosis. Right MCA and bilateral ACAs are patent significant. M1 MCAs patent. Occlusion of a proximal M3 MCA branch (for example see series 5, image 68 and series 8, image 108). Posterior circulation: Bilateral intradural vertebral arteries, basilar artery and bilateral posterior cerebral arteries are patent without proximal hemodynamically significant stenosis. Venous sinuses: As permitted by contrast timing, patent. 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 Findings discussed with Dr. Lindzen via 1:39 a.m. Electronically Signed   By:  Gilmore GORMAN Molt M.D.   On: 06/07/2024 01:50   CT HEAD CODE STROKE WO CONTRAST Result Date: 06/07/2024 CLINICAL DATA:  Code stroke.  Neuro deficit, acute, stroke suspected EXAM: CT HEAD WITHOUT CONTRAST TECHNIQUE: Contiguous axial images were obtained from the base of the skull through the vertex without intravenous contrast. RADIATION DOSE REDUCTION: This exam was performed according to the departmental dose-optimization program which includes automated exposure control, adjustment of the mA and/or kV according to patient size and/or use of iterative reconstruction technique. COMPARISON:  CTA head/neck March 23, 2023. FINDINGS: Brain: No evidence of acute infarction, hemorrhage, hydrocephalus, extra-axial collection or mass lesion/mass effect. Vascular: No hyperdense vessel identified. Skull: No acute fracture. Sinuses/Orbits: Clear sinuses.  No acute orbital findings. Other: No mastoid effusions. ASPECTS [facility] Stroke Program Early CT Score) Total score (0-10 with 10 being normal): 10. IMPRESSION: No evidence of acute intracranial abnormality. ASPECTS is 10. Code stroke imaging results were communicated on 06/07/2024 at 1:20 am to provider Dr. Merrianne via secure text paging. Electronically Signed   By: Gilmore GORMAN Molt M.D.   On: 06/07/2024 01:20     PHYSICAL EXAM  Temp:  [97.5 F (36.4 C)-99.4 F (37.4 C)] 98.4 F (36.9 C) (07/09 1136) Pulse Rate:  [65-82] 78 (07/09 1136) Resp:  [16-19] 18 (07/09 1136) BP: (108-148)/(61-82) 128/82 (07/09 1136) SpO2:  [98 %-100 %] 100 % (07/09 1136)  General - Well nourished, well developed, in no apparent distress.  Ophthalmologic - fundi not visualized due to noncooperation.  Cardiovascular - Regular rhythm and rate.  Mental Status -  Level of arousal and orientation to time, place, and person were intact. Language including expression, naming, repetition, comprehension was assessed  and found intact. Attention span and concentration were normal. Fund  of Knowledge was assessed and was intact.  Cranial Nerves II - XII - II - Visual field intact OU. III, IV, VI - Extraocular movements intact. V - Facial sensation intact bilaterally. VII - Facial movement intact bilaterally. VIII - Hearing & vestibular intact bilaterally. X - Palate elevates symmetrically. XI - Chin turning & shoulder shrug intact bilaterally. XII - Tongue protrusion intact.  Motor Strength - The patient's strength was normal in all extremities except right upper extremity slight drift and right lower extremity 4/5 proximal only.  Bulk was normal and fasciculations were absent.   Motor Tone - Muscle tone was assessed at the neck and appendages and was normal.  Reflexes - The patient's reflexes were symmetrical in all extremities and she had no pathological reflexes.  Sensory - Light touch, temperature/pinprick were assessed and were symmetrical.    Coordination - The patient had normal movements in the hands with no ataxia or dysmetria.  Tremor was absent.  Gait and Station - deferred.   ASSESSMENT/PLAN Ms. TANEAH MASRI is a 33 y.o. female with history of migraine, gestational diabetes and hypertension admitted for right-sided weakness and slurred speech with headache developed after. No TNK given due to mild symptoms.    Stroke: Likely Small left MCA infarct not detected on MRI, embolic pattern, etiology unclear, questionable PFO related CT no acute abnormality CTA head and neck left M3 occlusion MRI no acute abnormality TCD bubble study Spencer degree 3 with Valsalva 2D Echo EF 60 to 65%, evidence of large PFO TEE pending LE venous Doppler no DVT LDL 77 HgbA1c 5.5 UDS negative Hypercoag workup pending Lovenox  for VTE prophylaxis No antithrombotic prior to admission, now on aspirin  81 mg daily and clopidogrel  75 mg daily DAPT for 3 months and then aspirin . Patient counseled to be compliant with her antithrombotic medications Ongoing aggressive stroke  risk factor management Therapy recommendations: Outpatient PT and OT Disposition: Pending  Migraine 3-4 times per year, need to lie down, goes to dark room and sleep.  Typically headache resolved after sleep Sumatriptan  as needed No prophylactic medication  Hypertension BP high on presentation Stable now Long term BP goal normotensive  Hyperlipidemia Home meds: None LDL 77, goal < 70 No statin for now given childbearing age and LDL not far from goal  Other Stroke Risk Factors Obesity, Body mass index is 35.51 kg/m.  Gestational diabetes Use of provera -depo  Other Active Problems   Hospital day # 0    Ary Cummins, MD PhD Stroke Neurology 06/08/2024 3:10 PM    To contact Stroke Continuity provider, please refer to [email or web address]. After hours, contact General Neurology

## 2024-06-08 NOTE — Evaluation (Addendum)
 Speech Language Pathology Evaluation Patient Details Name: Sarah Holland MRN: [id number] DOB: 1991/07/14 Today's Date: 06/08/2024 Time: 1155-1208 SLP Time Calculation (min) (ACUTE ONLY): 13 min  Problem List:  Patient Active Problem List   Diagnosis Date Noted   Acute cerebrovascular accident (CVA) (HCC) 06/07/2024   Migraines 06/07/2024   Dysplasia of cervix, low grade (CIN 1) 05/21/2023   Pyelonephritis 03/24/2023   Acute pyelonephritis 03/23/2023   AKI (acute kidney injury) (HCC) 03/23/2023   Back pain 03/23/2023   Obesity (BMI 30-39.9) 08/30/2020   Normal vaginal delivery 09/28/2019   History of gestational diabetes 08/02/2019   SIRS (systemic inflammatory response syndrome) (HCC) 05/10/2015   Past Medical History:  Past Medical History:  Diagnosis Date   Elevated blood pressure complicating pregnancy in third trimester, antepartum 09/27/2019   Encounter for induction of labor 09/28/2019   Family history of pancreatic cancer    8/23 cancer genetic testing letter sent   Gestational diabetes    Gestational hypertension 09/28/2019   Obesity (BMI 30-39.9)    Obesity complicating peripregnancy, antepartum 08/09/2019   Supervision of high risk pregnancy, antepartum 04/05/2019   Clinic [facility] Prenatal Labs Dating LMP Blood type: B/Positive/-- (04/22 1503)  Genetic Screen NIPS: Negative XY Antibody:Negative (04/22 1503) Anatomic US  Complete 05/16/2019 Rubella: 3.96 (04/22 1503) Varicella: Immune GTT Early:               Third trimester: 204; 3 hr 98/219/204/140- RPR: Non Reactive (04/22 1503)  Rhogam N/A HBsAg: Negative (04/22 1503)  TDaP vaccine        08/02/19               Past Surgical History:  Past Surgical History:  Procedure Laterality Date   APPENDECTOMY     HPI:  33 year old female who presents ER 06/07/24 as a code stroke secondary to right-sided weakness. NIHSS=4; CT head negative; MRI brain no acute abnormality. 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  PMH-migraines, gestational diabetes, anemia.   Assessment / Plan / Recommendation Clinical Impression  Pt lives with husband, 3 children and worked at the [employer]. She stated last night she was unable to talk but today reports her language is at baseline and states no difficulty with cognition. She was able to converse with SLP fluently without any hesitations or dysnomia with 100% intelligible speech. Her processing is within normal limits and was given the SLUMS which she scored a 25/30 which is 2 points below what is considered normal. Points lost were with clock drawing with the minute hand shorter than the hour hand, 4 digit backward repetitionand one question with paragraph recall. Discussed results with pt and she does not feel she will have difficulty managing finances or ADL's at home. SLP in agreement that overall she appears safe with adequate judgement and further ST in hospital or at discharge is needed at this time.    SLP Assessment  SLP Recommendation/Assessment: Patient does not need any further Speech Language Pathology Services SLP Visit Diagnosis: Cognitive communication deficit (R41.841)     Assistance Recommended at Discharge  None  Functional Status Assessment Patient has not had a recent decline in their functional status  Frequency and Duration           SLP Evaluation Cognition  Overall Cognitive Status: Within Functional Limits for tasks assessed (scored 25/30 on SLUMS- see impression statement) Arousal/Alertness: Awake/alert Orientation Level: Oriented to person;Oriented to place;Oriented to time;Oriented to situation Year: 2025 Day of Week: Correct Attention: Sustained Sustained  Attention: Appears intact Memory: Appears intact Awareness: Appears intact Problem Solving: Appears intact Safety/Judgment: Appears intact       Comprehension  Auditory Comprehension Overall Auditory Comprehension: Appears within functional limits for tasks assessed Visual  Recognition/Discrimination Discrimination: Not tested Reading Comprehension Reading Status: Not tested    Expression Expression Primary Mode of Expression: Verbal Verbal Expression Overall Verbal Expression: Appears within functional limits for tasks assessed Initiation: No impairment Level of Generative/Spontaneous Verbalization: Conversation Repetition:  (NT) Naming: No impairment Pragmatics: No impairment Written Expression Dominant Hand: Right Written Expression: Not tested   Oral / Motor  Oral Motor/Sensory Function Overall Oral Motor/Sensory Function: Within functional limits Motor Speech Overall Motor Speech: Appears within functional limits for tasks assessed Respiration: Within functional limits Phonation: Normal Resonance: Within functional limits Articulation: Within functional limitis Intelligibility: Intelligible Motor Planning: Within functional limits Motor Speech Errors: Not applicable            Dustin Olam Bull 06/08/2024, 12:29 PM

## 2024-06-08 NOTE — Care Management Obs Status (Signed)
 MEDICARE OBSERVATION STATUS NOTIFICATION   Patient Details  Name: Sarah Holland MRN: [id number] Date of Birth: Oct 11, 1991   Medicare Observation Status Notification Given:  Yes    Meline Russaw 06/08/2024, 2:26 PM

## 2024-06-08 NOTE — Care Management Obs Status (Signed)
 MEDICARE OBSERVATION STATUS NOTIFICATION   Patient Details  Name: Sarah Holland MRN: [id number] Date of Birth: 16-Mar-1991   Medicare Observation Status Notification Given:     Letter signed and copy given  Claretta Deed 06/08/2024, 11:15 AM

## 2024-06-08 NOTE — Progress Notes (Signed)
 Venous duplex lower ext  has been completed. Refer to [facility] under chart review to view preliminary results.   06/08/2024  12:10 PM Sarah Holland, [name]

## 2024-06-08 NOTE — Care Management Obs Status (Signed)
 MEDICARE OBSERVATION STATUS NOTIFICATION   Patient Details  Name: Sarah Holland MRN: [id number] Date of Birth: [birth year]   Medicare Observation Status Notification Given:       Claretta Deed 06/08/2024, 2:25 PM

## 2024-06-08 NOTE — Progress Notes (Signed)
 Pt admitted for acute CVA, reports tingling and numbness in right hand. Strength 4/5. Looks like this has been an intermittent complaint for pt since admitted, however this is the first report of this during my assessments. NIH-1 for mild sensory loss. No other deficits noted. V/S 98.7-65-16-108/61 100% RA  Dr. Lindzen made aware. Upon reassessment, pt reports no tingling, reports it comes and goes.

## 2024-06-09 ENCOUNTER — Other Ambulatory Visit (HOSPITAL_COMMUNITY): Payer: Self-pay

## 2024-06-09 ENCOUNTER — Observation Stay (HOSPITAL_COMMUNITY)

## 2024-06-09 ENCOUNTER — Observation Stay (HOSPITAL_COMMUNITY): Admitting: Anesthesiology

## 2024-06-09 ENCOUNTER — Other Ambulatory Visit: Payer: Self-pay | Admitting: Neurology

## 2024-06-09 ENCOUNTER — Encounter (HOSPITAL_COMMUNITY): Payer: Self-pay | Admitting: Cardiovascular Disease

## 2024-06-09 ENCOUNTER — Encounter (HOSPITAL_COMMUNITY)

## 2024-06-09 DIAGNOSIS — D6859 Other primary thrombophilia: Secondary | ICD-10-CM | POA: Diagnosis not present

## 2024-06-09 DIAGNOSIS — I639 Cerebral infarction, unspecified: Secondary | ICD-10-CM | POA: Diagnosis not present

## 2024-06-09 DIAGNOSIS — I10 Essential (primary) hypertension: Secondary | ICD-10-CM

## 2024-06-09 DIAGNOSIS — I480 Paroxysmal atrial fibrillation: Secondary | ICD-10-CM | POA: Diagnosis not present

## 2024-06-09 DIAGNOSIS — I63412 Cerebral infarction due to embolism of left middle cerebral artery: Secondary | ICD-10-CM | POA: Diagnosis not present

## 2024-06-09 DIAGNOSIS — Q2112 Patent foramen ovale: Secondary | ICD-10-CM | POA: Diagnosis not present

## 2024-06-09 DIAGNOSIS — Z87891 Personal history of nicotine dependence: Secondary | ICD-10-CM

## 2024-06-09 HISTORY — PX: TRANSESOPHAGEAL ECHOCARDIOGRAM (CATH LAB): EP1270

## 2024-06-09 LAB — CARDIOLIPIN ANTIBODIES, IGG, IGM, IGA
Anticardiolipin IgA: <9 U/mL (ref 0–11)
Anticardiolipin IgG: <9 GPL U/mL (ref 0–14)
Anticardiolipin IgM: <9 [MPL'U]/mL (ref 0–12)

## 2024-06-09 LAB — PROTEIN C ACTIVITY: Protein C Activity: 51 % — ABNORMAL LOW (ref 73–180)

## 2024-06-09 LAB — PROTEIN C, TOTAL: Protein C, Total: 50 % — ABNORMAL LOW (ref 60–150)

## 2024-06-09 LAB — LUPUS ANTICOAGULANT PANEL
DRVVT: 33.8 s (ref 0.0–47.0)
PTT Lupus Anticoagulant: 26.3 s (ref 0.0–43.5)

## 2024-06-09 LAB — PROTEIN S, TOTAL: Protein S Ag, Total: 39 % — ABNORMAL LOW (ref 60–150)

## 2024-06-09 LAB — PROTEIN S ACTIVITY: Protein S Activity: 49 % — ABNORMAL LOW (ref 63–140)

## 2024-06-09 LAB — ECHO TEE

## 2024-06-09 LAB — ANA W/REFLEX IF POSITIVE: Anti Nuclear Antibody (ANA): NEGATIVE

## 2024-06-09 SURGERY — TRANSESOPHAGEAL ECHOCARDIOGRAM (TEE) (CATHLAB)
Anesthesia: Monitor Anesthesia Care

## 2024-06-09 MED ORDER — SODIUM CHLORIDE 0.9 % IV SOLN: INTRAVENOUS | Status: DC | PRN

## 2024-06-09 MED ORDER — CLOPIDOGREL BISULFATE 75 MG PO TABS
75.0000 mg | ORAL_TABLET | Freq: Every day | ORAL | 1 refills | Status: DC
  Filled 2024-06-09: qty 30, 30d supply, fill #0

## 2024-06-09 MED ORDER — ASPIRIN 81 MG PO TBEC
81.0000 mg | DELAYED_RELEASE_TABLET | Freq: Every day | ORAL | 1 refills | Status: AC
Start: 2024-06-10 — End: 2024-08-09
  Filled 2024-06-09: qty 30, 30d supply, fill #0

## 2024-06-09 MED ORDER — LIDOCAINE HCL (PF) 2 % IJ SOLN
INTRAMUSCULAR | Status: DC | PRN
  Administered 2024-06-09: 40 mg via INTRADERMAL

## 2024-06-09 MED ORDER — PROPOFOL 10 MG/ML IV BOLUS
INTRAVENOUS | Status: DC | PRN
  Administered 2024-06-09: 20 mg via INTRAVENOUS
  Administered 2024-06-09: 60 mg via INTRAVENOUS
  Administered 2024-06-09 (×2): 20 mg via INTRAVENOUS

## 2024-06-09 MED ORDER — PROPOFOL 500 MG/50ML IV EMUL
INTRAVENOUS | Status: DC | PRN
  Administered 2024-06-09: 150 ug/kg/min via INTRAVENOUS
  Administered 2024-06-09: 200 ug/kg/min via INTRAVENOUS

## 2024-06-09 NOTE — Anesthesia Preprocedure Evaluation (Addendum)
 Anesthesia Evaluation  Patient identified by MRN, date of birth, ID band Patient awake    Reviewed: Allergy & Precautions, H&P , NPO status , Patient's Chart, lab work & pertinent test results  Airway Mallampati: II  TM Distance: >3 FB Neck ROM: Full    Dental no notable dental hx. (+) Teeth Intact, Dental Advisory Given   Pulmonary former smoker   Pulmonary exam normal breath sounds clear to auscultation       Cardiovascular hypertension,  Rhythm:Regular Rate:Normal     Neuro/Psych  Headaches CVA  negative psych ROS   GI/Hepatic negative GI ROS, Neg liver ROS,,,  Endo/Other  diabetes, Gestational  Class 3 obesity  Renal/GU negative Renal ROS  negative genitourinary   Musculoskeletal   Abdominal   Peds  Hematology negative hematology ROS (+)   Anesthesia Other Findings   Reproductive/Obstetrics negative OB ROS                              Anesthesia Physical Anesthesia Plan  ASA: 3  Anesthesia Plan: MAC   Post-op Pain Management: Minimal or no pain anticipated   Induction: Intravenous  PONV Risk Score and Plan: 2 and Propofol  infusion and Treatment may vary due to age or medical condition  Airway Management Planned: Natural Airway and Simple Face Mask  Additional Equipment:   Intra-op Plan:   Post-operative Plan:   Informed Consent: I have reviewed the patients History and Physical, chart, labs and discussed the procedure including the risks, benefits and alternatives for the proposed anesthesia with the patient or authorized representative who has indicated his/her understanding and acceptance.     Dental advisory given  Plan Discussed with: CRNA  Anesthesia Plan Comments:          Anesthesia Quick Evaluation

## 2024-06-09 NOTE — Progress Notes (Signed)
 Discharged to home, AVS reviewed by Tax inspector. IV removed, tele box returned. Family to provide transportation.

## 2024-06-09 NOTE — Anesthesia Procedure Notes (Signed)
 Procedure Name: MAC Date/Time: 06/09/2024 9:04 AM  Performed by: Arvell Edsel HERO, CRNAPre-anesthesia Checklist: Patient identified, Emergency Drugs available, Suction available and Patient being monitored Patient Re-evaluated:Patient Re-evaluated prior to induction Oxygen Delivery Method: Simple face mask

## 2024-06-09 NOTE — TOC Transition Note (Signed)
 Transition of Care [facility]) - Discharge Note   Patient Details  Name: Sarah Holland MRN: [id number] Date of Birth: 1991-03-29  Transition of Care [facility]) CM/SW Contact:  [name], RN Phone Number: 06/09/2024, 12:02 PM   Clinical Narrative:     Pt is discharging home with outpatient therapy through [facility]. Information on her AVS. Pt has transportation home.  Final next level of care: OP Rehab Barriers to Discharge: No Barriers Identified   Patient Goals and CMS Choice     Choice offered to / list presented to : Patient      Discharge Placement                       Discharge Plan and Services Additional resources added to the After Visit Summary for     Discharge Planning Services: CM Consult                                 Social Drivers of Health (SDOH) Interventions SDOH Screenings   Food Insecurity: No Food Insecurity (06/07/2024)  Housing: Low Risk  (06/07/2024)  Transportation Needs: No Transportation Needs (06/07/2024)  Utilities: Not At Risk (06/07/2024)  Depression (PHQ2-9): Low Risk  (05/01/2022)  Financial Resource Strain: Low Risk  (03/24/2024)   Received from [facility]  Tobacco Use: Medium Risk (06/07/2024)     Readmission Risk Interventions     No data to display

## 2024-06-09 NOTE — Plan of Care (Signed)
   Problem: Education: Goal: Knowledge of disease or condition will improve Outcome: Progressing   Problem: Ischemic Stroke/TIA Tissue Perfusion: Goal: Complications of ischemic stroke/TIA will be minimized Outcome: Progressing

## 2024-06-09 NOTE — Discharge Summary (Signed)
 Physician Discharge Summary  Sarah Holland FMW:[id number] DOB: [birth year] DOA: 06/07/2024  PCP: Sherial Bail, MD  Admit date: 06/07/2024 Discharge date: 06/09/2024 Recommendations for Outpatient Follow-up:  Follow up with PCP in 1 weeks-call for appointment Please obtain BMP/CBC in one week Follow-up with neurology, cardiology and hematology as outpatient  Discharge Dispo: home Discharge Condition: Stable Code Status:   Code Status: Full Code Diet recommendation:  Diet Order             Diet regular Room service appropriate? Yes; Fluid consistency: Thin  Diet effective now                    Brief/Interim Summary: 33 y.o. year old female with past medical history of gestational diabetes, gestational hypertension, migraines, iron deficiency anemia, and vitamin d deficiency presented to ED with sudden onset of right-sided weakness at work and slurred speech with headache that developed after.  Seen as a code stroke in the ED, no TNK given due to mild symptoms.  Neurology was consulted and admitted for further workup. Found to have acute stroke with a small left MCA infarct, complete stroke workup TEE showed large PFO and referred to structural cardiology for closure.  Hypercoagulable work up showed protein C, S deficiency and oncology was consulted.  Subjective: Seen and examined this morning resting comfortably was waiting for TEE no new complaints  Discharge diagnosis:  Acute stroke likely small left MCA infarct not detected on MRI, etiology embolic pattern but unclear questionable PFO related: Appreciate neurology input, continue stroke workup as below CT head/CTA head and neck: No acute intracranial finding but would left M3 occlusion MRI no acute finding 2D echo EF 60-55% evidence of large PFO, TCD bubble study Spencer degree 3 with Valsalva.  TEE showed large PFO and referred to structural cardiology for closure.  Hypercoagulable work up showed protein C, S deficiency  and oncology was consulted-Case discussed with Dr. Lonn who has advised outpatient follow-up and is scheduled for Monday. A1c 5.5 stable, LDL 77 goal less than 70-per neurology avoid statin given childbearing age No antithrombotic prior to admission now on aspirin  65 and Plavix  75, to continue until seen by cardiology   Mgraine: Continue sumatriptan  as needed  Hypertension: High on presentation currently stable, not on meds.  Optimize for normotension for long-term  Hyperlipidemia: LDL 77 goal is 70, see above.   Discharge Exam: Vitals:   06/09/24 0945 06/09/24 1003  BP: 117/76 123/77  Pulse: 86 80  Resp: 15 16  Temp:  97.7 F (36.5 C)  SpO2: 99% 100%   General: Pt is alert, awake, not in acute distress Cardiovascular: RRR, S1/S2 +, no rubs, no gallops Respiratory: CTA bilaterally, no wheezing, no rhonchi Abdominal: Soft, NT, ND, bowel sounds + Extremities: no edema, no cyanosis  Discharge Instructions  Discharge Instructions     Ambulatory referral to Occupational Therapy   Complete by: As directed    Ambulatory referral to Physical Therapy   Complete by: As directed    Discharge instructions   Complete by: As directed    You will need to follow-up with hematology coming week with Dr. Lonn as scheduled.  You will also need to follow-up with structural cardiology for a closure of a PFO In the heart.  Please call call MD or return to ER for similar or worsening recurring problem that brought you to hospital or if any fever,nausea/vomiting,abdominal pain, uncontrolled pain, chest pain,  shortness of breath or any other alarming symptoms.  Please follow-up your doctor as instructed in a week time and call the office for appointment.  Please avoid alcohol, smoking, or any other illicit substance and maintain healthy habits including taking your regular medications as prescribed.  You were cared for by a hospitalist during your hospital stay. If you have any questions  about your discharge medications or the care you received while you were in the hospital after you are discharged, you can call the unit and ask to speak with the hospitalist on call if the hospitalist that took care of you is not available.  Once you are discharged, your primary care physician will handle any further medical issues. Please note that NO REFILLS for any discharge medications will be authorized once you are discharged, as it is imperative that you return to your primary care physician (or establish a relationship with a primary care physician if you do not have one) for your aftercare needs so that they can reassess your need for medications and monitor your lab values   Increase activity slowly   Complete by: As directed       Allergies as of 06/09/2024   No Known Allergies      Medication List     TAKE these medications    aspirin  EC 81 MG tablet Take 1 tablet (81 mg total) by mouth daily. Swallow whole. Start taking on: June 10, 2024   clopidogrel  75 MG tablet Commonly known as: PLAVIX  Take 1 tablet (75 mg total) by mouth daily. Start taking on: June 10, 2024   medroxyPROGESTERone  150 MG/ML injection Commonly known as: DEPO-PROVERA  Inject 150 mg into the muscle every 3 (three) months.   SUMAtriptan  100 MG tablet Commonly known as: IMITREX  Take 100 mg by mouth every 2 (two) hours as needed for migraine or headache.        Follow-up Information     [facility] Follow up.   Specialty: Rehabilitation Why: [facility] will contact you for the first appointment Contact information: [address] [place]  [address] [phone]        Sherial Bail, MD Follow up in 1 week(s).   Specialty: Internal Medicine Contact information: [address] [place] [address] [phone]         Lonn Hicks, MD Follow up on 06/13/2024.   Specialty: Hematology and Oncology Why: call  office in 1-2 days Contact information: [address] [place] [address] [phone]         Thukkani, Arun K, MD Follow up.   Specialty: Cardiology Why: call Contact information: [address] [place] [address] [phone]                No Known Allergies  The results of significant diagnostics from this hospitalization (including imaging, microbiology, ancillary and laboratory) are listed below for reference.    Microbiology: No results found for this or any previous visit (from the past 240 hours).  Procedures/Studies: ECHO TEE Result Date: 06/09/2024    TRANSESOPHOGEAL ECHO REPORT   Patient Name:   Sarah Holland Date of Exam: 06/09/2024 Medical Rec #:  [id number]        Height:       65.0 in Accession #:    [id number]       Weight:       213.4 lb Date of Birth:  1991-11-29        BSA:          2.033  m Patient Age:    32 years         BP:           131/87 mmHg Patient Gender: F                HR:           87 bpm. Exam Location:  Inpatient Procedure: Transesophageal Echo, 3D Echo, Color Doppler and Saline Contrast            Bubble Study (Both Spectral and Color Flow Doppler were utilized            during procedure). Indications:     PFO  History:         Patient has prior history of Echocardiogram examinations, most                  recent 06/07/2024. Risk Factors:Former Smoker.  Sonographer:     Koleen Popper RDCS Referring Phys:  [id number] ZANE ADAMS Diagnosing Phys: Maude Emmer MD PROCEDURE: After discussion of the risks and benefits of a TEE, an informed consent was obtained from the patient. The transesophogeal probe was passed without difficulty through the esophogus of the patient. Imaged were obtained with the patient in a left lateral decubitus position. Sedation performed by different physician. The patient was monitored while under deep sedation. Anesthestetic sedation was provided intravenously by Anesthesiology: 350mg  of Propofol ,  40mg  of Lidocaine . Image quality was good. The patient's vital signs; including heart rate, blood pressure, and oxygen saturation; remained stable throughout the procedure. The patient developed no complications during the procedure.  IMPRESSIONS  1. Moderate sized tunneled PFO Markedly positive bubble study particularly with valsalva/cough.  2. Refer to structural team for PFO closure.  3. Left ventricular ejection fraction, by estimation, is 60 to 65%. The left ventricle has normal function. The left ventricle has no regional wall motion abnormalities.  4. Right ventricular systolic function is normal. The right ventricular size is normal.  5. No left atrial/left atrial appendage thrombus was detected.  6. The mitral valve is normal in structure. No evidence of mitral valve regurgitation. No evidence of mitral stenosis.  7. The aortic valve is normal in structure. Aortic valve regurgitation is not visualized. No aortic stenosis is present.  8. The inferior vena cava is normal in size with greater than 50% respiratory variability, suggesting right atrial pressure of 3 mmHg.  9. Evidence of atrial level shunting detected by color flow Doppler. Agitated saline contrast bubble study was positive with shunting observed within 3-6 cardiac cycles suggestive of interatrial shunt. There is a moderately sized patent foramen ovale with predominantly left to right shunting across the atrial septum. 10. 3D performed of the atrial septum and demonstrates 3D of PFO performed. Conclusion(s)/Recommendation(s): Normal biventricular function without evidence of hemodynamically significant valvular heart disease. FINDINGS  Left Ventricle: Left ventricular ejection fraction, by estimation, is 60 to 65%. The left ventricle has normal function. The left ventricle has no regional wall motion abnormalities. The left ventricular internal cavity size was normal in size. There is  no left ventricular hypertrophy. Right Ventricle: The right  ventricular size is normal. No increase in right ventricular wall thickness. Right ventricular systolic function is normal. Left Atrium: Left atrial size was normal in size. No left atrial/left atrial appendage thrombus was detected. Right Atrium: Right atrial size was normal in size. Pericardium: There is no evidence of pericardial effusion. Mitral Valve: The mitral valve is normal in structure. No evidence of mitral  valve regurgitation. No evidence of mitral valve stenosis. Tricuspid Valve: The tricuspid valve is normal in structure. Tricuspid valve regurgitation is not demonstrated. No evidence of tricuspid stenosis. Aortic Valve: The aortic valve is normal in structure. Aortic valve regurgitation is not visualized. No aortic stenosis is present. Pulmonic Valve: The pulmonic valve was normal in structure. Pulmonic valve regurgitation is not visualized. No evidence of pulmonic stenosis. Aorta: The aortic root is normal in size and structure. Venous: The inferior vena cava is normal in size with greater than 50% respiratory variability, suggesting right atrial pressure of 3 mmHg. IAS/Shunts: Evidence of atrial level shunting detected by color flow Doppler. Agitated saline contrast was given intravenously to evaluate for intracardiac shunting. Agitated saline contrast bubble study was positive with shunting observed within 3-6 cardiac cycles suggestive of interatrial shunt. A moderately sized patent foramen ovale is detected with predominantly left to right shunting across the atrial septum. Additional Comments: Moderate sized tunneled PFO Markedly positive bubble study particularly with valsalva/cough. Refer to structural team for PFO closure. 3D was performed not requiring image post processing on an independent workstation and was abnormal. LEFT VENTRICLE PLAX 2D LVOT diam:     2.10 cm LVOT Area:     3.46 cm   AORTA Ao Root diam: 2.60 cm  SHUNTS Systemic Diam: 2.10 cm Maude Emmer MD Electronically signed by  Maude Emmer MD Signature Date/Time: 06/09/2024/9:48:33 AM    Final    EP STUDY Result Date: 06/09/2024 See surgical note for result.  VAS US  LOWER EXTREMITY VENOUS (DVT) Result Date: 06/08/2024  Lower Venous DVT Study Patient Name:  Sarah Holland  Date of Exam:   06/08/2024 Medical Rec #: [id number]         Accession #:    [id number] Date of Birth: 15-Feb-1991         Patient Gender: F Patient Age:   65 years Exam Location:  [facility] Procedure:      VAS US  LOWER EXTREMITY VENOUS (DVT) Referring Phys: ARY XU --------------------------------------------------------------------------------  Indications: Stroke, and Small PFO.  Comparison Study: No priors. Performing Technologist: Ricka Sturdivant-Jones RDMS, RVT  Examination Guidelines: A complete evaluation includes B-mode imaging, spectral Doppler, color Doppler, and power Doppler as needed of all accessible portions of each vessel. Bilateral testing is considered an integral part of a complete examination. Limited examinations for reoccurring indications may be performed as noted. The reflux portion of the exam is performed with the patient in reverse Trendelenburg.  +---------+---------------+---------+-----------+----------+--------------+ RIGHT    CompressibilityPhasicitySpontaneityPropertiesThrombus Aging +---------+---------------+---------+-----------+----------+--------------+ CFV      Full           Yes      Yes                                 +---------+---------------+---------+-----------+----------+--------------+ SFJ      Full                                                        +---------+---------------+---------+-----------+----------+--------------+ FV Prox  Full                                                        +---------+---------------+---------+-----------+----------+--------------+  FV Mid   Full                                                         +---------+---------------+---------+-----------+----------+--------------+ FV DistalFull                                                        +---------+---------------+---------+-----------+----------+--------------+ PFV      Full                                                        +---------+---------------+---------+-----------+----------+--------------+ POP      Full           Yes      Yes                                 +---------+---------------+---------+-----------+----------+--------------+ PTV      Full                                                        +---------+---------------+---------+-----------+----------+--------------+ PERO     Full                                                        +---------+---------------+---------+-----------+----------+--------------+   +---------+---------------+---------+-----------+----------+--------------+ LEFT     CompressibilityPhasicitySpontaneityPropertiesThrombus Aging +---------+---------------+---------+-----------+----------+--------------+ CFV      Full           Yes      Yes                                 +---------+---------------+---------+-----------+----------+--------------+ SFJ      Full                                                        +---------+---------------+---------+-----------+----------+--------------+ FV Prox  Full                                                        +---------+---------------+---------+-----------+----------+--------------+ FV Mid   Full                                                        +---------+---------------+---------+-----------+----------+--------------+  FV DistalFull                                                        +---------+---------------+---------+-----------+----------+--------------+ PFV      Full                                                         +---------+---------------+---------+-----------+----------+--------------+ POP      Full           Yes      Yes                                 +---------+---------------+---------+-----------+----------+--------------+ PTV      Full                                                        +---------+---------------+---------+-----------+----------+--------------+     Summary: BILATERAL: - No evidence of deep vein thrombosis seen in the lower extremities, bilaterally. -No evidence of popliteal cyst, bilaterally.   *See table(s) above for measurements and observations. Electronically signed by Debby Robertson on 06/08/2024 at 5:03:53 PM.    Final    VAS US  TRANSCRANIAL DOPPLER W BUBBLES Result Date: 06/07/2024  Transcranial Doppler with Bubble Patient Name:  Sarah Holland  Date of Exam:   06/07/2024 Medical Rec #: [id number]         Accession #:    [id number] Date of Birth: 1991/04/11         Patient Gender: F Patient Age:   58 years Exam Location:  [facility] Procedure:      VAS US  TRANSCRANIAL DOPPLER W BUBBLES Referring Phys: ARY XU --------------------------------------------------------------------------------  Indications: Stroke. Comparison Study: No previous exams Performing Technologist: Jody Hill RVT, RDMS  Examination Guidelines: A complete evaluation includes B-mode imaging, spectral Doppler, color Doppler, and power Doppler as needed of all accessible portions of each vessel. Bilateral testing is considered an integral part of a complete examination. Limited examinations for reoccurring indications may be performed as noted.  Summary:  A vascular evaluation was performed. The left middle cerebral artery was studied. An IV was inserted into the patient's left forearm. Verbal informed consent was obtained.  Less than 10 HITS detected at rest, indicating a Spencer Grade 1 PFO. With valsalva, between 30-100 HITs detected, indicating a Spencer Grade 3 PFO. clinical correlation  recommended *See table(s) above for TCD measurements and observations.  Diagnosing physician: ARY Cummins MD Electronically signed by ARY Cummins MD on 06/07/2024 at 5:32:47 PM.    Final    ECHOCARDIOGRAM COMPLETE Result Date: 06/07/2024    ECHOCARDIOGRAM REPORT   Patient Name:   Sarah Holland Date of Exam: 06/07/2024 Medical Rec #:  [id number]        Height:       65.0 in Accession #:    [id number]       Weight:       213.4 lb Date of  Birth:  [birth year]        BSA:          2.033 m Patient Age:    32 years         BP:           119/60 mmHg Patient Gender: F                HR:           71 bpm. Exam Location:  Inpatient Procedure: 2D Echo, Cardiac Doppler and Color Doppler (Both Spectral and Color            Flow Doppler were utilized during procedure). Indications:    Stroke  History:        Patient has no prior history of Echocardiogram examinations.  Sonographer:    Philomena Daring Referring Phys: [id number] KATY L FOUST IMPRESSIONS  1. Left ventricular ejection fraction, by estimation, is 60 to 65%. The left ventricle has normal function. The left ventricle has no regional wall motion abnormalities. Left ventricular diastolic parameters were normal.  2. Right ventricular systolic function is normal. The right ventricular size is normal.  3. The mitral valve is normal in structure. No evidence of mitral valve regurgitation. No evidence of mitral stenosis.  4. The aortic valve is tricuspid. Aortic valve regurgitation is not visualized. No aortic stenosis is present.  5. The inferior vena cava is normal in size with greater than 50% respiratory variability, suggesting right atrial pressure of 3 mmHg.  6. Evidence of atrial level shunting detected by color flow Doppler. Agitated saline contrast bubble study was positive with shunting observed within 3-6 cardiac cycles suggestive of interatrial shunt. There is a large patent foramen ovale. Comparison(s): No prior Echocardiogram. FINDINGS  Left Ventricle: Left ventricular  ejection fraction, by estimation, is 60 to 65%. The left ventricle has normal function. The left ventricle has no regional wall motion abnormalities. The left ventricular internal cavity size was normal in size. There is  no left ventricular hypertrophy. Left ventricular diastolic parameters were normal. Right Ventricle: The right ventricular size is normal. No increase in right ventricular wall thickness. Right ventricular systolic function is normal. Left Atrium: Left atrial size was normal in size. Right Atrium: Right atrial size was normal in size. Pericardium: There is no evidence of pericardial effusion. Mitral Valve: The mitral valve is normal in structure. No evidence of mitral valve regurgitation. No evidence of mitral valve stenosis. Tricuspid Valve: The tricuspid valve is normal in structure. Tricuspid valve regurgitation is not demonstrated. No evidence of tricuspid stenosis. Aortic Valve: The aortic valve is tricuspid. Aortic valve regurgitation is not visualized. No aortic stenosis is present. Pulmonic Valve: The pulmonic valve was normal in structure. Pulmonic valve regurgitation is mild. No evidence of pulmonic stenosis. Aorta: The aortic root and ascending aorta are structurally normal, with no evidence of dilitation, the aortic root is normal in size and structure and the aortic root, ascending aorta and aortic arch are all structurally normal, with no evidence of dilitation or obstruction. Venous: The inferior vena cava is normal in size with greater than 50% respiratory variability, suggesting right atrial pressure of 3 mmHg. IAS/Shunts: Evidence of atrial level shunting detected by color flow Doppler. Agitated saline contrast was given intravenously to evaluate for intracardiac shunting. Agitated saline contrast bubble study was positive with shunting observed within 3-6 cardiac cycles suggestive of interatrial shunt. A large patent foramen ovale is detected.  LEFT VENTRICLE PLAX 2D LVIDd:  4.20 cm   Diastology LVIDs:         3.00 cm   LV e' medial:    13.70 cm/s LV PW:         0.90 cm   LV E/e' medial:  6.7 LV IVS:        0.90 cm   LV e' lateral:   16.80 cm/s LVOT diam:     2.00 cm   LV E/e' lateral: 5.5 LV SV:         57 LV SV Index:   28 LVOT Area:     3.14 cm  RIGHT VENTRICLE             IVC RV S prime:     12.50 cm/s  IVC diam: 1.40 cm TAPSE (M-mode): 1.8 cm LEFT ATRIUM             Index        RIGHT ATRIUM           Index LA diam:        3.00 cm 1.48 cm/m   RA Area:     11.10 cm LA Vol (A2C):   42.1 ml 20.70 ml/m  RA Volume:   22.40 ml  11.02 ml/m LA Vol (A4C):   41.3 ml 20.31 ml/m LA Biplane Vol: 41.7 ml 20.51 ml/m  AORTIC VALVE LVOT Vmax:   93.80 cm/s LVOT Vmean:  64.500 cm/s LVOT VTI:    0.183 m  AORTA Ao Root diam: 2.50 cm Ao Asc diam:  2.70 cm MITRAL VALVE MV Area (PHT): 3.72 cm    SHUNTS MV Decel Time: 204 msec    Systemic VTI:  0.18 m MV E velocity: 91.70 cm/s  Systemic Diam: 2.00 cm MV A velocity: 63.90 cm/s MV E/A ratio:  1.44 Stanly Leavens MD Electronically signed by Stanly Leavens MD Signature Date/Time: 06/07/2024/4:53:27 PM    Final    MR BRAIN WO CONTRAST Result Date: 06/07/2024 CLINICAL DATA:  Stroke, follow up EXAM: MRI HEAD WITHOUT CONTRAST TECHNIQUE: Multiplanar, multiecho pulse sequences of the brain and surrounding structures were obtained without intravenous contrast. COMPARISON:  CT head from earlier today. FINDINGS: Brain: No acute infarction, hemorrhage, hydrocephalus, extra-axial collection or mass lesion. Vascular: Normal flow voids. Skull and upper cervical spine: Normal marrow signal. Sinuses/Orbits: Negative. IMPRESSION: No evidence of acute intracranial abnormality. Electronically Signed   By: Gilmore GORMAN Molt M.D.   On: 06/07/2024 03:05   CT ANGIO HEAD NECK W WO CM (CODE STROKE) Result Date: 06/07/2024 CLINICAL DATA:  Neuro deficit, acute, stroke suspected EXAM: CT ANGIOGRAPHY HEAD AND NECK WITH AND WITHOUT CONTRAST TECHNIQUE: Multidetector  CT imaging of the head and neck was performed using the standard protocol during bolus administration of intravenous contrast. Multiplanar CT image reconstructions and MIPs were obtained to evaluate the vascular anatomy. Carotid stenosis measurements (when applicable) are obtained utilizing NASCET criteria, using the distal internal carotid diameter as the denominator. RADIATION DOSE REDUCTION: This exam was performed according to the departmental dose-optimization program which includes automated exposure control, adjustment of the mA and/or kV according to patient size and/or use of iterative reconstruction technique. CONTRAST:  75mL OMNIPAQUE  IOHEXOL  350 MG/ML SOLN COMPARISON:  Same day CTA head/neck. FINDINGS: CTA NECK FINDINGS Aortic arch: Great vessel origins are patent. No significant stenosis. Right carotid system: No evidence of dissection, stenosis (50% or greater), or occlusion. Left carotid system: No evidence of dissection, stenosis (50% or greater), or occlusion. Vertebral arteries: Left dominant. No evidence of dissection, stenosis (  50% or greater), or occlusion. Skeleton: No acute abnormality on limited assessment. Other neck: No acute abnormality on limited assessment. Upper chest: Visualized lung apices are Review of the MIP images confirms the above findings CTA HEAD FINDINGS Anterior circulation: Bilateral ICAs are patent without significant stenosis. Right MCA and bilateral ACAs are patent significant. M1 MCAs patent. Occlusion of a proximal M3 MCA branch (for example see series 5, image 68 and series 8, image 108). Posterior circulation: Bilateral intradural vertebral arteries, basilar artery and bilateral posterior cerebral arteries are patent without proximal hemodynamically significant stenosis. Venous sinuses: As permitted by contrast timing, patent. 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 Findings discussed with Dr.  Lindzen via 1:39 a.m. Electronically Signed   By: Gilmore GORMAN Molt M.D.   On: 06/07/2024 01:50   CT HEAD CODE STROKE WO CONTRAST Result Date: 06/07/2024 CLINICAL DATA:  Code stroke.  Neuro deficit, acute, stroke suspected EXAM: CT HEAD WITHOUT CONTRAST TECHNIQUE: Contiguous axial images were obtained from the base of the skull through the vertex without intravenous contrast. RADIATION DOSE REDUCTION: This exam was performed according to the departmental dose-optimization program which includes automated exposure control, adjustment of the mA and/or kV according to patient size and/or use of iterative reconstruction technique. COMPARISON:  CTA head/neck March 23, 2023. FINDINGS: Brain: No evidence of acute infarction, hemorrhage, hydrocephalus, extra-axial collection or mass lesion/mass effect. Vascular: No hyperdense vessel identified. Skull: No acute fracture. Sinuses/Orbits: Clear sinuses.  No acute orbital findings. Other: No mastoid effusions. ASPECTS [facility] Stroke Program Early CT Score) Total score (0-10 with 10 being normal): 10. IMPRESSION: No evidence of acute intracranial abnormality. ASPECTS is 10. Code stroke imaging results were communicated on 06/07/2024 at 1:20 am to provider Dr. Merrianne via secure text paging. Electronically Signed   By: Gilmore GORMAN Molt M.D.   On: 06/07/2024 01:20    Labs: BNP (last 3 results) No results for input(s): BNP in the last 8760 hours. Basic Metabolic Panel: Recent Labs  Lab 06/07/24 0109 06/08/24 0330  NA 140  143 137  K 3.7  3.5 3.9  CL 108  109 108  CO2 20* 24  GLUCOSE 92  92 106*  BUN 16  13 13   CREATININE 1.01*  0.90 1.06*  CALCIUM 9.7 8.9   Liver Function Tests: Recent Labs  Lab 06/07/24 0109  AST 22  ALT 26  ALKPHOS 39  BILITOT 0.5  PROT 7.4  ALBUMIN 4.3   No results for input(s): LIPASE, AMYLASE in the last 168 hours. No results for input(s): AMMONIA  in the last 168 hours. CBC: Recent Labs  Lab 06/07/24 0109  06/08/24 0330  WBC 6.3 5.0  NEUTROABS 1.9  --   HGB 13.3  15.0 11.6*  HCT 42.7  44.0 36.3  MCV 82.0 79.8*  PLT 274 233   CBG: Recent Labs  Lab 06/07/24 0103  GLUCAP 106*   Hgb A1c Recent Labs    06/07/24 0819  HGBA1C 5.5   Recent Labs    06/08/24 0330  CHOL 131  HDL 42  LDLCALC 77  TRIG 62  CHOLHDL 3.1   Thyroid function studies No results for input(s): TSH, T4TOTAL, T3FREE, THYROIDAB in the last 72 hours.  Invalid input(s): FREET3 Urinalysis    Component Value Date/Time   COLORURINE YELLOW (A) 05/13/2023 2148   APPEARANCEUR Clear 07/01/2023 0811   LABSPEC 1.027 05/13/2023 2148   LABSPEC 1.024 06/27/2014 1153   PHURINE 5.0 05/13/2023  2148   GLUCOSEU Negative 07/01/2023 0811   GLUCOSEU see comment 06/27/2014 1153   HGBUR NEGATIVE 05/13/2023 2148   BILIRUBINUR Negative 07/01/2023 0811   BILIRUBINUR see comment 06/27/2014 1153   KETONESUR NEGATIVE 05/13/2023 2148   PROTEINUR Negative 07/01/2023 0811   PROTEINUR 30 (A) 05/13/2023 2148   UROBILINOGEN 0.2 05/01/2022 1013   NITRITE Negative 07/01/2023 0811   NITRITE POSITIVE (A) 05/13/2023 2148   LEUKOCYTESUR Trace (A) 07/01/2023 0811   LEUKOCYTESUR NEGATIVE 05/13/2023 2148   LEUKOCYTESUR see comment 06/27/2014 1153   Sepsis Labs Recent Labs  Lab 06/07/24 0109 06/08/24 0330  WBC 6.3 5.0   Microbiology No results found for this or any previous visit (from the past 240 hours).   Time coordinating discharge: 25 minutes  SIGNED: Mennie LAMY, MD  [facility] 06/09/2024, 11:28 AM  If 7PM-7AM, please contact night-coverage [email or web address]

## 2024-06-09 NOTE — Progress Notes (Signed)
 STROKE TEAM PROGRESS NOTE   SUBJECTIVE (INTERVAL HISTORY) Her sister is at the bedside.  She had TEE today showed large PFO. Protein C and S also low, will see Dr. Lonn next Monday as outpt.    OBJECTIVE Temp:  [97.5 F (36.4 C)-98.7 F (37.1 C)] 98.7 F (37.1 C) (07/10 1132) Pulse Rate:  [72-106] 98 (07/10 1132) Cardiac Rhythm: Normal sinus rhythm (07/10 0930) Resp:  [14-23] 18 (07/10 1132) BP: (101-139)/(65-92) 139/92 (07/10 1132) SpO2:  [98 %-100 %] 100 % (07/10 1132)  Recent Labs  Lab 06/07/24 0103  GLUCAP 106*   Recent Labs  Lab 06/07/24 0109 06/08/24 0330  NA 140  143 137  K 3.7  3.5 3.9  CL 108  109 108  CO2 20* 24  GLUCOSE 92  92 106*  BUN 16  13 13   CREATININE 1.01*  0.90 1.06*  CALCIUM 9.7 8.9   Recent Labs  Lab 06/07/24 0109  AST 22  ALT 26  ALKPHOS 39  BILITOT 0.5  PROT 7.4  ALBUMIN 4.3   Recent Labs  Lab 06/07/24 0109 06/08/24 0330  WBC 6.3 5.0  NEUTROABS 1.9  --   HGB 13.3  15.0 11.6*  HCT 42.7  44.0 36.3  MCV 82.0 79.8*  PLT 274 233   No results for input(s): CKTOTAL, CKMB, CKMBINDEX, TROPONINI in the last 168 hours. Recent Labs    06/07/24 0109  LABPROT 13.5  INR 1.0   No results for input(s): COLORURINE, LABSPEC, PHURINE, GLUCOSEU, HGBUR, BILIRUBINUR, KETONESUR, PROTEINUR, UROBILINOGEN, NITRITE, LEUKOCYTESUR in the last 72 hours.  Invalid input(s): APPERANCEUR     Component Value Date/Time   CHOL 131 06/08/2024 0330   TRIG 62 06/08/2024 0330   HDL 42 06/08/2024 0330   CHOLHDL 3.1 06/08/2024 0330   VLDL 12 06/08/2024 0330   LDLCALC 77 06/08/2024 0330   Lab Results  Component Value Date   HGBA1C 5.5 06/07/2024      Component Value Date/Time   LABOPIA NONE DETECTED 06/07/2024 0442   COCAINSCRNUR NONE DETECTED 06/07/2024 0442   LABBENZ NONE DETECTED 06/07/2024 0442   AMPHETMU NONE DETECTED 06/07/2024 0442   THCU NONE DETECTED 06/07/2024 0442   LABBARB NONE DETECTED 06/07/2024  0442    Recent Labs  Lab 06/07/24 0109  ETH <15    I have personally reviewed the radiological images below and agree with the radiology interpretations.  ECHO TEE Result Date: 06/09/2024    TRANSESOPHOGEAL ECHO REPORT   Patient Name:   Sarah Holland Date of Exam: 06/09/2024 Medical Rec #:  [id number]        Height:       65.0 in Accession #:    [id number]       Weight:       213.4 lb Date of Birth:  25-Mar-1991        BSA:          2.033 m Patient Age:    32 years         BP:           131/87 mmHg Patient Gender: F                HR:           87 bpm. Exam Location:  Inpatient Procedure: Transesophageal Echo, 3D Echo, Color Doppler and Saline Contrast            Bubble Study (Both Spectral and Color Flow Doppler were utilized  during procedure). Indications:     PFO  History:         Patient has prior history of Echocardiogram examinations, most                  recent 06/07/2024. Risk Factors:Former Smoker.  Sonographer:     Koleen Popper RDCS Referring Phys:  [id number] ZANE ADAMS Diagnosing Phys: Maude Emmer MD PROCEDURE: After discussion of the risks and benefits of a TEE, an informed consent was obtained from the patient. The transesophogeal probe was passed without difficulty through the esophogus of the patient. Imaged were obtained with the patient in a left lateral decubitus position. Sedation performed by different physician. The patient was monitored while under deep sedation. Anesthestetic sedation was provided intravenously by Anesthesiology: 350mg  of Propofol , 40mg  of Lidocaine . Image quality was good. The patient's vital signs; including heart rate, blood pressure, and oxygen saturation; remained stable throughout the procedure. The patient developed no complications during the procedure.  IMPRESSIONS  1. Moderate sized tunneled PFO Markedly positive bubble study particularly with valsalva/cough.  2. Refer to structural team for PFO closure.  3. Left ventricular ejection fraction,  by estimation, is 60 to 65%. The left ventricle has normal function. The left ventricle has no regional wall motion abnormalities.  4. Right ventricular systolic function is normal. The right ventricular size is normal.  5. No left atrial/left atrial appendage thrombus was detected.  6. The mitral valve is normal in structure. No evidence of mitral valve regurgitation. No evidence of mitral stenosis.  7. The aortic valve is normal in structure. Aortic valve regurgitation is not visualized. No aortic stenosis is present.  8. The inferior vena cava is normal in size with greater than 50% respiratory variability, suggesting right atrial pressure of 3 mmHg.  9. Evidence of atrial level shunting detected by color flow Doppler. Agitated saline contrast bubble study was positive with shunting observed within 3-6 cardiac cycles suggestive of interatrial shunt. There is a moderately sized patent foramen ovale with predominantly left to right shunting across the atrial septum. 10. 3D performed of the atrial septum and demonstrates 3D of PFO performed. Conclusion(s)/Recommendation(s): Normal biventricular function without evidence of hemodynamically significant valvular heart disease. FINDINGS  Left Ventricle: Left ventricular ejection fraction, by estimation, is 60 to 65%. The left ventricle has normal function. The left ventricle has no regional wall motion abnormalities. The left ventricular internal cavity size was normal in size. There is  no left ventricular hypertrophy. Right Ventricle: The right ventricular size is normal. No increase in right ventricular wall thickness. Right ventricular systolic function is normal. Left Atrium: Left atrial size was normal in size. No left atrial/left atrial appendage thrombus was detected. Right Atrium: Right atrial size was normal in size. Pericardium: There is no evidence of pericardial effusion. Mitral Valve: The mitral valve is normal in structure. No evidence of mitral valve  regurgitation. No evidence of mitral valve stenosis. Tricuspid Valve: The tricuspid valve is normal in structure. Tricuspid valve regurgitation is not demonstrated. No evidence of tricuspid stenosis. Aortic Valve: The aortic valve is normal in structure. Aortic valve regurgitation is not visualized. No aortic stenosis is present. Pulmonic Valve: The pulmonic valve was normal in structure. Pulmonic valve regurgitation is not visualized. No evidence of pulmonic stenosis. Aorta: The aortic root is normal in size and structure. Venous: The inferior vena cava is normal in size with greater than 50% respiratory variability, suggesting right atrial pressure of 3 mmHg. IAS/Shunts: Evidence of atrial level shunting  detected by color flow Doppler. Agitated saline contrast was given intravenously to evaluate for intracardiac shunting. Agitated saline contrast bubble study was positive with shunting observed within 3-6 cardiac cycles suggestive of interatrial shunt. A moderately sized patent foramen ovale is detected with predominantly left to right shunting across the atrial septum. Additional Comments: Moderate sized tunneled PFO Markedly positive bubble study particularly with valsalva/cough. Refer to structural team for PFO closure. 3D was performed not requiring image post processing on an independent workstation and was abnormal. LEFT VENTRICLE PLAX 2D LVOT diam:     2.10 cm LVOT Area:     3.46 cm   AORTA Ao Root diam: 2.60 cm  SHUNTS Systemic Diam: 2.10 cm Maude Emmer MD Electronically signed by Maude Emmer MD Signature Date/Time: 06/09/2024/9:48:33 AM    Final    EP STUDY Result Date: 06/09/2024 See surgical note for result.  VAS US  LOWER EXTREMITY VENOUS (DVT) Result Date: 06/08/2024  Lower Venous DVT Study Patient Name:  Sarah Holland  Date of Exam:   06/08/2024 Medical Rec #: [id number]         Accession #:    [id number] Date of Birth: 07/30/1991         Patient Gender: F Patient Age:   19 years Exam Location:   [facility] Procedure:      VAS US  LOWER EXTREMITY VENOUS (DVT) Referring Phys: ARY Sherrilyn Nairn --------------------------------------------------------------------------------  Indications: Stroke, and Small PFO.  Comparison Study: No priors. Performing Technologist: Ricka Sturdivant-Jones RDMS, RVT  Examination Guidelines: A complete evaluation includes B-mode imaging, spectral Doppler, color Doppler, and power Doppler as needed of all accessible portions of each vessel. Bilateral testing is considered an integral part of a complete examination. Limited examinations for reoccurring indications may be performed as noted. The reflux portion of the exam is performed with the patient in reverse Trendelenburg.  +---------+---------------+---------+-----------+----------+--------------+ RIGHT    CompressibilityPhasicitySpontaneityPropertiesThrombus Aging +---------+---------------+---------+-----------+----------+--------------+ CFV      Full           Yes      Yes                                 +---------+---------------+---------+-----------+----------+--------------+ SFJ      Full                                                        +---------+---------------+---------+-----------+----------+--------------+ FV Prox  Full                                                        +---------+---------------+---------+-----------+----------+--------------+ FV Mid   Full                                                        +---------+---------------+---------+-----------+----------+--------------+ FV DistalFull                                                        +---------+---------------+---------+-----------+----------+--------------+  PFV      Full                                                        +---------+---------------+---------+-----------+----------+--------------+ POP      Full           Yes      Yes                                  +---------+---------------+---------+-----------+----------+--------------+ PTV      Full                                                        +---------+---------------+---------+-----------+----------+--------------+ PERO     Full                                                        +---------+---------------+---------+-----------+----------+--------------+   +---------+---------------+---------+-----------+----------+--------------+ LEFT     CompressibilityPhasicitySpontaneityPropertiesThrombus Aging +---------+---------------+---------+-----------+----------+--------------+ CFV      Full           Yes      Yes                                 +---------+---------------+---------+-----------+----------+--------------+ SFJ      Full                                                        +---------+---------------+---------+-----------+----------+--------------+ FV Prox  Full                                                        +---------+---------------+---------+-----------+----------+--------------+ FV Mid   Full                                                        +---------+---------------+---------+-----------+----------+--------------+ FV DistalFull                                                        +---------+---------------+---------+-----------+----------+--------------+ PFV      Full                                                        +---------+---------------+---------+-----------+----------+--------------+  POP      Full           Yes      Yes                                 +---------+---------------+---------+-----------+----------+--------------+ PTV      Full                                                        +---------+---------------+---------+-----------+----------+--------------+     Summary: BILATERAL: - No evidence of deep vein thrombosis seen in the lower extremities, bilaterally. -No evidence of  popliteal cyst, bilaterally.   *See table(s) above for measurements and observations. Electronically signed by Debby Robertson on 06/08/2024 at 5:03:53 PM.    Final    VAS US  TRANSCRANIAL DOPPLER W BUBBLES Result Date: 06/07/2024  Transcranial Doppler with Bubble Patient Name:  Sarah Holland  Date of Exam:   06/07/2024 Medical Rec #: [id number]         Accession #:    [id number] Date of Birth: 1991/05/05         Patient Gender: F Patient Age:   88 years Exam Location:  [facility] Procedure:      VAS US  TRANSCRANIAL DOPPLER W BUBBLES Referring Phys: ARY Olla Delancey --------------------------------------------------------------------------------  Indications: Stroke. Comparison Study: No previous exams Performing Technologist: Jody Hill RVT, RDMS  Examination Guidelines: A complete evaluation includes B-mode imaging, spectral Doppler, color Doppler, and power Doppler as needed of all accessible portions of each vessel. Bilateral testing is considered an integral part of a complete examination. Limited examinations for reoccurring indications may be performed as noted.  Summary:  A vascular evaluation was performed. The left middle cerebral artery was studied. An IV was inserted into the patient's left forearm. Verbal informed consent was obtained.  Less than 10 HITS detected at rest, indicating a Spencer Grade 1 PFO. With valsalva, between 30-100 HITs detected, indicating a Spencer Grade 3 PFO. clinical correlation recommended *See table(s) above for TCD measurements and observations.  Diagnosing physician: ARY Cummins MD Electronically signed by ARY Cummins MD on 06/07/2024 at 5:32:47 PM.    Final    ECHOCARDIOGRAM COMPLETE Result Date: 06/07/2024    ECHOCARDIOGRAM REPORT   Patient Name:   Sarah Holland Date of Exam: 06/07/2024 Medical Rec #:  [id number]        Height:       65.0 in Accession #:    [id number]       Weight:       213.4 lb Date of Birth:  [birth year]        BSA:          2.033 m Patient Age:    32 years          BP:           119/60 mmHg Patient Gender: F                HR:           71 bpm. Exam Location:  Inpatient Procedure: 2D Echo, Cardiac Doppler and Color Doppler (Both Spectral and Color            Flow Doppler were utilized during procedure). Indications:    Stroke  History:  Patient has no prior history of Echocardiogram examinations.  Sonographer:    Philomena Daring Referring Phys: [id number] KATY L FOUST IMPRESSIONS  1. Left ventricular ejection fraction, by estimation, is 60 to 65%. The left ventricle has normal function. The left ventricle has no regional wall motion abnormalities. Left ventricular diastolic parameters were normal.  2. Right ventricular systolic function is normal. The right ventricular size is normal.  3. The mitral valve is normal in structure. No evidence of mitral valve regurgitation. No evidence of mitral stenosis.  4. The aortic valve is tricuspid. Aortic valve regurgitation is not visualized. No aortic stenosis is present.  5. The inferior vena cava is normal in size with greater than 50% respiratory variability, suggesting right atrial pressure of 3 mmHg.  6. Evidence of atrial level shunting detected by color flow Doppler. Agitated saline contrast bubble study was positive with shunting observed within 3-6 cardiac cycles suggestive of interatrial shunt. There is a large patent foramen ovale. Comparison(s): No prior Echocardiogram. FINDINGS  Left Ventricle: Left ventricular ejection fraction, by estimation, is 60 to 65%. The left ventricle has normal function. The left ventricle has no regional wall motion abnormalities. The left ventricular internal cavity size was normal in size. There is  no left ventricular hypertrophy. Left ventricular diastolic parameters were normal. Right Ventricle: The right ventricular size is normal. No increase in right ventricular wall thickness. Right ventricular systolic function is normal. Left Atrium: Left atrial size was normal in size. Right  Atrium: Right atrial size was normal in size. Pericardium: There is no evidence of pericardial effusion. Mitral Valve: The mitral valve is normal in structure. No evidence of mitral valve regurgitation. No evidence of mitral valve stenosis. Tricuspid Valve: The tricuspid valve is normal in structure. Tricuspid valve regurgitation is not demonstrated. No evidence of tricuspid stenosis. Aortic Valve: The aortic valve is tricuspid. Aortic valve regurgitation is not visualized. No aortic stenosis is present. Pulmonic Valve: The pulmonic valve was normal in structure. Pulmonic valve regurgitation is mild. No evidence of pulmonic stenosis. Aorta: The aortic root and ascending aorta are structurally normal, with no evidence of dilitation, the aortic root is normal in size and structure and the aortic root, ascending aorta and aortic arch are all structurally normal, with no evidence of dilitation or obstruction. Venous: The inferior vena cava is normal in size with greater than 50% respiratory variability, suggesting right atrial pressure of 3 mmHg. IAS/Shunts: Evidence of atrial level shunting detected by color flow Doppler. Agitated saline contrast was given intravenously to evaluate for intracardiac shunting. Agitated saline contrast bubble study was positive with shunting observed within 3-6 cardiac cycles suggestive of interatrial shunt. A large patent foramen ovale is detected.  LEFT VENTRICLE PLAX 2D LVIDd:         4.20 cm   Diastology LVIDs:         3.00 cm   LV e' medial:    13.70 cm/s LV PW:         0.90 cm   LV E/e' medial:  6.7 LV IVS:        0.90 cm   LV e' lateral:   16.80 cm/s LVOT diam:     2.00 cm   LV E/e' lateral: 5.5 LV SV:         57 LV SV Index:   28 LVOT Area:     3.14 cm  RIGHT VENTRICLE             IVC RV S prime:  12.50 cm/s  IVC diam: 1.40 cm TAPSE (M-mode): 1.8 cm LEFT ATRIUM             Index        RIGHT ATRIUM           Index LA diam:        3.00 cm 1.48 cm/m   RA Area:     11.10 cm LA  Vol (A2C):   42.1 ml 20.70 ml/m  RA Volume:   22.40 ml  11.02 ml/m LA Vol (A4C):   41.3 ml 20.31 ml/m LA Biplane Vol: 41.7 ml 20.51 ml/m  AORTIC VALVE LVOT Vmax:   93.80 cm/s LVOT Vmean:  64.500 cm/s LVOT VTI:    0.183 m  AORTA Ao Root diam: 2.50 cm Ao Asc diam:  2.70 cm MITRAL VALVE MV Area (PHT): 3.72 cm    SHUNTS MV Decel Time: 204 msec    Systemic VTI:  0.18 m MV E velocity: 91.70 cm/s  Systemic Diam: 2.00 cm MV A velocity: 63.90 cm/s MV E/A ratio:  1.44 Stanly Leavens MD Electronically signed by Stanly Leavens MD Signature Date/Time: 06/07/2024/4:53:27 PM    Final    MR BRAIN WO CONTRAST Result Date: 06/07/2024 CLINICAL DATA:  Stroke, follow up EXAM: MRI HEAD WITHOUT CONTRAST TECHNIQUE: Multiplanar, multiecho pulse sequences of the brain and surrounding structures were obtained without intravenous contrast. COMPARISON:  CT head from earlier today. FINDINGS: Brain: No acute infarction, hemorrhage, hydrocephalus, extra-axial collection or mass lesion. Vascular: Normal flow voids. Skull and upper cervical spine: Normal marrow signal. Sinuses/Orbits: Negative. IMPRESSION: No evidence of acute intracranial abnormality. Electronically Signed   By: Gilmore GORMAN Molt M.D.   On: 06/07/2024 03:05   CT ANGIO HEAD NECK W WO CM (CODE STROKE) Result Date: 06/07/2024 CLINICAL DATA:  Neuro deficit, acute, stroke suspected EXAM: CT ANGIOGRAPHY HEAD AND NECK WITH AND WITHOUT CONTRAST TECHNIQUE: Multidetector CT imaging of the head and neck was performed using the standard protocol during bolus administration of intravenous contrast. Multiplanar CT image reconstructions and MIPs were obtained to evaluate the vascular anatomy. Carotid stenosis measurements (when applicable) are obtained utilizing NASCET criteria, using the distal internal carotid diameter as the denominator. RADIATION DOSE REDUCTION: This exam was performed according to the departmental dose-optimization program which includes automated exposure  control, adjustment of the mA and/or kV according to patient size and/or use of iterative reconstruction technique. CONTRAST:  75mL OMNIPAQUE  IOHEXOL  350 MG/ML SOLN COMPARISON:  Same day CTA head/neck. FINDINGS: CTA NECK FINDINGS Aortic arch: Great vessel origins are patent. No significant stenosis. Right carotid system: No evidence of dissection, stenosis (50% or greater), or occlusion. Left carotid system: No evidence of dissection, stenosis (50% or greater), or occlusion. Vertebral arteries: Left dominant. No evidence of dissection, stenosis (50% or greater), or occlusion. Skeleton: No acute abnormality on limited assessment. Other neck: No acute abnormality on limited assessment. Upper chest: Visualized lung apices are Review of the MIP images confirms the above findings CTA HEAD FINDINGS Anterior circulation: Bilateral ICAs are patent without significant stenosis. Right MCA and bilateral ACAs are patent significant. M1 MCAs patent. Occlusion of a proximal M3 MCA branch (for example see series 5, image 68 and series 8, image 108). Posterior circulation: Bilateral intradural vertebral arteries, basilar artery and bilateral posterior cerebral arteries are patent without proximal hemodynamically significant stenosis. Venous sinuses: As permitted by contrast timing, patent. 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 Findings discussed with  Dr. Lindzen via 1:39 a.m. Electronically Signed   By: Gilmore GORMAN Molt M.D.   On: 06/07/2024 01:50   CT HEAD CODE STROKE WO CONTRAST Result Date: 06/07/2024 CLINICAL DATA:  Code stroke.  Neuro deficit, acute, stroke suspected EXAM: CT HEAD WITHOUT CONTRAST TECHNIQUE: Contiguous axial images were obtained from the base of the skull through the vertex without intravenous contrast. RADIATION DOSE REDUCTION: This exam was performed according to the departmental dose-optimization program which includes automated  exposure control, adjustment of the mA and/or kV according to patient size and/or use of iterative reconstruction technique. COMPARISON:  CTA head/neck March 23, 2023. FINDINGS: Brain: No evidence of acute infarction, hemorrhage, hydrocephalus, extra-axial collection or mass lesion/mass effect. Vascular: No hyperdense vessel identified. Skull: No acute fracture. Sinuses/Orbits: Clear sinuses.  No acute orbital findings. Other: No mastoid effusions. ASPECTS [facility] Stroke Program Early CT Score) Total score (0-10 with 10 being normal): 10. IMPRESSION: No evidence of acute intracranial abnormality. ASPECTS is 10. Code stroke imaging results were communicated on 06/07/2024 at 1:20 am to provider Dr. Merrianne via secure text paging. Electronically Signed   By: Gilmore GORMAN Molt M.D.   On: 06/07/2024 01:20     PHYSICAL EXAM  Temp:  [97.5 F (36.4 C)-98.7 F (37.1 C)] 98.7 F (37.1 C) (07/10 1132) Pulse Rate:  [72-106] 98 (07/10 1132) Resp:  [14-23] 18 (07/10 1132) BP: (101-139)/(65-92) 139/92 (07/10 1132) SpO2:  [98 %-100 %] 100 % (07/10 1132)  General - Well nourished, well developed, in no apparent distress.  Ophthalmologic - fundi not visualized due to noncooperation.  Cardiovascular - Regular rhythm and rate.  Mental Status -  Level of arousal and orientation to time, place, and person were intact. Language including expression, naming, repetition, comprehension was assessed and found intact. Attention span and concentration were normal. Fund of Knowledge was assessed and was intact.  Cranial Nerves II - XII - II - Visual field intact OU. III, IV, VI - Extraocular movements intact. V - Facial sensation intact bilaterally. VII - Facial movement intact bilaterally. VIII - Hearing & vestibular intact bilaterally. X - Palate elevates symmetrically. XI - Chin turning & shoulder shrug intact bilaterally. XII - Tongue protrusion intact.  Motor Strength - The patient's strength was normal  in all extremities except right lower extremity 4+/5 proximal only.  Bulk was normal and fasciculations were absent.   Motor Tone - Muscle tone was assessed at the neck and appendages and was normal.  Reflexes - The patient's reflexes were symmetrical in all extremities and she had no pathological reflexes.  Sensory - Light touch, temperature/pinprick were assessed and were symmetrical.    Coordination - The patient had normal movements in the hands with no ataxia or dysmetria.  Tremor was absent.  Gait and Station - deferred.   ASSESSMENT/PLAN Sarah Holland is a 33 y.o. female with history of migraine, gestational diabetes and hypertension admitted for right-sided weakness and slurred speech with headache developed after. No TNK given due to mild symptoms.    Stroke: Likely Small left MCA infarct not detected on MRI, embolic pattern, etiology likely PFO related CT no acute abnormality CTA head and neck left M3 occlusion MRI no acute abnormality TCD bubble study Spencer degree 3 with Valsalva 2D Echo EF 60 to 65%, evidence of large PFO TEE large PFO tunneled, very positive bubble study especially with valsalva LE venous Doppler no DVT LDL 77 HgbA1c 5.5 UDS negative Hypercoag workup low level of protein C and S Lovenox  for  VTE prophylaxis No antithrombotic prior to admission, now on aspirin  81 mg daily and clopidogrel  75 mg daily DAPT. Further regimen per cardiology Patient counseled to be compliant with her antithrombotic medications Ongoing aggressive stroke risk factor management Therapy recommendations: Outpatient PT and OT Disposition: Pending  PFO TCD bubble study Spencer degree 3 with Valsalva TEE large PFO tunneled, very positive bubble study especially with valsalva ROPE = 8 84% chance of relationship to stroke Will refer to cardiology for PFO closure  Protein C and S deficiency Hypercoag workup low level of protein C and S LE venous doppler no DVT Usually  not related to arterial thrombus Discussed with Dr. Lonn, will see her urgently next Monday  Migraine 3-4 times per year, need to lie down, goes to dark room and sleep.  Typically headache resolved after sleep Sumatriptan  as needed No prophylactic medication  Hypertension BP high on presentation Stable now Long term BP goal normotensive  Hyperlipidemia Home meds: None LDL 77, goal < 70 No statin for now given childbearing age and LDL not far from goal  Other Stroke Risk Factors Obesity, Body mass index is 35.51 kg/m.  Gestational diabetes Use of provera -depo, which does not have estrogen though  Other Active Problems   Hospital day # 0  Neurology will sign off. Please call with questions. Pt will follow up with stroke clinic NP at [facility] in about 4 weeks. Thanks for the consult.    Ary Cummins, MD PhD Stroke Neurology 06/09/2024 4:21 PM    To contact Stroke Continuity provider, please refer to [email or web address]. After hours, contact General Neurology

## 2024-06-09 NOTE — Transfer of Care (Signed)
 Immediate Anesthesia Transfer of Care Note  Patient: Sarah Holland  Procedure(s) Performed: TRANSESOPHAGEAL ECHOCARDIOGRAM  Patient Location: Cath Lab  Anesthesia Type:General  Level of Consciousness: drowsy and patient cooperative  Airway & Oxygen Therapy: Patient Spontanous Breathing and Patient connected to face mask oxygen  Post-op Assessment: Report given to RN, Post -op Vital signs reviewed and stable, Patient moving all extremities, and Patient moving all extremities X 4  Post vital signs: Reviewed and stable  Last Vitals:  Vitals Value Taken Time  BP 102/41 06/09/24 0914  Temp    Pulse 74 06/09/24 09:14  Resp 19 06/09/24 09:14  SpO2 100 % 06/09/24 09:14  Vitals shown include unfiled device data.  Last Pain:  Vitals:   06/09/24 0842  TempSrc:   PainSc: 0-No pain      Patients Stated Pain Goal: 0 (06/07/24 1600)  Complications: No notable events documented.

## 2024-06-09 NOTE — Plan of Care (Signed)
  Problem: Education: Goal: Knowledge of disease or condition will improve 06/09/2024 0459 by Ann Axel HERO, RN Outcome: Progressing 06/09/2024 0451 by Ann Axel HERO, RN Outcome: Progressing Goal: Knowledge of secondary prevention will improve (MUST DOCUMENT ALL) Outcome: Progressing   Problem: Ischemic Stroke/TIA Tissue Perfusion: Goal: Complications of ischemic stroke/TIA will be minimized Outcome: Progressing

## 2024-06-09 NOTE — CV Procedure (Signed)
 TEE: Anesthesia: Propofol   Large PFO tunneled  Very positive bubble study especially with valsalva EF 60%  No LAA thrombus Normal valves Normal RV No effusion Normal aorta  Patient will be referred to structural for PFO closure in setting of TIA  Maude Emmer MD [facility]

## 2024-06-09 NOTE — Interval H&P Note (Signed)
 History and Physical Interval Note:  06/09/2024 8:33 AM  Sarah Holland  has presented today for surgery, with the diagnosis of stroke.  The various methods of treatment have been discussed with the patient and family. After consideration of risks, benefits and other options for treatment, the patient has consented to  Procedure(s): TRANSESOPHAGEAL ECHOCARDIOGRAM (N/A) as a surgical intervention.  The patient's history has been reviewed, patient examined, no change in status, stable for surgery.  I have reviewed the patient's chart and labs.  Questions were answered to the patient's satisfaction.     Maude Emmer

## 2024-06-09 NOTE — Anesthesia Postprocedure Evaluation (Signed)
 Anesthesia Post Note  Patient: Sarah Holland  Procedure(s) Performed: TRANSESOPHAGEAL ECHOCARDIOGRAM     Patient location during evaluation: Cath Lab Anesthesia Type: MAC Level of consciousness: awake and alert Pain management: pain level controlled Vital Signs Assessment: post-procedure vital signs reviewed and stable Respiratory status: spontaneous breathing, nonlabored ventilation and respiratory function stable Cardiovascular status: stable and blood pressure returned to baseline Postop Assessment: no apparent nausea or vomiting Anesthetic complications: no   No notable events documented.  Last Vitals:  Vitals:   06/09/24 0940 06/09/24 0945  BP: 111/74 117/76  Pulse: 81 86  Resp: 14 15  Temp:    SpO2: 99% 99%    Last Pain:  Vitals:   06/09/24 0940  TempSrc:   PainSc: 0-No pain                 Torry Istre,W. EDMOND

## 2024-06-09 NOTE — Progress Notes (Signed)
  Echocardiogram Echocardiogram Transesophageal has been performed.  Koleen KANDICE Popper, RDCS 06/09/2024, 9:18 AM

## 2024-06-09 NOTE — Progress Notes (Signed)
 Occupational Therapy Treatment Patient Details Name: Sarah Holland MRN: [id number] DOB: 12/24/1990 Today's Date: 06/09/2024   History of present illness 33 year old female who presents ER 06/07/24 as a code stroke secondary to right-sided weakness. NIHSS=4; CT head negative; MRI brain no acute abnormality. 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 PMH-migraines, gestational diabetes, anemia.   OT comments  Patient received in supine and able to get OOB, change socks, and perform grooming tasks standing at sink with no assistance and demonstrated good balance. Patient provided with UE HEP and level 2 therapy band. Patient able to follow HEP with occasional cue to perform correctly. Patient stating decrease RUE grip strength and provided red therapy putty and instructions on grip strengthening exercises. Patient meeting goals, OT to sign off.       If plan is discharge home, recommend the following:  Other (comment) (on pt request)   Equipment Recommendations  None recommended by OT    Recommendations for Other Services      Precautions / Restrictions Precautions Precautions: None       Mobility Bed Mobility Overal bed mobility: Independent             General bed mobility comments: patient able to get to EOB without assistance or use of bed features    Transfers Overall transfer level: Independent Equipment used: None                     Balance Overall balance assessment: No apparent balance deficits (not formally assessed)                                         ADL either performed or assessed with clinical judgement   ADL Overall ADL's : Modified independent                                       General ADL Comments: able to change socks seated on EOB and stood at sink for grooming tasks without assistance    Extremity/Trunk Assessment Upper Extremity Assessment Upper Extremity Assessment: Right hand  dominant RUE Deficits / Details: 4/5 as compared to L 5/5 RUE Sensation: WNL RUE Coordination: decreased gross motor            Vision       Perception     Praxis     Communication Communication Communication: No apparent difficulties   Cognition Arousal: Alert Behavior During Therapy: WFL for tasks assessed/performed               OT - Cognition Comments: alert and oriented x4                 Following commands: Intact        Cueing   Cueing Techniques: Verbal cues  Exercises Exercises: General Upper Extremity, Other exercises General Exercises - Upper Extremity Shoulder Flexion: Strengthening, Both, 15 reps, Seated, Theraband Theraband Level (Shoulder Flexion): Level 2 (Red) Shoulder Horizontal ABduction: Strengthening, Both, 15 reps, Seated, Theraband Theraband Level (Shoulder Horizontal Abduction): Level 2 (Red) Elbow Flexion: Strengthening, Both, 15 reps, Seated, Theraband Theraband Level (Elbow Flexion): Level 2 (Red) Elbow Extension: Strengthening, Both, 15 reps, Seated, Theraband Theraband Level (Elbow Extension): Level 2 (Red) Other Exercises Other Exercises: patient provided with red therapy putty and instructions on grip  strengthening exercises.    Shoulder Instructions       General Comments VSS    Pertinent Vitals/ Pain       Pain Assessment Pain Assessment: No/denies pain Pain Intervention(s): Monitored during session  Home Living                                          Prior Functioning/Environment              Frequency  Min 2X/week (OT to sign off)        Progress Toward Goals  OT Goals(current goals can now be found in the care plan section)  Progress towards OT goals: Progressing toward goals  Acute Rehab OT Goals Patient Stated Goal: to go home OT Goal Formulation: With patient Time For Goal Achievement: 06/21/24 Potential to Achieve Goals: Good ADL Goals Pt/caregiver will Perform  Home Exercise Program: Right Upper extremity;With theraband;With written HEP provided Additional ADL Goal #1: Pt will follow 4 step commands during ADL or path finding without cues Additional ADL Goal #2: Pt will perofrm pillbox test mod I  Plan      Co-evaluation                 AM-PAC OT 6 Clicks Daily Activity     Outcome Measure   Help from another person eating meals?: None Help from another person taking care of personal grooming?: None Help from another person toileting, which includes using toliet, bedpan, or urinal?: None Help from another person bathing (including washing, rinsing, drying)?: None Help from another person to put on and taking off regular upper body clothing?: None Help from another person to put on and taking off regular lower body clothing?: None 6 Click Score: 24    End of Session    OT Visit Diagnosis: Muscle weakness (generalized) (M62.81);Other symptoms and signs involving cognitive function   Activity Tolerance Patient tolerated treatment well   Patient Left in chair;with call bell/phone within reach   Nurse Communication Mobility status        Time: 9247-9186 OT Time Calculation (min): 21 min  Charges: OT General Charges $OT Visit: 1 Visit OT Treatments $Therapeutic Exercise: 8-22 mins  Sarah Holland, OTA Acute Rehabilitation Services  Office [phone]   Sarah Holland 06/09/2024, 8:43 AM

## 2024-06-10 ENCOUNTER — Encounter (HOSPITAL_COMMUNITY)
Admission: EM | Disposition: A | Discharge status: Home / Self Care | Payer: Self-pay | Source: Home / Self Care | Attending: Emergency Medicine

## 2024-06-10 LAB — FACTOR 5 LEIDEN

## 2024-06-13 ENCOUNTER — Inpatient Hospital Stay: Attending: Hematology and Oncology | Admitting: Hematology and Oncology

## 2024-06-13 ENCOUNTER — Inpatient Hospital Stay

## 2024-06-13 ENCOUNTER — Encounter: Payer: Self-pay | Admitting: Hematology and Oncology

## 2024-06-13 VITALS — BP 133/86 | HR 96 | Temp 98.5°F | Resp 18 | Ht 65.0 in | Wt 208.4 lb

## 2024-06-13 DIAGNOSIS — F17200 Nicotine dependence, unspecified, uncomplicated: Secondary | ICD-10-CM | POA: Insufficient documentation

## 2024-06-13 DIAGNOSIS — Q2112 Patent foramen ovale: Secondary | ICD-10-CM | POA: Diagnosis not present

## 2024-06-13 DIAGNOSIS — F17290 Nicotine dependence, other tobacco product, uncomplicated: Secondary | ICD-10-CM | POA: Diagnosis not present

## 2024-06-13 DIAGNOSIS — D509 Iron deficiency anemia, unspecified: Secondary | ICD-10-CM | POA: Diagnosis not present

## 2024-06-13 DIAGNOSIS — Z7982 Long term (current) use of aspirin: Secondary | ICD-10-CM | POA: Diagnosis not present

## 2024-06-13 DIAGNOSIS — Z7902 Long term (current) use of antithrombotics/antiplatelets: Secondary | ICD-10-CM | POA: Insufficient documentation

## 2024-06-13 DIAGNOSIS — I639 Cerebral infarction, unspecified: Secondary | ICD-10-CM | POA: Diagnosis present

## 2024-06-13 LAB — IRON AND IRON BINDING CAPACITY (CC-WL,HP ONLY)
Iron: 115 ug/dL (ref 28–170)
Saturation Ratios: 26 % (ref 10.4–31.8)
TIBC: 447 ug/dL (ref 250–450)
UIBC: 332 ug/dL (ref 148–442)

## 2024-06-13 LAB — CBC WITH DIFFERENTIAL (CANCER CENTER ONLY)
Abs Immature Granulocytes: 0.01 K/uL (ref 0.00–0.07)
Basophils Absolute: 0 K/uL (ref 0.0–0.1)
Basophils Relative: 1 %
Eosinophils Absolute: 0.1 K/uL (ref 0.0–0.5)
Eosinophils Relative: 2 %
HCT: 39.6 % (ref 36.0–46.0)
Hemoglobin: 12.6 g/dL (ref 12.0–15.0)
Immature Granulocytes: 0 %
Lymphocytes Relative: 33 %
Lymphs Abs: 1.4 K/uL (ref 0.7–4.0)
MCH: 24.9 pg — ABNORMAL LOW (ref 26.0–34.0)
MCHC: 31.8 g/dL (ref 30.0–36.0)
MCV: 78.3 fL — ABNORMAL LOW (ref 80.0–100.0)
Monocytes Absolute: 0.4 K/uL (ref 0.1–1.0)
Monocytes Relative: 10 %
Neutro Abs: 2.3 K/uL (ref 1.7–7.7)
Neutrophils Relative %: 54 %
Platelet Count: 266 K/uL (ref 150–400)
RBC: 5.06 MIL/uL (ref 3.87–5.11)
RDW: 13.4 % (ref 11.5–15.5)
WBC Count: 4.3 K/uL (ref 4.0–10.5)
nRBC: 0 % (ref 0.0–0.2)

## 2024-06-13 LAB — PROTHROMBIN GENE MUTATION

## 2024-06-13 LAB — FERRITIN: Ferritin: 84 ng/mL (ref 11–307)

## 2024-06-13 NOTE — Progress Notes (Signed)
 [facility] CONSULT NOTE  Patient Care Team: Sherial Bail, MD as PCP - General (Internal Medicine)   ASSESSMENT & PLAN:   Acute cerebrovascular accident (CVA) [facility]) Her records extensively Her recent acute stroke is likely caused by presence of patent foramina ovale, on background history of vaping along with hormonal manipulation for contraception We discussed avoiding hormonal contraception in the future The patient is instructed to stop vaping She will continue aspirin  and Plavix  until the patent foreman ovale can be closed I will see her again in 3 months for further follow-up  Vaping nicotine dependence, non-tobacco product We discussed importance of cessation from vaping  PFO (patent foramen ovale) She has been referred to see cardiologist for the procedure I recommend she remain on aspirin  and Plavix  for now  Microcytic anemia Her microcytic anemia is likely related to iron deficiency versus undiagnosed hemoglobinopathy such as thalassemia I will order additional workup  Orders Placed This Encounter  Procedures   CBC with Differential (Cancer Center Only)    Standing Status:   Future    Number of Occurrences:   1    Expiration Date:   06/13/2025   Ferritin    Standing Status:   Future    Number of Occurrences:   1    Expiration Date:   06/13/2025   Iron and Iron Binding Capacity ([facility] only)    Standing Status:   Future    Number of Occurrences:   1    Expiration Date:   06/13/2025   Alpha-Thalassemia Analysis    Standing Status:   Future    Number of Occurrences:   1    Expected Date:   06/13/2024    Expiration Date:   06/13/2025    Indication:   for thalassemia eval   Hgb Fractionation Cascade    Standing Status:   Future    Number of Occurrences:   1    Expiration Date:   06/13/2025    All questions were answered. The patient knows to call the clinic with any problems, questions or concerns. The total time spent in the appointment was  80 minutes encounter with patients including review of chart and various tests results, discussions about plan of care and coordination of care plan  Almarie Bedford, MD 7/14/20251:57 PM  CHIEF COMPLAINTS/PURPOSE OF CONSULTATION:  Recent acute stroke, microcytic anemia, questionable thrombophilia disorder  HISTORY OF PRESENTING ILLNESS:  Rolin KATHEE Lesches 33 y.o. 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  At first, she thought it could be due to postural changes.  Then she developed some changes in the vision and right upper arm weakness.  She was not able to talk.  She attempted to walk to get attention from a coworker and her coworker was asking her questions but she was not able to answer back.  Both of them walk to was not the hallway and then she started to lose control of her right upper arm, developed droopiness of her right face, lost control of her right leg and she started to slump to the floor.  Emergency first responder was contacted and she was subsequently brought in by ambulance.  She was diaphoretic.  She had slurred speech.  She denies seizure, bowels or bladder incontinence. Upon arrival to the emergency department, she underwent extensive  evaluation including CT imaging, MRI, echocardiogram, extensive blood work and others.  The patient declined reperfusion procedure.  She was placed on aspirin  and Plavix  with gradual improvement.  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 2) she has microcytic anemia 3) transesophageal echocardiogram showed moderate size tunneled PFO, markedly positive bubble study with Valsalva/cough with normal ejection fraction 4) lipid panel showed total cholesterol of 131  Negative studies including  1) negative  for DVT with venous Doppler ultrasound bilateral lower extremity 2) Negative testing for lupus anticoagulant, Antithrombin III  deficiency, homocystine, factor V Leiden and prothrombin gene mutation 3) Normal hemoglobin A1c Protein C and protein S deficiency cannot be diagnosed in the setting of acute clot so the results are consider not interpretable  She denies recent history of trauma, long distance travel, dehydration, recent surgery, or prolonged immobilization. Her major risk factors including hormonal injection for birth control with Depo-Provera  chronically every 3 months for years as well as chronic vaping  She had no prior history or diagnosis of cancer. Her age appropriate screening programs are up-to-date. She had prior surgeries before and never had perioperative thromboembolic events. The patient had been pregnant before and denies history of peripartum thromboembolic event or history of recurrent miscarriages. There is no family history of blood clots or miscarriages.  Today, she has much improved neurological recovery.  Her speech is back to normal.  Her vision is normal.  She has mild residual weakness in her hands and arms and reduced sensation throughout her right upper extremity.  She has not begun outpatient therapy yet  MEDICAL HISTORY:  Past Medical History:  Diagnosis Date   Elevated blood pressure complicating pregnancy in third trimester, antepartum 09/27/2019   Encounter for induction of labor 09/28/2019   Family history of pancreatic cancer    8/23 cancer genetic testing letter sent   Gestational diabetes    Gestational hypertension 09/28/2019   Obesity (BMI 30-39.9)    Obesity complicating peripregnancy, antepartum 08/09/2019   Supervision of high risk pregnancy, antepartum 04/05/2019   [facility] Prenatal Labs Dating LMP Blood type: B/Positive/-- (04/22 1503)  Genetic Screen NIPS: Negative XY Antibody:Negative (04/22 1503) Anatomic US  Complete  05/16/2019 Rubella: 3.96 (04/22 1503) Varicella: Immune GTT Early:               Third trimester: 204; 3 hr 98/219/204/140- RPR: Non Reactive (04/22 1503)  Rhogam N/A HBsAg: Negative (04/22 1503)  TDaP vaccine        08/02/19                SURGICAL HISTORY: Past Surgical History:  Procedure Laterality Date   APPENDECTOMY     TRANSESOPHAGEAL ECHOCARDIOGRAM (CATH LAB) N/A 06/09/2024   Procedure: TRANSESOPHAGEAL ECHOCARDIOGRAM;  Surgeon: Delford Maude BROCKS, MD;  Location: [facility] INVASIVE CV LAB;  Service: Cardiovascular;  Laterality: N/A;    SOCIAL HISTORY: Social History   Socioeconomic History   Marital status: Married    Spouse name: Not on file   Number of children: 3   Years of education: Not on file   Highest education level: Not on file  Occupational History   Not on file  Tobacco Use   Smoking status: Former    Current packs/day: 0.00    Average packs/day: 0.3 packs/day for 3.0 years (0.8 ttl pk-yrs)    Types: Cigarettes    Start date: 12/01/2014    Quit date: 12/01/2017    Years  since quitting: 6.5   Smokeless tobacco: Never  Vaping Use   Vaping status: Never Used  Substance and Sexual Activity   Alcohol use: No   Drug use: Never   Sexual activity: Yes    Birth control/protection: Injection  Other Topics Concern   Not on file  Social History Narrative   Not on file   Social Drivers of Health   Financial Resource Strain: Low Risk  (03/24/2024)   Received from [facility]   Overall Financial Resource Strain (CARDIA)    Difficulty of Paying Living Expenses: Not hard at all  Food Insecurity: No Food Insecurity (06/07/2024)   Hunger Vital Sign    Worried About Running Out of Food in the Last Year: Never true    Ran Out of Food in the Last Year: Never true  Transportation Needs: No Transportation Needs (06/07/2024)   PRAPARE - Administrator, Civil Service (Medical): No    Lack of Transportation (Non-Medical): No  Physical Activity: Not on file   Stress: Not on file  Social Connections: Not on file  Intimate Partner Violence: Not At Risk (06/07/2024)   Humiliation, Afraid, Rape, and Kick questionnaire    Fear of Current or Ex-Partner: No    Emotionally Abused: No    Physically Abused: No    Sexually Abused: No    FAMILY HISTORY: Family History  Problem Relation Age of Onset   Liver cancer Father 43   Diabetes Maternal Grandmother    Pancreatic cancer Maternal Grandmother     ALLERGIES:  has no known allergies.  MEDICATIONS:  Current Outpatient Medications  Medication Sig Dispense Refill   aspirin  EC 81 MG tablet Take 1 tablet (81 mg total) by mouth daily. Swallow whole. 30 tablet 1   clopidogrel  (PLAVIX ) 75 MG tablet Take 1 tablet (75 mg total) by mouth daily. 30 tablet 1   No current facility-administered medications for this visit.    REVIEW OF SYSTEMS:   Constitutional: Denies fevers, chills or abnormal night sweats Eyes: Denies blurriness of vision, double vision or watery eyes Ears, nose, mouth, throat, and face: Denies mucositis or sore throat Respiratory: Denies cough, dyspnea or wheezes Cardiovascular: Denies palpitation, chest discomfort or lower extremity swelling Gastrointestinal:  Denies nausea, heartburn or change in bowel habits Skin: Denies abnormal skin rashes Lymphatics: Denies new lymphadenopathy or easy bruising Behavioral/Psych: Mood is stable, no new changes  All other systems were reviewed with the patient and are negative.  PHYSICAL EXAMINATION: ECOG PERFORMANCE STATUS: 0 - Asymptomatic  Vitals:   06/13/24 1306  BP: 133/86  Pulse: 96  Resp: 18  Temp: 98.5 F (36.9 C)  SpO2: 100%   Filed Weights   06/13/24 1306  Weight: 208 lb 6.4 oz (94.5 kg)    GENERAL:alert, no distress and comfortable SKIN: skin color, texture, turgor are normal, no rashes or significant lesions EYES: normal, conjunctiva are pink and non-injected, sclera clear OROPHARYNX:no exudate, no erythema and lips,  buccal mucosa, and tongue normal  NECK: supple, thyroid normal size, non-tender, without nodularity LYMPH:  no palpable lymphadenopathy in the cervical, axillary or inguinal LUNGS: clear to auscultation and percussion with normal breathing effort HEART: regular rate & rhythm and no murmurs and no lower extremity edema ABDOMEN:abdomen soft, non-tender and normal bowel sounds Musculoskeletal:no cyanosis of digits and no clubbing  PSYCH: alert & oriented x 3 with fluent speech NEURO: Noted focal weakness on the right upper extremity  LABORATORY DATA:  I have reviewed the  data as listed Lab Results  Component Value Date   WBC 5.0 06/08/2024   HGB 11.6 (L) 06/08/2024   HCT 36.3 06/08/2024   MCV 79.8 (L) 06/08/2024   PLT 233 06/08/2024     RADIOGRAPHIC STUDIES: I have personally reviewed the radiological images as listed and agreed with the findings in the report. ECHO TEE Result Date: 06/09/2024    TRANSESOPHOGEAL ECHO REPORT   Patient Name:   MARLAYSIA LENIG Date of Exam: 06/09/2024 Medical Rec #:  [id number]        Height:       65.0 in Accession #:    [id number]       Weight:       213.4 lb Date of Birth:  01/12/1991        BSA:          2.033 m Patient Age:    32 years         BP:           131/87 mmHg Patient Gender: F                HR:           87 bpm. Exam Location:  Inpatient Procedure: Transesophageal Echo, 3D Echo, Color Doppler and Saline Contrast            Bubble Study (Both Spectral and Color Flow Doppler were utilized            during procedure). Indications:     PFO  History:         Patient has prior history of Echocardiogram examinations, most                  recent 06/07/2024. Risk Factors:Former Smoker.  Sonographer:     Koleen Popper RDCS Referring Phys:  [id number] ZANE ADAMS Diagnosing Phys: Maude Emmer MD PROCEDURE: After discussion of the risks and benefits of a TEE, an informed consent was obtained from the patient. The transesophogeal probe was passed without difficulty  through the esophogus of the patient. Imaged were obtained with the patient in a left lateral decubitus position. Sedation performed by different physician. The patient was monitored while under deep sedation. Anesthestetic sedation was provided intravenously by Anesthesiology: 350mg  of Propofol , 40mg  of Lidocaine . Image quality was good. The patient's vital signs; including heart rate, blood pressure, and oxygen saturation; remained stable throughout the procedure. The patient developed no complications during the procedure.  IMPRESSIONS  1. Moderate sized tunneled PFO Markedly positive bubble study particularly with valsalva/cough.  2. Refer to structural team for PFO closure.  3. Left ventricular ejection fraction, by estimation, is 60 to 65%. The left ventricle has normal function. The left ventricle has no regional wall motion abnormalities.  4. Right ventricular systolic function is normal. The right ventricular size is normal.  5. No left atrial/left atrial appendage thrombus was detected.  6. The mitral valve is normal in structure. No evidence of mitral valve regurgitation. No evidence of mitral stenosis.  7. The aortic valve is normal in structure. Aortic valve regurgitation is not visualized. No aortic stenosis is present.  8. The inferior vena cava is normal in size with greater than 50% respiratory variability, suggesting right atrial pressure of 3 mmHg.  9. Evidence of atrial level shunting detected by color flow Doppler. Agitated saline contrast bubble study was positive with shunting observed within 3-6 cardiac cycles suggestive of interatrial shunt. There is a moderately sized patent foramen ovale with predominantly  left to right shunting across the atrial septum. 10. 3D performed of the atrial septum and demonstrates 3D of PFO performed. Conclusion(s)/Recommendation(s): Normal biventricular function without evidence of hemodynamically significant valvular heart disease. FINDINGS  Left Ventricle:  Left ventricular ejection fraction, by estimation, is 60 to 65%. The left ventricle has normal function. The left ventricle has no regional wall motion abnormalities. The left ventricular internal cavity size was normal in size. There is  no left ventricular hypertrophy. Right Ventricle: The right ventricular size is normal. No increase in right ventricular wall thickness. Right ventricular systolic function is normal. Left Atrium: Left atrial size was normal in size. No left atrial/left atrial appendage thrombus was detected. Right Atrium: Right atrial size was normal in size. Pericardium: There is no evidence of pericardial effusion. Mitral Valve: The mitral valve is normal in structure. No evidence of mitral valve regurgitation. No evidence of mitral valve stenosis. Tricuspid Valve: The tricuspid valve is normal in structure. Tricuspid valve regurgitation is not demonstrated. No evidence of tricuspid stenosis. Aortic Valve: The aortic valve is normal in structure. Aortic valve regurgitation is not visualized. No aortic stenosis is present. Pulmonic Valve: The pulmonic valve was normal in structure. Pulmonic valve regurgitation is not visualized. No evidence of pulmonic stenosis. Aorta: The aortic root is normal in size and structure. Venous: The inferior vena cava is normal in size with greater than 50% respiratory variability, suggesting right atrial pressure of 3 mmHg. IAS/Shunts: Evidence of atrial level shunting detected by color flow Doppler. Agitated saline contrast was given intravenously to evaluate for intracardiac shunting. Agitated saline contrast bubble study was positive with shunting observed within 3-6 cardiac cycles suggestive of interatrial shunt. A moderately sized patent foramen ovale is detected with predominantly left to right shunting across the atrial septum. Additional Comments: Moderate sized tunneled PFO Markedly positive bubble study particularly with valsalva/cough. Refer to structural  team for PFO closure. 3D was performed not requiring image post processing on an independent workstation and was abnormal. LEFT VENTRICLE PLAX 2D LVOT diam:     2.10 cm LVOT Area:     3.46 cm   AORTA Ao Root diam: 2.60 cm  SHUNTS Systemic Diam: 2.10 cm Maude Emmer MD Electronically signed by Maude Emmer MD Signature Date/Time: 06/09/2024/9:48:33 AM    Final    EP STUDY Result Date: 06/09/2024 See surgical note for result.  VAS US  LOWER EXTREMITY VENOUS (DVT) Result Date: 06/08/2024  Lower Venous DVT Study Patient Name:  DEJON JUNGMAN  Date of Exam:   06/08/2024 Medical Rec #: [id number]         Accession #:    [id number] Date of Birth: 1991-04-06         Patient Gender: F Patient Age:   89 years Exam Location:  [facility] Procedure:      VAS US  LOWER EXTREMITY VENOUS (DVT) Referring Phys: ARY XU --------------------------------------------------------------------------------  Indications: Stroke, and Small PFO.  Comparison Study: No priors. Performing Technologist: Ricka Sturdivant-Jones RDMS, RVT  Examination Guidelines: A complete evaluation includes B-mode imaging, spectral Doppler, color Doppler, and power Doppler as needed of all accessible portions of each vessel. Bilateral testing is considered an integral part of a complete examination. Limited examinations for reoccurring indications may be performed as noted. The reflux portion of the exam is performed with the patient in reverse Trendelenburg.  +---------+---------------+---------+-----------+----------+--------------+ RIGHT    CompressibilityPhasicitySpontaneityPropertiesThrombus Aging +---------+---------------+---------+-----------+----------+--------------+ CFV      Full           Yes  Yes                                 +---------+---------------+---------+-----------+----------+--------------+ SFJ      Full                                                         +---------+---------------+---------+-----------+----------+--------------+ FV Prox  Full                                                        +---------+---------------+---------+-----------+----------+--------------+ FV Mid   Full                                                        +---------+---------------+---------+-----------+----------+--------------+ FV DistalFull                                                        +---------+---------------+---------+-----------+----------+--------------+ PFV      Full                                                        +---------+---------------+---------+-----------+----------+--------------+ POP      Full           Yes      Yes                                 +---------+---------------+---------+-----------+----------+--------------+ PTV      Full                                                        +---------+---------------+---------+-----------+----------+--------------+ PERO     Full                                                        +---------+---------------+---------+-----------+----------+--------------+   +---------+---------------+---------+-----------+----------+--------------+ LEFT     CompressibilityPhasicitySpontaneityPropertiesThrombus Aging +---------+---------------+---------+-----------+----------+--------------+ CFV      Full           Yes      Yes                                 +---------+---------------+---------+-----------+----------+--------------+ SFJ      Full                                                        +---------+---------------+---------+-----------+----------+--------------+  FV Prox  Full                                                        +---------+---------------+---------+-----------+----------+--------------+ FV Mid   Full                                                         +---------+---------------+---------+-----------+----------+--------------+ FV DistalFull                                                        +---------+---------------+---------+-----------+----------+--------------+ PFV      Full                                                        +---------+---------------+---------+-----------+----------+--------------+ POP      Full           Yes      Yes                                 +---------+---------------+---------+-----------+----------+--------------+ PTV      Full                                                        +---------+---------------+---------+-----------+----------+--------------+     Summary: BILATERAL: - No evidence of deep vein thrombosis seen in the lower extremities, bilaterally. -No evidence of popliteal cyst, bilaterally.   *See table(s) above for measurements and observations. Electronically signed by Debby Robertson on 06/08/2024 at 5:03:53 PM.    Final    VAS US  TRANSCRANIAL DOPPLER W BUBBLES Result Date: 06/07/2024  Transcranial Doppler with Bubble Patient Name:  CORLISS COGGESHALL  Date of Exam:   06/07/2024 Medical Rec #: [id number]         Accession #:    [id number] Date of Birth: 1990-12-04         Patient Gender: F Patient Age:   63 years Exam Location[facility]  [facility] Procedure:      VAS US  TRANSCRANIAL DOPPLER W BUBBLES Referring Phys: ARY XU --------------------------------------------------------------------------------  Indications: Stroke. Comparison Study: No previous exams Performing Technologist: Jody Hill RVT, RDMS  Examination Guidelines: A complete evaluation includes B-mode imaging, spectral Doppler, color Doppler, and power Doppler as needed of all accessible portions of each vessel. Bilateral testing is considered an integral part of a complete examination. Limited examinations for reoccurring indications may be performed as noted.  Summary:  A vascular evaluation was performed. The left  middle cerebral artery was studied. An IV was inserted into the patient's left forearm. Verbal informed consent was obtained.  Less than 10 HITS detected at  rest, indicating a Spencer Grade 1 PFO. With valsalva, between 30-100 HITs detected, indicating a Spencer Grade 3 PFO. clinical correlation recommended *See table(s) above for TCD measurements and observations.  Diagnosing physician: Ary Cummins MD Electronically signed by Ary Cummins MD on 06/07/2024 at 5:32:47 PM.    Final    ECHOCARDIOGRAM COMPLETE Result Date: 06/07/2024    ECHOCARDIOGRAM REPORT   Patient Name:   DORETHY TOMEY Date of Exam: 06/07/2024 Medical Rec #:  [id number]        Height:       65.0 in Accession #:    [id number]       Weight:       213.4 lb Date of Birth:  12/10/1990        BSA:          2.033 m Patient Age:    32 years         BP:           119/60 mmHg Patient Gender: F                HR:           71 bpm. Exam Location:  Inpatient Procedure: 2D Echo, Cardiac Doppler and Color Doppler (Both Spectral and Color            Flow Doppler were utilized during procedure). Indications:    Stroke  History:        Patient has no prior history of Echocardiogram examinations.  Sonographer:    Philomena Daring Referring Phys: [id number] KATY L FOUST IMPRESSIONS  1. Left ventricular ejection fraction, by estimation, is 60 to 65%. The left ventricle has normal function. The left ventricle has no regional wall motion abnormalities. Left ventricular diastolic parameters were normal.  2. Right ventricular systolic function is normal. The right ventricular size is normal.  3. The mitral valve is normal in structure. No evidence of mitral valve regurgitation. No evidence of mitral stenosis.  4. The aortic valve is tricuspid. Aortic valve regurgitation is not visualized. No aortic stenosis is present.  5. The inferior vena cava is normal in size with greater than 50% respiratory variability, suggesting right atrial pressure of 3 mmHg.  6. Evidence of atrial level  shunting detected by color flow Doppler. Agitated saline contrast bubble study was positive with shunting observed within 3-6 cardiac cycles suggestive of interatrial shunt. There is a large patent foramen ovale. Comparison(s): No prior Echocardiogram. FINDINGS  Left Ventricle: Left ventricular ejection fraction, by estimation, is 60 to 65%. The left ventricle has normal function. The left ventricle has no regional wall motion abnormalities. The left ventricular internal cavity size was normal in size. There is  no left ventricular hypertrophy. Left ventricular diastolic parameters were normal. Right Ventricle: The right ventricular size is normal. No increase in right ventricular wall thickness. Right ventricular systolic function is normal. Left Atrium: Left atrial size was normal in size. Right Atrium: Right atrial size was normal in size. Pericardium: There is no evidence of pericardial effusion. Mitral Valve: The mitral valve is normal in structure. No evidence of mitral valve regurgitation. No evidence of mitral valve stenosis. Tricuspid Valve: The tricuspid valve is normal in structure. Tricuspid valve regurgitation is not demonstrated. No evidence of tricuspid stenosis. Aortic Valve: The aortic valve is tricuspid. Aortic valve regurgitation is not visualized. No aortic stenosis is present. Pulmonic Valve: The pulmonic valve was normal in structure. Pulmonic valve regurgitation is mild. No evidence of pulmonic stenosis. Aorta: The  aortic root and ascending aorta are structurally normal, with no evidence of dilitation, the aortic root is normal in size and structure and the aortic root, ascending aorta and aortic arch are all structurally normal, with no evidence of dilitation or obstruction. Venous: The inferior vena cava is normal in size with greater than 50% respiratory variability, suggesting right atrial pressure of 3 mmHg. IAS/Shunts: Evidence of atrial level shunting detected by color flow Doppler.  Agitated saline contrast was given intravenously to evaluate for intracardiac shunting. Agitated saline contrast bubble study was positive with shunting observed within 3-6 cardiac cycles suggestive of interatrial shunt. A large patent foramen ovale is detected.  LEFT VENTRICLE PLAX 2D LVIDd:         4.20 cm   Diastology LVIDs:         3.00 cm   LV e' medial:    13.70 cm/s LV PW:         0.90 cm   LV E/e' medial:  6.7 LV IVS:        0.90 cm   LV e' lateral:   16.80 cm/s LVOT diam:     2.00 cm   LV E/e' lateral: 5.5 LV SV:         57 LV SV Index:   28 LVOT Area:     3.14 cm  RIGHT VENTRICLE             IVC RV S prime:     12.50 cm/s  IVC diam: 1.40 cm TAPSE (M-mode): 1.8 cm LEFT ATRIUM             Index        RIGHT ATRIUM           Index LA diam:        3.00 cm 1.48 cm/m   RA Area:     11.10 cm LA Vol (A2C):   42.1 ml 20.70 ml/m  RA Volume:   22.40 ml  11.02 ml/m LA Vol (A4C):   41.3 ml 20.31 ml/m LA Biplane Vol: 41.7 ml 20.51 ml/m  AORTIC VALVE LVOT Vmax:   93.80 cm/s LVOT Vmean:  64.500 cm/s LVOT VTI:    0.183 m  AORTA Ao Root diam: 2.50 cm Ao Asc diam:  2.70 cm MITRAL VALVE MV Area (PHT): 3.72 cm    SHUNTS MV Decel Time: 204 msec    Systemic VTI:  0.18 m MV E velocity: 91.70 cm/s  Systemic Diam: 2.00 cm MV A velocity: 63.90 cm/s MV E/A ratio:  1.44 Stanly Leavens MD Electronically signed by Stanly Leavens MD Signature Date/Time: 06/07/2024/4:53:27 PM    Final    MR BRAIN WO CONTRAST Result Date: 06/07/2024 CLINICAL DATA:  Stroke, follow up EXAM: MRI HEAD WITHOUT CONTRAST TECHNIQUE: Multiplanar, multiecho pulse sequences of the brain and surrounding structures were obtained without intravenous contrast. COMPARISON:  CT head from earlier today. FINDINGS: Brain: No acute infarction, hemorrhage, hydrocephalus, extra-axial collection or mass lesion. Vascular: Normal flow voids. Skull and upper cervical spine: Normal marrow signal. Sinuses/Orbits: Negative. IMPRESSION: No evidence of acute  intracranial abnormality. Electronically Signed   By: Gilmore GORMAN Molt M.D.   On: 06/07/2024 03:05   CT ANGIO HEAD NECK W WO CM (CODE STROKE) Result Date: 06/07/2024 CLINICAL DATA:  Neuro deficit, acute, stroke suspected EXAM: CT ANGIOGRAPHY HEAD AND NECK WITH AND WITHOUT CONTRAST TECHNIQUE: Multidetector CT imaging of the head and neck was performed using the standard protocol during bolus administration of intravenous contrast. Multiplanar CT image  reconstructions and MIPs were obtained to evaluate the vascular anatomy. Carotid stenosis measurements (when applicable) are obtained utilizing NASCET criteria, using the distal internal carotid diameter as the denominator. RADIATION DOSE REDUCTION: This exam was performed according to the departmental dose-optimization program which includes automated exposure control, adjustment of the mA and/or kV according to patient size and/or use of iterative reconstruction technique. CONTRAST:  75mL OMNIPAQUE  IOHEXOL  350 MG/ML SOLN COMPARISON:  Same day CTA head/neck. FINDINGS: CTA NECK FINDINGS Aortic arch: Great vessel origins are patent. No significant stenosis. Right carotid system: No evidence of dissection, stenosis (50% or greater), or occlusion. Left carotid system: No evidence of dissection, stenosis (50% or greater), or occlusion. Vertebral arteries: Left dominant. No evidence of dissection, stenosis (50% or greater), or occlusion. Skeleton: No acute abnormality on limited assessment. Other neck: No acute abnormality on limited assessment. Upper chest: Visualized lung apices are Review of the MIP images confirms the above findings CTA HEAD FINDINGS Anterior circulation: Bilateral ICAs are patent without significant stenosis. Right MCA and bilateral ACAs are patent significant. M1 MCAs patent. Occlusion of a proximal M3 MCA branch (for example see series 5, image 68 and series 8, image 108). Posterior circulation: Bilateral intradural vertebral arteries, basilar  artery and bilateral posterior cerebral arteries are patent without proximal hemodynamically significant stenosis. Venous sinuses: As permitted by contrast timing, patent. 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 Findings discussed with Dr. Lindzen via 1:39 a.m. Electronically Signed   By: Gilmore GORMAN Molt M.D.   On: 06/07/2024 01:50   CT HEAD CODE STROKE WO CONTRAST Result Date: 06/07/2024 CLINICAL DATA:  Code stroke.  Neuro deficit, acute, stroke suspected EXAM: CT HEAD WITHOUT CONTRAST TECHNIQUE: Contiguous axial images were obtained from the base of the skull through the vertex without intravenous contrast. RADIATION DOSE REDUCTION: This exam was performed according to the departmental dose-optimization program which includes automated exposure control, adjustment of the mA and/or kV according to patient size and/or use of iterative reconstruction technique. COMPARISON:  CTA head/neck March 23, 2023. FINDINGS: Brain: No evidence of acute infarction, hemorrhage, hydrocephalus, extra-axial collection or mass lesion/mass effect. Vascular: No hyperdense vessel identified. Skull: No acute fracture. Sinuses/Orbits: Clear sinuses.  No acute orbital findings. Other: No mastoid effusions. ASPECTS [facility] Stroke Program Early CT Score) Total score (0-10 with 10 being normal): 10. IMPRESSION: No evidence of acute intracranial abnormality. ASPECTS is 10. Code stroke imaging results were communicated on 06/07/2024 at 1:20 am to provider Dr. Merrianne via secure text paging. Electronically Signed   By: Gilmore GORMAN Molt M.D.   On: 06/07/2024 01:20

## 2024-06-13 NOTE — Assessment & Plan Note (Signed)
 We discussed importance of cessation from vaping

## 2024-06-13 NOTE — Assessment & Plan Note (Signed)
 Her microcytic anemia is likely related to iron deficiency versus undiagnosed hemoglobinopathy such as thalassemia I will order additional workup

## 2024-06-13 NOTE — Assessment & Plan Note (Signed)
 Her records extensively Her recent acute stroke is likely caused by presence of patent foramina ovale, on background history of vaping along with hormonal manipulation for contraception We discussed avoiding hormonal contraception in the future The patient is instructed to stop vaping She will continue aspirin  and Plavix  until the patent foreman ovale can be closed I will see her again in 3 months for further follow-up

## 2024-06-13 NOTE — Assessment & Plan Note (Signed)
 She has been referred to see cardiologist for the procedure I recommend she remain on aspirin  and Plavix  for now

## 2024-06-15 LAB — HGB FRACTIONATION CASCADE
Hgb A2: 3 % (ref 1.8–3.2)
Hgb A: 96.4 % (ref 96.4–98.8)
Hgb F: 0.6 % (ref 0.0–2.0)
Hgb S: 0 %

## 2024-06-15 NOTE — Progress Notes (Signed)
 Chief Complaint:   Chief Complaint  Patient presents with  . hosptial follow up    Subjective:   Sarah Holland is a 33 y.o. female in today for hospital discharge follow up  History of Present Illness Sarah Holland is a 33 year old female with a history of stroke who presents for follow-up after hospitalization.  Admitted to hospital on 06/07/2024 and discharged on 06/09/2024.  Patient has been experiencing strokelike symptoms and was transferred to to hospital from her workplace.  She was having difficulty speaking, right-sided tingling numbness and weakness.  tPA was not given in the emergency room due to milder symptoms and the symptoms were already improving.  Further stroke workup showed small left-sided MCA infarct, CTA showing a large patent foramen ovale.  Patient is scheduled to follow-up with cardiology regarding surgery for PFO closure.  She has appointment with neurology as well.  Since her cholesterol number was within normal limits, she has not been started on any statin at discharge.  She is on aspirin  and Plavix  for now.  Hypercoagulability workup showed lower protein C and protein S levels and hence she was seen by hematology who have recommended repeat testing to be done in 3 months.  Most of her symptoms are back to normal except she still experiences slight weakness in the right hand.  Gait is back to normal but occasionally she feels like she is stumbling a little bit because of weakness in the right leg.  She has not started outpatient physical therapy or occupational therapy yet.  Has not been driving yet as she is scared for any falls and or accidents.      Current Outpatient Medications  Medication Sig Dispense Refill  . aspirin  81 MG EC tablet Take 81 mg by mouth once daily    . clopidogreL  (PLAVIX ) 75 mg tablet Take 75 mg by mouth once daily     No current facility-administered medications for this visit.    Allergies as of 06/15/2024  . (No Known Allergies)     Past Medical History:  Diagnosis Date  . Anemia   . Gestational diabetes (HHS-HCC)   . Gestational hypertension (HHS-HCC) 09/28/2019  . History of abnormal cervical Pap smear 2022    Past Surgical History:  Procedure Laterality Date  . APPENDECTOMY  2006  . ABDOMINOPLASTY  01/31/2021  . LIPOSUCTION TRUNK  01/31/2021  . COMBINED ABDOMINOPLASTY AND LIPOSUCTION  01/31/2021   Abeer Sawwaf,MD; [facility],     Family History  Problem Relation Name Age of Onset  . Cancer Father    . Cancer Maternal Grandmother Orlean Finder   . Diabetes Maternal Grandmother Orlean Finder   . Diabetes type II Maternal Grandmother Orlean Finder   . Colon cancer Neg Hx      Social History:  reports that she quit smoking about 6 years ago. Her smoking use included cigarettes. She started smoking about 9 years ago. She has a 0.8 pack-year smoking history. She has been exposed to tobacco smoke. She has never used smokeless tobacco. She reports that she does not currently use alcohol. She reports that she does not use drugs.  Results for orders placed or performed in visit on 03/17/24  CBC w/auto Differential (5 Part)  Result Value Ref Range   WBC (White Blood Cell Count) 3.6 (L) 4.1 - 10.2 10^3/uL   RBC (Red Blood Cell Count) 4.66 4.04 - 5.48 10^6/uL   Hemoglobin 12.1 12.0 - 15.0 gm/dL   Hematocrit 62.0 64.9 -  47.0 %   MCV (Mean Corpuscular Volume) 81.3 80.0 - 100.0 fl   MCH (Mean Corpuscular Hemoglobin) 26.0 (L) 27.0 - 31.2 pg   MCHC (Mean Corpuscular Hemoglobin Concentration) 31.9 (L) 32.0 - 36.0 gm/dL   Platelet Count 745 849 - 450 10^3/uL   RDW-CV (Red Cell Distribution Width) 14.1 11.6 - 14.8 %   MPV (Mean Platelet Volume) 10.8 9.4 - 12.4 fl   Neutrophils 1.48 (L) 1.50 - 7.80 10^3/uL   Lymphocytes 1.65 1.00 - 3.60 10^3/uL   Monocytes 0.27 0.00 - 1.50 10^3/uL   Eosinophils 0.18 0.00 - 0.55 10^3/uL   Basophils 0.05 0.00 - 0.09 10^3/uL   Neutrophil % 40.7 32.0 - 70.0 %    Lymphocyte % 45.5 10.0 - 50.0 %   Monocyte % 7.4 4.0 - 13.0 %   Eosinophil % 5.0 1.0 - 5.0 %   Basophil% 1.4 0.0 - 2.0 %   Immature Granulocyte % 0.0 <=0.7 %   Immature Granulocyte Count 0.00 <=0.06 10^3/L  Comprehensive Metabolic Panel (CMP)  Result Value Ref Range   Glucose 114 (H) 70 - 110 mg/dL   Sodium 859 863 - 854 mmol/L   Potassium 4.6 3.6 - 5.1 mmol/L   Chloride 110 (H) 97 - 109 mmol/L   Carbon Dioxide (CO2) 23.8 22.0 - 32.0 mmol/L   Urea Nitrogen (BUN) 16 7 - 25 mg/dL   Creatinine 1.0 0.6 - 1.1 mg/dL   Glomerular Filtration Rate (eGFR) 76 >60 mL/min/1.73sq m   Calcium 9.3 8.7 - 10.3 mg/dL   AST  15 8 - 39 U/L   ALT  21 5 - 38 U/L   Alk Phos (alkaline Phosphatase) 41 34 - 104 U/L   Albumin 4.7 3.5 - 4.8 g/dL   Bilirubin, Total 0.4 0.3 - 1.2 mg/dL   Protein, Total 7.1 6.1 - 7.9 g/dL   A/G Ratio 2.0 1.0 - 5.0 gm/dL  Hemoglobin J8R  Result Value Ref Range   Hemoglobin A1C 6.0 (H) 4.2 - 5.6 %   Average Blood Glucose (Calc) 126 mg/dL   Narrative   Normal Range:    4.2 - 5.6% Increased Risk:  5.7 - 6.4% Diabetes:        >= 6.5% Glycemic Control for adults with diabetes:  <7%    Lipid Panel w/calc LDL  Result Value Ref Range   Cholesterol, Total 140 100 - 200 mg/dL   Triglyceride 58 35 - 199 mg/dL   HDL (High Density Lipoprotein) Cholesterol 43.7 35.0 - 85.0 mg/dL   LDL Calculated 85 0 - 130 mg/dL   VLDL Cholesterol 12 mg/dL   Cholesterol/HDL Ratio 3.2   Thyroid Stimulating-Hormone (TSH)  Result Value Ref Range   Thyroid Stimulating Hormone (TSH) 0.794 0.450-5.330 uIU/ml uIU/mL  Urinalysis w/Microscopic  Result Value Ref Range   Color Light Yellow Colorless, Straw, Light Yellow, Yellow, Dark Yellow   Clarity Clear Clear   Specific Gravity 1.025 1.005 - 1.030   pH, Urine 6.0 5.0 - 8.0   Protein, Urinalysis Negative Negative mg/dL   Glucose, Urinalysis Negative Negative mg/dL   Ketones, Urinalysis Negative Negative mg/dL   Blood, Urinalysis Negative Negative    Nitrite, Urinalysis Negative Negative   Leukocyte Esterase, Urinalysis Negative Negative   Bilirubin, Urinalysis Negative Negative   Urobilinogen, Urinalysis 0.2 0.2 - 1.0 mg/dL   WBC, UA 2 <=5 /hpf   Red Blood Cells, Urinalysis <1 <=3 /hpf   Bacteria, Urinalysis 0-5 0 - 5 /hpf   Squamous Epithelial Cells, Urinalysis 0 /hpf  Vitamin D, 25-Hydroxy - [facility]  Result Value Ref Range   Vitamin D, 25-Hydroxy - [facility] 25.2 (L) 30.0 - 100.0 ng/mL   Narrative   Performed at:  [address], [place], [place]  [id number] Lab Director: Frankey Sas MD, Phone:  [phone]      ROS:  General: No fever, chills or recent illness. No change in weight HEENT: No change in vision or hearing. No pain or difficulty with swallowing Respiratory: No cough or shortness of breath CV:  No chest pain or palpitations GI:  No pain, dyspepsia or change in bowel habits GU:  No dysuria, frequency, or hesitancy MSK:  No joint pain or injury, minimal weakness of the right hand and right leg. Neurological: No headaches, changes in mental status, loss of sensation or strength  Endocrine:  No heat or cold intolerance, polydipsia, polyuria Psychological: No anxiety insomnia or depression  Objective:   Body mass index is 33.57 kg/m.  BP (!) 132/90   Pulse 89   Ht 167.6 cm (5' 6)   Wt 94.3 kg (208 lb)   SpO2 99%   BMI 33.57 kg/m   General: WD/WN female, in no acute distress HEENT: Pupils equal and round, EOMI. oral mucosa moist.  Oropharynx clear. Neck: supple, trachea midline; no thyromegaly Respiratory:clear to auscultation.  No dullness to percussion.  No use of accessory muscles. Cardiac:  Regular rate and rhythm without murmur, gallops, or rubs Vascular: No carotid bruit and radials 2+; distal pulses 2+ Abdominal:soft, nontender, positive bowel sounds.  No organomegaly. Musculoskeletal:  No clubbing, cyanosis or edema.  Full range of motion in upper and lower extremities  bilaterally. Neuro: CN grossly intact.  No acute decrease in sensation in the upper and lower extremities bilaterally.  Strength is 5/5 in all 4 extremities with giveaway weakness in the right upper extremity and lower extremity Integumental: Moist with no significant rashes or nodules Lymph: no cervical or supraclavicular lymphadenopathy   Assessment/Plan:   Hospital discharge follow-up  (primary encounter diagnosis) Cerebrovascular accident (CVA) due to embolism of left middle cerebral artery (CMS/HHS-HCC) Vitamin D deficiency Migraine without status migrainosus, not intractable, unspecified migraine type  Assessment & Plan   1.  Hospital discharge follow-up- -patient admitted to hospital on 06/07/2024 and discharged on 06/09/2024 secondary to left MCA infarct resulting in right-sided weakness and speech changes - Most of her symptoms have improved except minimal giveaway weakness of right upper and lower extremities. - Will benefit from outpatient physical therapy and Occupational Therapy. - tPA was not administered because the symptoms were very minimal and were already improving - She was advised to stop her Depo shots. - Continue aspirin  and Plavix . - All the records, imaging studies and discharge summaries from hospital have been reviewed  2.  Large patent foramen ovale- - Given her current stroke diagnosis, she will be seen by cardiology as scheduled as outpatient to discuss further treatment plan for this.  TEE was done while she was in the hospital.  3.  Hypercoagulability workup- - Showed low levels of protein C and protein S.  Patient was seen by hematology as outpatient who recommended repeat labs done in 3 months to confirm deficiency.   4.  Chronic microcytic anemia- - Secondary to thalassemia according to previous workup and confirmed by hematology.  Monitor at this time  5.  Agreed that I can fill in the FMLA paperwork for patient starting her hospitalization day from  06/07/2024 through 06/27/2024 given that she still  has some weakness and has follow-up with cardiology, neurology and may be needing a procedure for her PFO closure.  Also will need physical therapy and Occupational Therapy. Will also fill FMLA paperwork for her husband from 06/07/2024 through 06/09/2024 as she was hospitalized, he was attending to her.   Follow-up in 3 months for follow-up of stroke, anemia     Goals     . Follow my doctor's care plan        LAVENIA BEAVER, MD  Portions of this note were created using dictation software and may contain typographical errors.

## 2024-06-21 LAB — ALPHA-THALASSEMIA ANALYSIS: IMAGE: 0

## 2024-06-21 NOTE — Progress Notes (Unsigned)
 Cardiology Office Note:   Date:  06/27/2024  ID:  Sarah Holland, DOB 1991-09-21, MRN [id number] PCP:  Sherial Bail, MD  [facility] Providers Cardiologist:  Wendel Haws, MD Referring MD: Sherial Bail, MD  Chief Complaint/Reason for Referral: PFO and stroke ASSESSMENT:    1. Acute cerebrovascular accident (CVA) (HCC)   2. PFO (patent foramen ovale)   3. Alpha-thalassemia (HCC)     PLAN:   In order of problems listed above: Acute CVA: The patient's rope score is quite high.  Will obtain a 1-week monitor to evaluate for atrial fibrillation and if negative will refer for PFO closure.  Follow up in 3 weeks after neurology appointment.  She will probably be off Plavix  by the time I see her next.  Will plan on restarting Plavix  after PFO closure if this is pursued. PFO: See discussion above. Alpha thalassemia: Followed by hematology.            Dispo:  Return in about 3 weeks (around 07/18/2024).       Labs/tests ordered: Orders Placed This Encounter  Procedures   LONG TERM MONITOR (3-14 DAYS)   EKG 12-Lead    Current medicines are reviewed at length with the patient today.  The patient does not have concerns regarding medicines.  I spent 48 minutes reviewing all clinical data during and prior to this visit including all relevant imaging studies, laboratories, clinical information from other health systems and prior notes from both Cardiology and other specialties, interviewing the patient, conducting a complete physical examination, and coordinating care in order to formulate a comprehensive and personalized evaluation and treatment plan.   History of Present Illness:    FOCUSED PROBLEM LIST:   CVA >> dysarthria and right-sided weakness July 2025 CT no acute abnormality CTA head and neck left M3 occlusion MRI no acute abnormality TCD bubble study Spencer degree 3 with Valsalva 2D Echo EF 60 to 65%, evidence of large PFO TEE moderate size PFO with small  ASD with positive bubble study LE venous Doppler no DVT Rope score 9 Alpha thalassemia Followed by hematology oncology  July 2025:  Patient consents to use of AI scribe. The patient is a 33 year old female with the above listed medical problems referred for recommendations regarding PFO management in the context of a recent stroke.  The patient was in her normal state of health up until July 8 when she developed dysarthria and right-sided weakness while at work.  Her workup after presentation at [facility] as above.  The patient was not treated with lytics or interventional therapy due to complete resolution of symptoms.  She was ultimately discharged home on aspirin  and Plavix .  In the course of her workup a PFO was found and she is here to discuss further.  Since discharge, she has not experienced any recurrent stroke symptoms. However, she experiences palpitations approximately once a week, primarily when lying down to sleep, lasting only a few seconds. These episodes are sometimes accompanied by anxiety, sweating, and difficulty sleeping. She also experienced epistaxis on the morning of this visit, which took some time to stop but was not severe.  She has not returned to work since the event. She was initially scheduled to return to work the day after this visit.  She was seen by a hematologist, Dr. Lonn, and was tested for thalassemia, which returned positive, although she has not yet been contacted with the results. She feels lightheaded most days.     Current Medications: Current Meds  Medication Sig   aspirin  EC 81 MG tablet Take 1 tablet (81 mg total) by mouth daily. Swallow whole.   clopidogrel  (PLAVIX ) 75 MG tablet Take 1 tablet (75 mg total) by mouth daily.     Review of Systems:   Please see the history of present illness.    All other systems reviewed and are negative.     EKGs/Labs/Other Test Reviewed:   EKG: 2024 sinus tachycardia with with RAE and  nonspecific ST and T wave changes  EKG Interpretation Date/Time:  Monday June 27 2024 08:58:35 EDT Ventricular Rate:  82 PR Interval:  152 QRS Duration:  72 QT Interval:  342 QTC Calculation: 399 R Axis:   51  Text Interpretation: Normal sinus rhythm Normal ECG When compared with ECG of 23-Mar-2023 09:46, Vent. rate has decreased BY  48 BPM Nonspecific T wave abnormality, improved in Inferior leads Nonspecific T wave abnormality no longer evident in Anterolateral leads Confirmed by Wendel Haws ([id number]) on 06/27/2024 9:01:19 AM         Risk Assessment/Calculations:          Physical Exam:   VS:  BP 116/79   Pulse 89   Ht 5' 6 (1.676 m)   Wt 210 lb (95.3 kg)   SpO2 98%   BMI 33.89 kg/m        Wt Readings from Last 3 Encounters:  06/27/24 210 lb (95.3 kg)  06/13/24 208 lb 6.4 oz (94.5 kg)  06/07/24 213 lb 6.5 oz (96.8 kg)      GENERAL:  No apparent distress, AOx3 HEENT:  No carotid bruits, +2 carotid impulses, no scleral icterus CAR: RRR no murmurs, gallops, rubs, or thrills RES:  Clear to auscultation bilaterally ABD:  Soft, nontender, nondistended, positive bowel sounds x 4 VASC:  +2 radial pulses, +2 carotid pulses NEURO:  CN 2-12 grossly intact; motor and sensory grossly intact PSYCH:  No active depression or anxiety EXT:  No edema, ecchymosis, or cyanosis  Signed, Haws MARLA Wendel, MD  06/27/2024 10:08 AM    [facility] [address] [place], [place], [place]  [address] Phone: [phone]; Fax: [phone]   Note:  This document was prepared using Dragon voice recognition software and may include unintentional dictation errors.

## 2024-06-27 ENCOUNTER — Encounter: Payer: Self-pay | Admitting: Internal Medicine

## 2024-06-27 ENCOUNTER — Ambulatory Visit: Attending: Internal Medicine | Admitting: Internal Medicine

## 2024-06-27 ENCOUNTER — Ambulatory Visit

## 2024-06-27 ENCOUNTER — Ambulatory Visit: Payer: Self-pay | Admitting: Hematology and Oncology

## 2024-06-27 VITALS — BP 116/79 | HR 89 | Ht 66.0 in | Wt 210.0 lb

## 2024-06-27 DIAGNOSIS — Q2112 Patent foramen ovale: Secondary | ICD-10-CM

## 2024-06-27 DIAGNOSIS — D560 Alpha thalassemia: Secondary | ICD-10-CM | POA: Diagnosis not present

## 2024-06-27 DIAGNOSIS — I639 Cerebral infarction, unspecified: Secondary | ICD-10-CM

## 2024-06-27 NOTE — Progress Notes (Unsigned)
 ZIO serial # [id number] from office inventory applied to patient.

## 2024-06-27 NOTE — Telephone Encounter (Signed)
 Spoke with patient and provided following message regarding recent lab results.  Patient verbalized an understanding of the information. No further questions at this time.

## 2024-06-27 NOTE — Telephone Encounter (Signed)
-----   Message from Almarie Bedford sent at 06/27/2024  8:21 AM EDT ----- Pls call her, test results confirmed thalassemia. Nothing for her to do ----- Message ----- From: Interface, Lab In [facility] Sent: 06/13/2024   2:04 PM EDT To: Almarie Bedford, MD

## 2024-06-27 NOTE — Patient Instructions (Addendum)
 Medication Instructions:  No changes *If you need a refill on your cardiac medications before your next appointment, please call your pharmacy*  Lab Work: none  Testing/Procedures: 7 Day Zio Heart Monitor - see instructions below.  Follow-Up: 3 weeks w Dr. Wendel August 20, 215 at 11:20 am   ZIO XT- Long Term Monitor Instructions  Your physician has requested you wear a ZIO patch monitor for 7 days.  This is a single patch monitor. [employer] supplies one patch monitor per enrollment. Additional stickers are not available. Please do not apply patch if you will be having a Nuclear Stress Test,  Echocardiogram, Cardiac CT, MRI, or Chest Xray during the period you would be wearing the  monitor. The patch cannot be worn during these tests. You cannot remove and re-apply the  ZIO XT patch monitor.  Your ZIO patch monitor will be mailed 3 day USPS to your address on file. It may take 3-5 days  to receive your monitor after you have been enrolled.  Once you have received your monitor, please review the enclosed instructions. Your monitor  has already been registered assigning a specific monitor serial # to you.  Billing and Patient Assistance Program Information  We have supplied [employer] with any of your insurance information on file for billing purposes. [employer] offers a sliding scale Patient Assistance Program for patients that do not have  insurance, or whose insurance does not completely cover the cost of the ZIO monitor.  You must apply for the Patient Assistance Program to qualify for this discounted rate.  To apply, please call [employer] at [phone], select option 4, select option 2, ask to apply for  Patient Assistance Program. Meredeth will ask your household income, and how many people  are in your household. They will quote your out-of-pocket cost based on that information.  [employer] will also be able to set up a 11-month, interest-free payment plan if needed.  Applying the  monitor  Hold abrader disc by orange tab. Rub abrader in 40 strokes over the upper left chest as  indicated in your monitor instructions.  Clean area with 4 enclosed alcohol pads. Let dry.  Apply patch as indicated in monitor instructions. Patch will be placed under collarbone on left  side of chest with arrow pointing upward.  Rub patch adhesive wings for 2 minutes. Remove white label marked 1. Remove the white  label marked 2. Rub patch adhesive wings for 2 additional minutes.  While looking in a mirror, press and release button in center of patch. A small green light will  flash 3-4 times. This will be your only indicator that the monitor has been turned on.  Do not shower for the first 24 hours. You may shower after the first 24 hours.  Press the button if you feel a symptom. You will hear a small click. Record Date, Time and  Symptom in the Patient Logbook.  When you are ready to remove the patch, follow instructions on the last 2 pages of Patient  Logbook. Stick patch monitor onto the last page of Patient Logbook.  Place Patient Logbook in the blue and white box. Use locking tab on box and tape box closed  securely. The blue and white box has prepaid postage on it. Please place it in the mailbox as  soon as possible. Your physician should have your test results approximately 7 days after the  monitor has been mailed back to [facility].  Call [facility] Customer Care at [phone] if  you have questions regarding  your ZIO XT patch monitor. Call them immediately if you see an orange light blinking on your  monitor.  If your monitor falls off in less than 4 days, contact our Monitor department at [phone].  If your monitor becomes loose or falls off after 4 days call [employer] at [phone] for  suggestions on securing your monitor

## 2024-07-08 NOTE — Progress Notes (Deleted)
 Cardiology Office Note:   Date:  07/08/2024  ID:  RENA HUNKE, DOB 1991-11-19, MRN [id number] PCP:  Sherial Bail, MD  [facility] Providers Cardiologist:  Wendel Haws, MD Referring MD: Sherial Bail, MD  Chief Complaint/Reason for Referral: PFO and stroke ASSESSMENT:    1. Acute cerebrovascular accident (CVA) (HCC)   2. PFO (patent foramen ovale)   3. Alpha-thalassemia (HCC)      PLAN:   In order of problems listed above: Acute CVA: *** PFO: See discussion above. Alpha thalassemia: Followed by hematology.            Dispo:  No follow-ups on file.       Labs/tests ordered: No orders of the defined types were placed in this encounter.   Current medicines are reviewed at length with the patient today.  The patient does not have concerns regarding medicines.  I spent 48 minutes reviewing all clinical data during and prior to this visit including all relevant imaging studies, laboratories, clinical information from other health systems and prior notes from both Cardiology and other specialties, interviewing the patient, conducting a complete physical examination, and coordinating care in order to formulate a comprehensive and personalized evaluation and treatment plan.   History of Present Illness:    FOCUSED PROBLEM LIST:   CVA >> dysarthria and right-sided weakness July 2025 CT no acute abnormality CTA head and neck left M3 occlusion MRI no acute abnormality TCD bubble study Spencer degree 3 with Valsalva 2D Echo EF 60 to 65%, evidence of large PFO TEE moderate size PFO with small ASD with positive bubble study LE venous Doppler no DVT Rope score 9 Alpha thalassemia Followed by hematology oncology  July 2025:  Patient consents to use of AI scribe. The patient is a 33 year old female with the above listed medical problems referred for recommendations regarding PFO management in the context of a recent stroke.  The patient was in her normal  state of health up until July 8 when she developed dysarthria and right-sided weakness while at work.  Her workup after presentation at [facility] as above.  The patient was not treated with lytics or interventional therapy due to complete resolution of symptoms.  She was ultimately discharged home on aspirin  and Plavix .  In the course of her workup a PFO was found and she is here to discuss further.  Since discharge, she has not experienced any recurrent stroke symptoms. However, she experiences palpitations approximately once a week, primarily when lying down to sleep, lasting only a few seconds. These episodes are sometimes accompanied by anxiety, sweating, and difficulty sleeping. She also experienced epistaxis on the morning of this visit, which took some time to stop but was not severe.  She has not returned to work since the event. She was initially scheduled to return to work the day after this visit.  She was seen by a hematologist, Dr. Lonn, and was tested for thalassemia, which returned positive, although she has not yet been contacted with the results. She feels lightheaded most days.  Plan: Refer for 1 week monitor and follow-up in 3 weeks.  August 2025:  Patient consents to use of AI scribe. In the interim the patient's monitor demonstrated ***     Current Medications: No outpatient medications have been marked as taking for the 07/20/24 encounter (Appointment) with Sally-Anne Wamble K, MD.     Review of Systems:   Please see the history of present illness.    All other systems reviewed  and are negative.     EKGs/Labs/Other Test Reviewed:   EKG: 2024 sinus tachycardia with with RAE and nonspecific ST and T wave changes  EKG Interpretation Date/Time:    Ventricular Rate:    PR Interval:    QRS Duration:    QT Interval:    QTC Calculation:   R Axis:      Text Interpretation:           Risk Assessment/Calculations:          Physical Exam:   VS:  There  were no vitals taken for this visit.   No BP recorded.  {Refresh Note OR Click here to enter BP  :1}***   Wt Readings from Last 3 Encounters:  06/27/24 210 lb (95.3 kg)  06/13/24 208 lb 6.4 oz (94.5 kg)  06/07/24 213 lb 6.5 oz (96.8 kg)      GENERAL:  No apparent distress, AOx3 HEENT:  No carotid bruits, +2 carotid impulses, no scleral icterus CAR: RRR no murmurs, gallops, rubs, or thrills RES:  Clear to auscultation bilaterally ABD:  Soft, nontender, nondistended, positive bowel sounds x 4 VASC:  +2 radial pulses, +2 carotid pulses NEURO:  CN 2-12 grossly intact; motor and sensory grossly intact PSYCH:  No active depression or anxiety EXT:  No edema, ecchymosis, or cyanosis  Signed, Kahner Yanik K Selim Durden, MD  07/08/2024 4:58 PM    [facility] [address] [place], [place], [place]  [address] Phone: [phone]; Fax: [phone]   Note:  This document was prepared using Dragon voice recognition software and may include unintentional dictation errors.

## 2024-07-12 ENCOUNTER — Other Ambulatory Visit (HOSPITAL_COMMUNITY): Payer: Self-pay

## 2024-07-12 NOTE — Progress Notes (Signed)
 [facility] [address] [place]. [place] [address] [phone]       HOSPITAL FOLLOW UP NOTE  [name] Date of Birth:  [birth year] Medical Record Number:  [id number]   Reason for Referral:  hospital stroke follow up    SUBJECTIVE:   CHIEF COMPLAINT:  Chief Complaint  Patient presents with   New Patient (Initial Visit)    RM 8, Pt alone. Pt here for initial stroke f/u. Reports having lightheadedness on some days and sometimes balance issues.    HPI:   [name] is a 33 y.o. female with history of migraine, gestational diabetes and hypertension who presented to [facility] ED on 06/07/2024 with right-sided weakness and slurred speech with headache developed after.  CT head and MRI brain no acute abnormality.  CTA head/neck showed left M3 occlusion.  TCD bubble study Spencer degree 3 with Valsalva.  2D echo EF 60 to 65% with large PFO.  TEE large PFO tunneled, very positive bubble study especially with Valsalva.  LE Doppler negative for DVT.  LDL 77.  A1c 5.5.  Hypercoag workup showed low level of protein C and S.  Suspected small left MCA stroke not detected on MRI, embolic pattern, likely related to PFO.  She was placed on DAPT and she was referred to cardiology for PFO closure.  Advised follow-up outpatient with hematology for protein C and S deficiency.   Today, 07/13/2024, patient is being seen for initial hospital follow-up unaccompanied.  Reports overall doing well since discharge.  She does report occasional episodes of lightheadedness and occasionally feeling off balance but has been gradually improving.  She denies any residual weakness or speech difficulty.  She has been experiencing more frequent migraine headaches, last week had migraine that lasted for 3 days.  She was previously on sumatriptan  for rescue but was advised recently to not take.  She is not currently on any preventative treatment as migraines previously occurred rarely.  No new  stroke/TIA symptoms.  She remains on both aspirin  and Plavix  without side effects.  Evaluated by cardiology, advised to complete 1 week cardiac monitor to evaluate for A-fib and if negative, she will be referred for PFO closure. Completed cardiac monitor, has f/u visit with cards next week to review. She was advised to discuss ongoing need of Plavix  during todays visit.  She routinely follows with PCP.  She was seen by hematology and diagnosed with alpha thalassemia, she has f/u visit in October.      PERTINENT IMAGING  CT no acute abnormality CTA head and neck left M3 occlusion MRI no acute abnormality TCD bubble study Spencer degree 3 with Valsalva 2D Echo EF 60 to 65%, evidence of large PFO TEE large PFO tunneled, very positive bubble study especially with valsalva LE venous Doppler no DVT LDL 77 HgbA1c 5.5 UDS negative Hypercoag workup low level of protein C and S    ROS:   14 system review of systems performed and negative with exception of those listed in HPI  PMH:  Past Medical History:  Diagnosis Date   Elevated blood pressure complicating pregnancy in third trimester, antepartum 09/27/2019   Encounter for induction of labor 09/28/2019   Family history of pancreatic cancer    8/23 cancer genetic testing letter sent   Gestational diabetes    Gestational hypertension 09/28/2019   Obesity (BMI 30-39.9)    Obesity complicating peripregnancy, antepartum 08/09/2019   Stroke (HCC) 06/07/2024   Supervision of high risk pregnancy, antepartum  04/05/2019   Clinic [facility] Prenatal Labs Dating LMP Blood type: B/Positive/-- (04/22 1503)  Genetic Screen NIPS: Negative XY Antibody:Negative (04/22 1503) Anatomic US  Complete 05/16/2019 Rubella: 3.96 (04/22 1503) Varicella: Immune GTT Early:               Third trimester: 204; 3 hr 98/219/204/140- RPR: Non Reactive (04/22 1503)  Rhogam N/A HBsAg: Negative (04/22 1503)  TDaP vaccine        08/02/19                PSH:  Past Surgical  History:  Procedure Laterality Date   APPENDECTOMY     TRANSESOPHAGEAL ECHOCARDIOGRAM (CATH LAB) N/A 06/09/2024   Procedure: TRANSESOPHAGEAL ECHOCARDIOGRAM;  Surgeon: Delford Maude BROCKS, MD;  Location: [facility] INVASIVE CV LAB;  Service: Cardiovascular;  Laterality: N/A;    Social History:  Social History   Socioeconomic History   Marital status: Married    Spouse name: Not on file   Number of children: 3   Years of education: Not on file   Highest education level: Not on file  Occupational History   Occupation: [employer]  Tobacco Use   Smoking status: Former    Current packs/day: 0.00    Average packs/day: 0.3 packs/day for 3.0 years (0.8 ttl pk-yrs)    Types: Cigarettes    Start date: 12/01/2014    Quit date: 12/01/2017    Years since quitting: 6.6   Smokeless tobacco: Never  Vaping Use   Vaping status: Every Day   Substances: Nicotine  Substance and Sexual Activity   Alcohol use: No   Drug use: Never   Sexual activity: Yes    Birth control/protection: Injection  Other Topics Concern   Not on file  Social History Narrative   Not on file   Social Drivers of Health   Financial Resource Strain: Low Risk  (03/24/2024)   Received from [facility]   Overall Financial Resource Strain (CARDIA)    Difficulty of Paying Living Expenses: Not hard at all  Food Insecurity: No Food Insecurity (06/07/2024)   Hunger Vital Sign    Worried About Running Out of Food in the Last Year: Never true    Ran Out of Food in the Last Year: Never true  Transportation Needs: No Transportation Needs (06/07/2024)   PRAPARE - Administrator, Civil Service (Medical): No    Lack of Transportation (Non-Medical): No  Physical Activity: Not on file  Stress: Not on file  Social Connections: Not on file  Intimate Partner Violence: Not At Risk (06/07/2024)   Humiliation, Afraid, Rape, and Kick questionnaire    Fear of Current or Ex-Partner: No    Emotionally Abused: No    Physically Abused:  No    Sexually Abused: No    Family History:  Family History  Problem Relation Age of Onset   Liver cancer Father 38   Diabetes Maternal Grandmother    Pancreatic cancer Maternal Grandmother     Medications:   Current Outpatient Medications on File Prior to Visit  Medication Sig Dispense Refill   aspirin  EC 81 MG tablet Take 1 tablet (81 mg total) by mouth daily. Swallow whole. 30 tablet 1   clopidogrel  (PLAVIX ) 75 MG tablet Take 1 tablet (75 mg total) by mouth daily. 30 tablet 1   No current facility-administered medications on file prior to visit.    Allergies:  No Known Allergies    OBJECTIVE:  Physical Exam  Vitals:   07/13/24  0906  BP: (!) 138/96  Pulse: 82  Weight: 214 lb 9.6 oz (97.3 kg)  Height: 5' 6 (1.676 m)   Body mass index is 34.64 kg/m. No results found.  General: well developed, well nourished, pleasant middle-age female, seated, in no evident distress  Neurologic Exam Mental Status: Awake and fully alert.  Fluent speech and language.  Oriented to place and time. Recent and remote memory intact. Attention span, concentration and fund of knowledge appropriate. Mood and affect appropriate.  Cranial Nerves: Fundoscopic exam reveals sharp disc margins. Pupils equal, briskly reactive to light. Extraocular movements full without nystagmus. Visual fields full to confrontation. Hearing intact. Facial sensation intact. Face, tongue, palate moves normally and symmetrically.  Motor: Normal bulk and tone. Normal strength in all tested extremity muscles Sensory.: intact to touch , pinprick , position and vibratory sensation.  Coordination: Rapid alternating movements normal in all extremities. Finger-to-nose and heel-to-shin performed accurately bilaterally. Gait and Station: Arises from chair without difficulty.  Able to stand from seated position with arms crossed.  Stance is normal. Gait demonstrates normal stride length and mild imbalance more so with RLE.   Tandem walk and heel toe with mild difficulty.  Reflexes: 1+ and symmetric. Toes downgoing.     NIHSS  0 Modified Rankin  1-2      ASSESSMENT: Sarah Holland is a 33 y.o. year old female with likely small left MCA stroke not detected on MRI on 06/07/2024, embolic pattern, likely related to large PFO. Vascular risk factors include large PFO, migraine headaches, gestational DM, protein C and S deficiency and recent dx of thalassemia and obesity.      PLAN:  Suspected left MCA stroke:  Residual deficit: Mild gait impairment with imbalance. Discussed typical recovery time.  If symptoms do not continue to improve, consider physical therapy evaluation. Continue aspirin  81mg  daily for secondary stroke prevention managed/prescribed by PCP.  Advised to discontinue Plavix  as 3 weeks DAPT completed.  No indication for statin currently given childbearing age and LDL close to goal Discussed secondary stroke prevention measures and importance of close PCP follow up for aggressive stroke risk factor management including BP goal<130/90, HLD with LDL goal<70 and DM with A1c.<7 .  Stroke labs 05/2024: LDL 77, A1c 5.5 I have gone over the pathophysiology of stroke, warning signs and symptoms, risk factors and their management in some detail with instructions to go to the closest emergency room for symptoms of concern.  Migraine headache: Worsening post stroke Recommend trial of Nurtec to use as needed, max 1 tab/24hrs. She was provided sample. She will call if beneficial for official prescription. If no benefit, can try Ubrelvy.  Triptans contraindicated due to recent stroke Consider prophylactic therapy if migraines persist, discussed possible improvement if undergoes PFO closure  PFO: ROPE score 8, 84% change of relationship to stroke Per cardiology, complete cardiac monitor and if negative, will refer for PFO closure She is aware Plavix  will be restarted if she undergoes PFO closure  Thalassemia:   Recently diagnosed by hematology, f/u with hematology in October    Follow up in 4 months or call earlier if needed   CC:  [facility] provider: Dr. Rosemarie PCP: Sherial Bail, MD    I personally spent a total of 65 minutes in the care of the patient today including preparing to see the patient, performing a medically appropriate exam/evaluation, counseling and educating, placing orders, and documenting clinical information in the EHR.  Time also spent extensively reviewing recent hospitalization and review  of specialty notes.  Harlene Bogaert, AGNP-BC  [facility] [address] [place], [place] [address]  Phone [phone] Fax [phone] Note: This document was prepared with digital dictation and possible smart phrase technology. Any transcriptional errors that result from this process are unintentional.

## 2024-07-13 ENCOUNTER — Ambulatory Visit (INDEPENDENT_AMBULATORY_CARE_PROVIDER_SITE_OTHER): Admitting: Adult Health

## 2024-07-13 ENCOUNTER — Encounter: Payer: Self-pay | Admitting: Adult Health

## 2024-07-13 VITALS — BP 138/96 | HR 82 | Ht 66.0 in | Wt 214.6 lb

## 2024-07-13 DIAGNOSIS — G43911 Migraine, unspecified, intractable, with status migrainosus: Secondary | ICD-10-CM

## 2024-07-13 DIAGNOSIS — I63412 Cerebral infarction due to embolism of left middle cerebral artery: Secondary | ICD-10-CM

## 2024-07-13 DIAGNOSIS — Q2112 Patent foramen ovale: Secondary | ICD-10-CM

## 2024-07-13 MED ORDER — NURTEC 75 MG PO TBDP: 75.0000 mg | ORAL_TABLET | ORAL | Status: DC | PRN

## 2024-07-13 NOTE — Patient Instructions (Addendum)
 Continue aspirin  81 mg daily and atorvastatin  for secondary stroke prevention Okay to stop Plavix  at this time   Follow up with cardiology next week and possibly pursue PFO closure  Try Nurtec to take at onset of migraine headache - do not take more than 1 in a 24 hour period - if this is helpful, please let me know for an official prescription. If it doesn't help, please let me know this as well and we can try something different   Signs of a Stroke? Follow the BEFAST method:  Balance Watch for a sudden loss of balance, trouble with coordination or vertigo Eyes Is there a sudden loss of vision in one or both eyes? Or double vision?  Face: Ask the person to smile. Does one side of the face droop or is it numb?  Arms: Ask the person to raise both arms. Does one arm drift downward? Is there weakness or numbness of a leg? Speech: Ask the person to repeat a simple phrase. Does the speech sound slurred/strange? Is the person confused ? Time: If you observe any of these signs, call 911.      Followup in the future with me in 4 months or call earlier if needed       Thank you for coming to see us  at [facility]. I hope we have been able to provide you high quality care today.  You may receive a patient satisfaction survey over the next few weeks. We would appreciate your feedback and comments so that we may continue to improve ourselves and the health of our patients.

## 2024-07-15 NOTE — Progress Notes (Signed)
 I agree with the above plan

## 2024-07-18 NOTE — Progress Notes (Unsigned)
 PCP:  Sherial Bail, MD   No chief complaint on file.    HPI:      Sarah Holland is a 33 y.o. [id number] whose LMP was No LMP recorded. Patient has had an injection., presents today for her annual examination.  Her menses are absent on depo. No BTB, mild dysmen occas  Sex activity: single partner, contraception - Depo-Provera  injections.  Last Pap: 05/25/23 Results were NILM/neg HPV DNA, repeat due after 3 yrs.  05/01/22 Results were: no abnormalities /neg HPV DNA  5/22 Colpo bx with CIN 1 with Dr. Kate  03/07/21 ASC-H/neg HPV DNA Hx of STDs: HPV  There is a FH of breast cancer in her mat grt aunt, genetic testing not indicated. There is no FH of ovarian cancer. The patient does self-breast exams.  Tobacco use: vapes daily, quit cigs Alcohol use: none No drug use.  Exercise: moderately active  She does not get adequate calcium and Vitamin D in her diet.  Pt developed severe LBP with nausea/fever/dizziness 4/24. No urin sx. Went to ED and diagnosed with pyelonephritis; C&S showed klebsiella. Treated inpatient for a few days. Neg CT for stones. Sx resolved. Pt developed LBP again last wk and C&S showed Klebsiella UTI again, no other urin sx. Has to miss work due to severe LBP. Treated and sx resolved. Pt concerned about new frequent UTIs. Not prone to UTIs. Sex not a trigger. Drinks a lot of water daily.    Patient Active Problem List   Diagnosis Date Noted   Microcytic anemia 06/13/2024   Vaping nicotine dependence, 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   PFO (patent foramen ovale) 06/13/2024   Acute cerebrovascular accident (CVA) (HCC) 06/07/2024   Migraines 06/07/2024   Dysplasia of cervix, low grade (CIN 1) 05/21/2023   Pyelonephritis 03/24/2023   Acute pyelonephritis 03/23/2023   AKI (acute kidney injury) (HCC) 03/23/2023   Back pain 03/23/2023   Obesity (BMI 30-39.9) 08/30/2020   Normal vaginal delivery 09/28/2019   History of gestational diabetes 08/02/2019   SIRS  (systemic inflammatory response syndrome) (HCC) 05/10/2015    Past Surgical History:  Procedure Laterality Date   APPENDECTOMY     TRANSESOPHAGEAL ECHOCARDIOGRAM (CATH LAB) N/A 06/09/2024   Procedure: TRANSESOPHAGEAL ECHOCARDIOGRAM;  Surgeon: Delford Maude BROCKS, MD;  Location: [facility] INVASIVE CV [facility];  Service: Cardiovascular;  Laterality: N/A;    Family History  Problem Relation Age of Onset   Liver cancer Father 29   Diabetes Maternal Grandmother    Pancreatic cancer Maternal Grandmother     Social History   Socioeconomic History   Marital status: Married    Spouse name: Not on file   Number of children: 3   Years of education: Not on file   Highest education level: Not on file  Occupational History   Occupation: [employer]  Tobacco Use   Smoking status: Former    Current packs/day: 0.00    Average packs/day: 0.3 packs/day for 3.0 years (0.8 ttl pk-yrs)    Types: Cigarettes    Start date: 12/01/2014    Quit date: 12/01/2017    Years since quitting: 6.6   Smokeless tobacco: Never  Vaping Use   Vaping status: Every Day   Substances: Nicotine  Substance and Sexual Activity   Alcohol use: No   Drug use: Never   Sexual activity: Yes    Birth control/protection: Injection  Other Topics Concern   Not on file  Social History Narrative   Not on file   Social Drivers  of Health   Financial Resource Strain: Low Risk  (03/24/2024)   Received from [facility]   Overall Financial Resource Strain (CARDIA)    Difficulty of Paying Living Expenses: Not hard at all  Food Insecurity: No Food Insecurity (06/07/2024)   Hunger Vital Sign    Worried About Running Out of Food in the Last Year: Never true    Ran Out of Food in the Last Year: Never true  Transportation Needs: No Transportation Needs (06/07/2024)   PRAPARE - Administrator, Civil Service (Medical): No    Lack of Transportation (Non-Medical): No  Physical Activity: Not on file  Stress: Not on file  Social  Connections: Not on file  Intimate Partner Violence: Not At Risk (06/07/2024)   Humiliation, Afraid, Rape, and Kick questionnaire    Fear of Current or Ex-Partner: No    Emotionally Abused: No    Physically Abused: No    Sexually Abused: No     Current Outpatient Medications:    aspirin  EC 81 MG tablet, Take 1 tablet (81 mg total) by mouth daily. Swallow whole., Disp: 30 tablet, Rfl: 1   Rimegepant Sulfate (NURTEC) 75 MG TBDP, Take 1 tablet (75 mg total) by mouth as needed., Disp: , Rfl:      ROS:  Review of Systems  Constitutional:  Negative for fatigue, fever and unexpected weight change.  Respiratory:  Negative for cough, shortness of breath and wheezing.   Cardiovascular:  Negative for chest pain, palpitations and leg swelling.  Gastrointestinal:  Negative for blood in stool, constipation, diarrhea, nausea and vomiting.  Endocrine: Negative for cold intolerance, heat intolerance and polyuria.  Genitourinary:  Positive for frequency. Negative for dyspareunia, dysuria, flank pain, genital sores, hematuria, menstrual problem, pelvic pain, urgency, vaginal bleeding, vaginal discharge and vaginal pain.  Musculoskeletal:  Negative for back pain, joint swelling and myalgias.  Skin:  Negative for rash.  Neurological:  Negative for dizziness, syncope, light-headedness, numbness and headaches.  Hematological:  Negative for adenopathy.  Psychiatric/Behavioral:  Negative for agitation, confusion, sleep disturbance and suicidal ideas. The patient is not nervous/anxious.    BREAST: No symptoms   Objective: There were no vitals taken for this visit.   Physical Exam Constitutional:      Appearance: She is well-developed.  Genitourinary:     Vulva normal.     Right Labia: No rash, tenderness or lesions.    Left Labia: No tenderness, lesions or rash.    No vaginal discharge, erythema or tenderness.      Right Adnexa: not tender and no mass present.    Left Adnexa: not tender and no  mass present.    No cervical friability or polyp.     Uterus is not enlarged or tender.  Breasts:    Right: No mass, nipple discharge, skin change or tenderness.     Left: No mass, nipple discharge, skin change or tenderness.  Neck:     Thyroid: No thyromegaly.  Cardiovascular:     Rate and Rhythm: Normal rate and regular rhythm.     Heart sounds: Normal heart sounds. No murmur heard. Pulmonary:     Effort: Pulmonary effort is normal.     Breath sounds: Normal breath sounds.  Abdominal:     Palpations: Abdomen is soft.     Tenderness: There is no abdominal tenderness. There is no guarding or rebound.  Musculoskeletal:        General: Normal range of motion.  Cervical back: Normal range of motion.  Lymphadenopathy:     Cervical: No cervical adenopathy.  Neurological:     General: No focal deficit present.     Mental Status: She is alert and oriented to person, place, and time.     Cranial Nerves: No cranial nerve deficit.  Skin:    General: Skin is warm and dry.  Psychiatric:        Mood and Affect: Mood normal.        Behavior: Behavior normal.        Thought Content: Thought content normal.        Judgment: Judgment normal.  Vitals reviewed.     Assessment/Plan: Encounter for annual routine gynecological examination  Cervical cancer screening - Plan: Cytology - PAP  Screening for HPV (human papillomavirus) - Plan: Cytology - PAP  Dysplasia of cervix, low grade (CIN 1) - Plan: Cytology - PAP; repeat today, will f/u with results.   Encounter for surveillance of injectable contraceptive - Plan: medroxyPROGESTERone  (DEPO-PROVERA ) injection 150 mg; Rx RF. Add ca/Vit D.   Recurrent UTI - Plan: Ambulatory referral to Urology; refer to urology given new onset UTIs with pyelonephritis 4/24.    No orders of the defined types were placed in this encounter.            GYN counsel adequate intake of calcium and vitamin D, diet and exercise     F/U  No follow-ups on  file.  Geanie Pacifico B. Taelar Gronewold, PA-C 07/18/2024 5:35 PM

## 2024-07-19 ENCOUNTER — Ambulatory Visit (INDEPENDENT_AMBULATORY_CARE_PROVIDER_SITE_OTHER): Admitting: Obstetrics and Gynecology

## 2024-07-19 ENCOUNTER — Encounter: Payer: Self-pay | Admitting: Obstetrics and Gynecology

## 2024-07-19 VITALS — BP 112/74 | HR 106 | Ht 66.0 in | Wt 210.0 lb

## 2024-07-19 DIAGNOSIS — Z3009 Encounter for other general counseling and advice on contraception: Secondary | ICD-10-CM

## 2024-07-19 DIAGNOSIS — Z01419 Encounter for gynecological examination (general) (routine) without abnormal findings: Secondary | ICD-10-CM | POA: Diagnosis not present

## 2024-07-19 DIAGNOSIS — B356 Tinea cruris: Secondary | ICD-10-CM

## 2024-07-19 DIAGNOSIS — Z3042 Encounter for surveillance of injectable contraceptive: Secondary | ICD-10-CM

## 2024-07-19 MED ORDER — KETOCONAZOLE 2 % EX CREA
1.0000 | TOPICAL_CREAM | Freq: Every day | CUTANEOUS | 0 refills | Status: DC
Start: 2024-07-19 — End: 2024-08-04

## 2024-07-19 NOTE — Patient Instructions (Signed)
 I value your feedback and you entrusting [place] with your care. If you get a [place] patient survey, I would appreciate you taking the time to let us know about your experience today. Thank you! ? ? ?

## 2024-07-20 ENCOUNTER — Ambulatory Visit: Admitting: Internal Medicine

## 2024-07-20 DIAGNOSIS — D560 Alpha thalassemia: Secondary | ICD-10-CM

## 2024-07-20 DIAGNOSIS — I639 Cerebral infarction, unspecified: Secondary | ICD-10-CM

## 2024-07-20 DIAGNOSIS — Q2112 Patent foramen ovale: Secondary | ICD-10-CM

## 2024-07-21 ENCOUNTER — Ambulatory Visit: Payer: Self-pay | Admitting: Internal Medicine

## 2024-07-21 DIAGNOSIS — Q2112 Patent foramen ovale: Secondary | ICD-10-CM

## 2024-07-21 DIAGNOSIS — I639 Cerebral infarction, unspecified: Secondary | ICD-10-CM

## 2024-07-23 NOTE — Progress Notes (Signed)
 Cardiology Office Note:   Date:  08/04/2024  ID:  Sarah Holland, DOB 02/11/1991, MRN [id number] PCP:  Sherial Bail, MD  [facility] Providers Cardiologist:  Wendel Haws, MD Referring MD: Sherial Bail, MD  Chief Complaint/Reason for Referral: PFO and stroke ASSESSMENT:    1. Acute cerebrovascular accident (CVA) (HCC)   2. PFO (patent foramen ovale)   3. Alpha-thalassemia (HCC)   4. Pre-procedure lab exam       PLAN:   In order of problems listed above: Acute CVA: Patient has had a complete evaluation for cryptogenic stroke.  Given her elevated rope score we will refer the patient for elective PFO closure to reduce the patient's risk for recurrent stroke.  Informed her that she will need to be on dual antiplatelet therapy after the procedure for 6 months.  She will need antibiotics for any dental work including cleanings for 6 months as well.  We will schedule her for follow-up 1 month after the procedure. PFO: See discussion above. Alpha thalassemia: Followed by hematology.            Dispo:  Return in about 5 weeks (around 09/08/2024).       Labs/tests ordered: Orders Placed This Encounter  Procedures   CBC   Basic metabolic panel with GFR   EKG 87-Ozji    Current medicines are reviewed at length with the patient today.  The patient does not have concerns regarding medicines.  I spent 33 minutes reviewing all clinical data during and prior to this visit including all relevant imaging studies, laboratories, clinical information from other health systems and prior notes from both Cardiology and other specialties, interviewing the patient, conducting a complete physical examination, and coordinating care in order to formulate a comprehensive and personalized evaluation and treatment plan.   History of Present Illness:    FOCUSED PROBLEM LIST:   CVA >> dysarthria and right-sided weakness July 2025 CT no acute abnormality CTA head and neck left M3  occlusion MRI no acute abnormality TCD bubble study Spencer degree 3 with Valsalva 2D Echo EF 60 to 65%, evidence of large PFO TEE moderate size PFO with small ASD with positive bubble study LE venous Doppler no DVT 30-day monitor no atrial fibrillation Rope score 9 Alpha thalassemia Followed by hematology oncology  July 2025:  Patient consents to use of AI scribe. The patient is a 33 year old female with the above listed medical problems referred for recommendations regarding PFO management in the context of a recent stroke.  The patient was in her normal state of health up until July 8 when she developed dysarthria and right-sided weakness while at work.  Her workup after presentation at [facility] as above.  The patient was not treated with lytics or interventional therapy due to complete resolution of symptoms.  She was ultimately discharged home on aspirin  and Plavix .  In the course of her workup a PFO was found and she is here to discuss further.  Since discharge, she has not experienced any recurrent stroke symptoms. However, she experiences palpitations approximately once a week, primarily when lying down to sleep, lasting only a few seconds. These episodes are sometimes accompanied by anxiety, sweating, and difficulty sleeping. She also experienced epistaxis on the morning of this visit, which took some time to stop but was not severe.  She has not returned to work since the event. She was initially scheduled to return to work the day after this visit.  She was seen by a hematologist,  Dr. Lonn, and was tested for thalassemia, which returned positive, although she has not yet been contacted with the results. She feels lightheaded most days.  Plan: Refer for 1 week monitor and follow-up in 3 weeks.  August 2025:  Patient consents to use of AI scribe. In the interim the patient's monitor demonstrated no atrial fibrillation.  She is here to discuss further management.  She has  not experienced any signs or symptoms of another stroke since her last visit. She is off Plavix  and has not experienced any significant bleeding while on aspirin . No swelling in her legs or other concerning symptoms have been noted.     Current Medications: Current Meds  Medication Sig   aspirin  EC 81 MG tablet Take 1 tablet (81 mg total) by mouth daily. Swallow whole.     Review of Systems:   Please see the history of present illness.    All other systems reviewed and are negative.     EKGs/Labs/Other Test Reviewed:   EKG: 2025 normal sinus rhythm  EKG Interpretation Date/Time:  Thursday August 04 2024 11:19:10 EDT Ventricular Rate:  92 PR Interval:  152 QRS Duration:  70 QT Interval:  324 QTC Calculation: 400 R Axis:   57  Text Interpretation: Normal sinus rhythm Normal ECG When compared with ECG of 27-Jun-2024 08:58, No significant change was found Confirmed by Wendel Haws ([id number]) on 08/04/2024 11:50:32 AM         Risk Assessment/Calculations:          Physical Exam:   VS:  BP 126/82   Pulse 84   Ht 5' 6 (1.676 m)   Wt 203 lb 6.4 oz (92.3 kg)   SpO2 97%   BMI 32.83 kg/m        Wt Readings from Last 3 Encounters:  08/04/24 203 lb 6.4 oz (92.3 kg)  07/19/24 210 lb (95.3 kg)  07/13/24 214 lb 9.6 oz (97.3 kg)      GENERAL:  No apparent distress, AOx3 HEENT:  No carotid bruits, +2 carotid impulses, no scleral icterus CAR: RRR no murmurs, gallops, rubs, or thrills RES:  Clear to auscultation bilaterally ABD:  Soft, nontender, nondistended, positive bowel sounds x 4 VASC:  +2 radial pulses, +2 carotid pulses NEURO:  CN 2-12 grossly intact; motor and sensory grossly intact PSYCH:  No active depression or anxiety EXT:  No edema, ecchymosis, or cyanosis  Signed, Haws MARLA Wendel, MD  08/04/2024 12:07 PM    [facility] [address] [place], [place], [place]  [address] Phone: [phone]; Fax: [phone]   Note:  This document was  prepared using Dragon voice recognition software and may include unintentional dictation errors.

## 2024-07-23 NOTE — H&P (View-Only) (Signed)
 Cardiology Office Note:   Date:  08/04/2024  ID:  Sarah Holland, DOB 02/11/1991, MRN [id number] PCP:  Sherial Bail, MD  [facility] Providers Cardiologist:  Wendel Haws, MD Referring MD: Sherial Bail, MD  Chief Complaint/Reason for Referral: PFO and stroke ASSESSMENT:    1. Acute cerebrovascular accident (CVA) (HCC)   2. PFO (patent foramen ovale)   3. Alpha-thalassemia (HCC)   4. Pre-procedure lab exam       PLAN:   In order of problems listed above: Acute CVA: Patient has had a complete evaluation for cryptogenic stroke.  Given her elevated rope score we will refer the patient for elective PFO closure to reduce the patient's risk for recurrent stroke.  Informed her that she will need to be on dual antiplatelet therapy after the procedure for 6 months.  She will need antibiotics for any dental work including cleanings for 6 months as well.  We will schedule her for follow-up 1 month after the procedure. PFO: See discussion above. Alpha thalassemia: Followed by hematology.            Dispo:  Return in about 5 weeks (around 09/08/2024).       Labs/tests ordered: Orders Placed This Encounter  Procedures   CBC   Basic metabolic panel with GFR   EKG 87-Ozji    Current medicines are reviewed at length with the patient today.  The patient does not have concerns regarding medicines.  I spent 33 minutes reviewing all clinical data during and prior to this visit including all relevant imaging studies, laboratories, clinical information from other health systems and prior notes from both Cardiology and other specialties, interviewing the patient, conducting a complete physical examination, and coordinating care in order to formulate a comprehensive and personalized evaluation and treatment plan.   History of Present Illness:    FOCUSED PROBLEM LIST:   CVA >> dysarthria and right-sided weakness July 2025 CT no acute abnormality CTA head and neck left M3  occlusion MRI no acute abnormality TCD bubble study Spencer degree 3 with Valsalva 2D Echo EF 60 to 65%, evidence of large PFO TEE moderate size PFO with small ASD with positive bubble study LE venous Doppler no DVT 30-day monitor no atrial fibrillation Rope score 9 Alpha thalassemia Followed by hematology oncology  July 2025:  Patient consents to use of AI scribe. The patient is a 33 year old female with the above listed medical problems referred for recommendations regarding PFO management in the context of a recent stroke.  The patient was in her normal state of health up until July 8 when she developed dysarthria and right-sided weakness while at work.  Her workup after presentation at [facility] as above.  The patient was not treated with lytics or interventional therapy due to complete resolution of symptoms.  She was ultimately discharged home on aspirin  and Plavix .  In the course of her workup a PFO was found and she is here to discuss further.  Since discharge, she has not experienced any recurrent stroke symptoms. However, she experiences palpitations approximately once a week, primarily when lying down to sleep, lasting only a few seconds. These episodes are sometimes accompanied by anxiety, sweating, and difficulty sleeping. She also experienced epistaxis on the morning of this visit, which took some time to stop but was not severe.  She has not returned to work since the event. She was initially scheduled to return to work the day after this visit.  She was seen by a hematologist,  Dr. Lonn, and was tested for thalassemia, which returned positive, although she has not yet been contacted with the results. She feels lightheaded most days.  Plan: Refer for 1 week monitor and follow-up in 3 weeks.  August 2025:  Patient consents to use of AI scribe. In the interim the patient's monitor demonstrated no atrial fibrillation.  She is here to discuss further management.  She has  not experienced any signs or symptoms of another stroke since her last visit. She is off Plavix  and has not experienced any significant bleeding while on aspirin . No swelling in her legs or other concerning symptoms have been noted.     Current Medications: Current Meds  Medication Sig   aspirin  EC 81 MG tablet Take 1 tablet (81 mg total) by mouth daily. Swallow whole.     Review of Systems:   Please see the history of present illness.    All other systems reviewed and are negative.     EKGs/Labs/Other Test Reviewed:   EKG: 2025 normal sinus rhythm  EKG Interpretation Date/Time:  Thursday August 04 2024 11:19:10 EDT Ventricular Rate:  92 PR Interval:  152 QRS Duration:  70 QT Interval:  324 QTC Calculation: 400 R Axis:   57  Text Interpretation: Normal sinus rhythm Normal ECG When compared with ECG of 27-Jun-2024 08:58, No significant change was found Confirmed by Wendel Haws ([id number]) on 08/04/2024 11:50:32 AM         Risk Assessment/Calculations:          Physical Exam:   VS:  BP 126/82   Pulse 84   Ht 5' 6 (1.676 m)   Wt 203 lb 6.4 oz (92.3 kg)   SpO2 97%   BMI 32.83 kg/m        Wt Readings from Last 3 Encounters:  08/04/24 203 lb 6.4 oz (92.3 kg)  07/19/24 210 lb (95.3 kg)  07/13/24 214 lb 9.6 oz (97.3 kg)      GENERAL:  No apparent distress, AOx3 HEENT:  No carotid bruits, +2 carotid impulses, no scleral icterus CAR: RRR no murmurs, gallops, rubs, or thrills RES:  Clear to auscultation bilaterally ABD:  Soft, nontender, nondistended, positive bowel sounds x 4 VASC:  +2 radial pulses, +2 carotid pulses NEURO:  CN 2-12 grossly intact; motor and sensory grossly intact PSYCH:  No active depression or anxiety EXT:  No edema, ecchymosis, or cyanosis  Signed, Haws MARLA Wendel, MD  08/04/2024 12:07 PM    [facility] [address] [place], [place], [place]  [address] Phone: [phone]; Fax: [phone]   Note:  This document was  prepared using Dragon voice recognition software and may include unintentional dictation errors.

## 2024-07-25 ENCOUNTER — Other Ambulatory Visit: Payer: Self-pay

## 2024-07-25 DIAGNOSIS — Q2112 Patent foramen ovale: Secondary | ICD-10-CM

## 2024-07-25 DIAGNOSIS — I639 Cerebral infarction, unspecified: Secondary | ICD-10-CM

## 2024-08-04 ENCOUNTER — Ambulatory Visit: Attending: Internal Medicine | Admitting: Internal Medicine

## 2024-08-04 ENCOUNTER — Encounter: Payer: Self-pay | Admitting: Internal Medicine

## 2024-08-04 VITALS — BP 126/82 | HR 84 | Ht 66.0 in | Wt 203.4 lb

## 2024-08-04 DIAGNOSIS — I639 Cerebral infarction, unspecified: Secondary | ICD-10-CM | POA: Diagnosis not present

## 2024-08-04 DIAGNOSIS — D560 Alpha thalassemia: Secondary | ICD-10-CM | POA: Diagnosis not present

## 2024-08-04 DIAGNOSIS — Q2112 Patent foramen ovale: Secondary | ICD-10-CM

## 2024-08-04 DIAGNOSIS — Z01812 Encounter for preprocedural laboratory examination: Secondary | ICD-10-CM | POA: Diagnosis not present

## 2024-08-04 NOTE — Patient Instructions (Signed)
 Medication Instructions:  Your physician recommends that you continue on your current medications as directed. Please refer to the Current Medication list given to you today.  *If you need a refill on your cardiac medications before your next appointment, please call your pharmacy*  Lab Work: TODAY: CBC, BMET If you have labs (blood work) drawn today and your tests are completely normal, you will receive your results only by: MyChart Message (if you have MyChart) OR A paper copy in the mail If you have any lab test that is abnormal or we need to change your treatment, we will call you to review the results.  Testing/Procedures: PFO closure.  Follow-Up: At [facility], you and your health needs are our priority.  As part of our continuing mission to provide you with exceptional heart care, our providers are all part of one team.  This team includes your primary Cardiologist (physician) and Advanced Practice Providers or APPs (Physician Assistants and Nurse Practitioners) who all work together to provide you with the care you need, when you need it.  Your next appointment:   September 12, 2024 at 11:10 AM  Provider:   Izetta Hummer, PA-C   We recommend signing up for the patient portal called MyChart.  Sign up information is provided on this After Visit Summary.  MyChart is used to connect with patients for Virtual Visits (Telemedicine).  Patients are able to view lab/test results, encounter notes, upcoming appointments, etc.  Non-urgent messages can be sent to your provider as well.    To learn more about what you can do with MyChart, go to [email or web address].

## 2024-08-05 ENCOUNTER — Ambulatory Visit: Payer: Self-pay | Admitting: Internal Medicine

## 2024-08-05 LAB — BASIC METABOLIC PANEL WITH GFR
BUN/Creatinine Ratio: 14 (ref 9–23)
BUN: 13 mg/dL (ref 6–20)
CO2: 19 mmol/L — AB (ref 20–29)
Calcium: 9.8 mg/dL (ref 8.7–10.2)
Chloride: 104 mmol/L (ref 96–106)
Creatinine, Ser: 0.92 mg/dL (ref 0.57–1.00)
Glucose: 70 mg/dL (ref 70–99)
Potassium: 4 mmol/L (ref 3.5–5.2)
Sodium: 139 mmol/L (ref 134–144)
eGFR: 84 mL/min/1.73 (ref >59)

## 2024-08-05 LAB — CBC
Hematocrit: 40.5 % (ref 34.0–46.6)
Hemoglobin: 12.7 g/dL (ref 11.1–15.9)
MCH: 25.5 pg — ABNORMAL LOW (ref 26.6–33.0)
MCHC: 31.4 g/dL — ABNORMAL LOW (ref 31.5–35.7)
MCV: 81 fL (ref 79–97)
Platelets: 276 x10E3/uL (ref 150–450)
RBC: 4.99 x10E6/uL (ref 3.77–5.28)
RDW: 13.5 % (ref 11.7–15.4)
WBC: 5.2 x10E3/uL (ref 3.4–10.8)

## 2024-08-10 ENCOUNTER — Telehealth: Payer: Self-pay

## 2024-08-10 NOTE — Telephone Encounter (Signed)
 SABRA

## 2024-08-12 ENCOUNTER — Other Ambulatory Visit: Payer: Self-pay

## 2024-08-12 ENCOUNTER — Observation Stay (HOSPITAL_COMMUNITY)

## 2024-08-12 ENCOUNTER — Inpatient Hospital Stay (HOSPITAL_COMMUNITY)

## 2024-08-12 ENCOUNTER — Encounter (HOSPITAL_COMMUNITY)
Admission: RE | Disposition: A | Discharge status: Home / Self Care | Payer: Self-pay | Source: Home / Self Care | Attending: Internal Medicine

## 2024-08-12 ENCOUNTER — Ambulatory Visit (HOSPITAL_COMMUNITY)

## 2024-08-12 ENCOUNTER — Other Ambulatory Visit (HOSPITAL_COMMUNITY)

## 2024-08-12 ENCOUNTER — Inpatient Hospital Stay (HOSPITAL_COMMUNITY)
Admission: RE | Admit: 2024-08-12 | Discharge: 2024-08-14 | DRG: 274 | Disposition: A | Discharge status: Home / Self Care | Attending: Internal Medicine | Admitting: Internal Medicine

## 2024-08-12 DIAGNOSIS — Z79899 Other long term (current) drug therapy: Secondary | ICD-10-CM | POA: Diagnosis not present

## 2024-08-12 DIAGNOSIS — Z833 Family history of diabetes mellitus: Secondary | ICD-10-CM | POA: Diagnosis not present

## 2024-08-12 DIAGNOSIS — I639 Cerebral infarction, unspecified: Secondary | ICD-10-CM

## 2024-08-12 DIAGNOSIS — I3139 Other pericardial effusion (noninflammatory): Secondary | ICD-10-CM | POA: Diagnosis not present

## 2024-08-12 DIAGNOSIS — I314 Cardiac tamponade: Secondary | ICD-10-CM | POA: Diagnosis not present

## 2024-08-12 DIAGNOSIS — Z8673 Personal history of transient ischemic attack (TIA), and cerebral infarction without residual deficits: Secondary | ICD-10-CM

## 2024-08-12 DIAGNOSIS — Z6832 Body mass index (BMI) 32.0-32.9, adult: Secondary | ICD-10-CM

## 2024-08-12 DIAGNOSIS — Z7902 Long term (current) use of antithrombotics/antiplatelets: Secondary | ICD-10-CM

## 2024-08-12 DIAGNOSIS — Q2112 Patent foramen ovale: Secondary | ICD-10-CM | POA: Diagnosis present

## 2024-08-12 DIAGNOSIS — E669 Obesity, unspecified: Secondary | ICD-10-CM | POA: Diagnosis present

## 2024-08-12 DIAGNOSIS — Z800 Family history of malignant neoplasm of digestive organs: Secondary | ICD-10-CM | POA: Diagnosis not present

## 2024-08-12 DIAGNOSIS — Z7982 Long term (current) use of aspirin: Secondary | ICD-10-CM

## 2024-08-12 DIAGNOSIS — Z8632 Personal history of gestational diabetes: Secondary | ICD-10-CM

## 2024-08-12 DIAGNOSIS — D560 Alpha thalassemia: Secondary | ICD-10-CM | POA: Diagnosis present

## 2024-08-12 DIAGNOSIS — F17290 Nicotine dependence, other tobacco product, uncomplicated: Secondary | ICD-10-CM | POA: Diagnosis present

## 2024-08-12 DIAGNOSIS — R079 Chest pain, unspecified: Secondary | ICD-10-CM | POA: Diagnosis present

## 2024-08-12 HISTORY — PX: CORONARY IMAGING/OCT: CATH118326

## 2024-08-12 HISTORY — PX: PATENT FORAMEN OVALE(PFO) CLOSURE: CATH118300

## 2024-08-12 HISTORY — PX: PERICARDIOCENTESIS: CATH118255

## 2024-08-12 LAB — ECHOCARDIOGRAM LIMITED
AR max vel: 2.58 cm2
AV Peak grad: 5.4 mmHg
Ao pk vel: 1.16 m/s
Area-P 1/2: 3.27 cm2
Height: 66 in
Height: 66 in
Height: 66 in
S' Lateral: 2.7 cm
Weight: 3248 [oz_av]
Weight: 3248 [oz_av]
Weight: 3248 [oz_av]

## 2024-08-12 LAB — CBC
HCT: 36.1 % (ref 36.0–46.0)
Hemoglobin: 11 g/dL — ABNORMAL LOW (ref 12.0–15.0)
MCH: 25.1 pg — ABNORMAL LOW (ref 26.0–34.0)
MCHC: 30.5 g/dL (ref 30.0–36.0)
MCV: 82.4 fL (ref 80.0–100.0)
Platelets: 221 K/uL (ref 150–400)
RBC: 4.38 MIL/uL (ref 3.87–5.11)
RDW: 13.6 % (ref 11.5–15.5)
WBC: 10.9 K/uL — ABNORMAL HIGH (ref 4.0–10.5)
nRBC: 0 % (ref 0.0–0.2)

## 2024-08-12 LAB — POCT ACTIVATED CLOTTING TIME
Activated Clotting Time: 216 s
Activated Clotting Time: 245 s

## 2024-08-12 LAB — BASIC METABOLIC PANEL WITH GFR
Anion gap: 12 (ref 5–15)
BUN: 15 mg/dL (ref 6–20)
CO2: 16 mmol/L — ABNORMAL LOW (ref 22–32)
Calcium: 8.6 mg/dL — ABNORMAL LOW (ref 8.9–10.3)
Chloride: 111 mmol/L (ref 98–111)
Creatinine, Ser: 0.91 mg/dL (ref 0.44–1.00)
GFR, Estimated: >60 mL/min (ref >60)
Glucose, Bld: 104 mg/dL — ABNORMAL HIGH (ref 70–99)
Potassium: 3.5 mmol/L (ref 3.5–5.1)
Sodium: 139 mmol/L (ref 135–145)

## 2024-08-12 LAB — PREGNANCY, URINE: Preg Test, Ur: NEGATIVE

## 2024-08-12 LAB — MRSA NEXT GEN BY PCR, NASAL: MRSA by PCR Next Gen: NOT DETECTED

## 2024-08-12 LAB — MAGNESIUM: Magnesium: 1.8 mg/dL (ref 1.7–2.4)

## 2024-08-12 LAB — GLUCOSE, CAPILLARY: Glucose-Capillary: 153 mg/dL — ABNORMAL HIGH (ref 70–99)

## 2024-08-12 SURGERY — PERICARDIOCENTESIS
Anesthesia: LOCAL

## 2024-08-12 SURGERY — PATENT FORAMEN OVALE (PFO) CLOSURE
Anesthesia: LOCAL

## 2024-08-12 MED ORDER — FENTANYL CITRATE (PF) 100 MCG/2ML IJ SOLN
INTRAMUSCULAR | Status: DC | PRN
  Administered 2024-08-12 (×4): 25 ug via INTRAVENOUS

## 2024-08-12 MED ORDER — SODIUM CHLORIDE 0.9% FLUSH: 3.0000 mL | INTRAVENOUS | Status: DC | PRN

## 2024-08-12 MED ORDER — LIDOCAINE HCL (PF) 1 % IJ SOLN
INTRAMUSCULAR | Status: DC | PRN
  Administered 2024-08-12: 10 mL

## 2024-08-12 MED ORDER — ONDANSETRON HCL 4 MG/2ML IJ SOLN
INTRAMUSCULAR | Status: DC | PRN
  Administered 2024-08-12: 4 mg via INTRAVENOUS

## 2024-08-12 MED ORDER — MIDAZOLAM HCL 2 MG/2ML IJ SOLN
INTRAMUSCULAR | Status: AC
  Filled 2024-08-12: qty 2

## 2024-08-12 MED ORDER — ORAL CARE MOUTH RINSE: 15.0000 mL | OROMUCOSAL | Status: DC | PRN

## 2024-08-12 MED ORDER — HEPARIN SODIUM (PORCINE) 1000 UNIT/ML IJ SOLN
INTRAMUSCULAR | Status: DC | PRN
  Administered 2024-08-12: 11000 [IU] via INTRAVENOUS
  Administered 2024-08-12: 3000 [IU] via INTRAVENOUS

## 2024-08-12 MED ORDER — FENTANYL CITRATE (PF) 100 MCG/2ML IJ SOLN
INTRAMUSCULAR | Status: AC
  Filled 2024-08-12: qty 2

## 2024-08-12 MED ORDER — SODIUM CHLORIDE 0.9 % IV SOLN: 250.0000 mL | INTRAVENOUS | Status: AC | PRN

## 2024-08-12 MED ORDER — ACETAMINOPHEN 325 MG PO TABS
650.0000 mg | ORAL_TABLET | ORAL | Status: DC | PRN
  Administered 2024-08-14: 650 mg via ORAL
  Filled 2024-08-12: qty 2

## 2024-08-12 MED ORDER — SODIUM CHLORIDE 0.9 % IV SOLN: 250.0000 mL | INTRAVENOUS | Status: DC | PRN

## 2024-08-12 MED ORDER — SODIUM CHLORIDE 0.9% FLUSH: 3.0000 mL | Freq: Two times a day (BID) | INTRAVENOUS | Status: DC

## 2024-08-12 MED ORDER — ASPIRIN 81 MG PO TBEC: 81.0000 mg | DELAYED_RELEASE_TABLET | Freq: Every day | ORAL | Status: DC

## 2024-08-12 MED ORDER — COLCHICINE 0.6 MG PO TABS
0.6000 mg | ORAL_TABLET | Freq: Two times a day (BID) | ORAL | Status: DC
  Administered 2024-08-12 – 2024-08-13 (×4): 0.6 mg via ORAL
  Filled 2024-08-12 (×4): qty 1

## 2024-08-12 MED ORDER — ASPIRIN 81 MG PO CHEW: 81.0000 mg | CHEWABLE_TABLET | ORAL | Status: DC

## 2024-08-12 MED ORDER — SODIUM CHLORIDE 0.9% FLUSH
3.0000 mL | Freq: Two times a day (BID) | INTRAVENOUS | Status: DC
  Administered 2024-08-12 – 2024-08-14 (×5): 3 mL via INTRAVENOUS

## 2024-08-12 MED ORDER — ONDANSETRON HCL 4 MG/2ML IJ SOLN
4.0000 mg | Freq: Four times a day (QID) | INTRAMUSCULAR | Status: DC | PRN
  Administered 2024-08-13: 4 mg via INTRAVENOUS
  Filled 2024-08-12: qty 2

## 2024-08-12 MED ORDER — HEPARIN (PORCINE) IN NACL 1000-0.9 UT/500ML-% IV SOLN
INTRAVENOUS | Status: DC | PRN
  Administered 2024-08-12 (×3): 500 mL via INTRAVENOUS

## 2024-08-12 MED ORDER — MAGNESIUM SULFATE 2 GM/50ML IV SOLN
2.0000 g | Freq: Once | INTRAVENOUS | Status: AC
  Administered 2024-08-12: 2 g via INTRAVENOUS
  Filled 2024-08-12: qty 50

## 2024-08-12 MED ORDER — CEFAZOLIN SODIUM-DEXTROSE 2-4 GM/100ML-% IV SOLN
2.0000 g | INTRAVENOUS | Status: DC
  Filled 2024-08-12: qty 100

## 2024-08-12 MED ORDER — CLOPIDOGREL BISULFATE 75 MG PO TABS
75.0000 mg | ORAL_TABLET | Freq: Every day | ORAL | 1 refills | Status: DC
Start: 2024-08-12 — End: 2024-08-12

## 2024-08-12 MED ORDER — POTASSIUM CHLORIDE CRYS ER 20 MEQ PO TBCR
40.0000 meq | EXTENDED_RELEASE_TABLET | Freq: Once | ORAL | Status: AC
  Administered 2024-08-12: 40 meq via ORAL
  Filled 2024-08-12: qty 2

## 2024-08-12 MED ORDER — HYDRALAZINE HCL 20 MG/ML IJ SOLN: 10.0000 mg | INTRAMUSCULAR | Status: AC | PRN

## 2024-08-12 MED ORDER — MORPHINE SULFATE (PF) 2 MG/ML IV SOLN
2.0000 mg | INTRAVENOUS | Status: DC | PRN
  Administered 2024-08-12 – 2024-08-13 (×5): 2 mg via INTRAVENOUS
  Filled 2024-08-12 (×5): qty 1

## 2024-08-12 MED ORDER — NOREPINEPHRINE 4 MG/250ML-% IV SOLN
INTRAVENOUS | Status: AC
  Filled 2024-08-12: qty 250

## 2024-08-12 MED ORDER — MIDAZOLAM HCL 2 MG/2ML IJ SOLN
INTRAMUSCULAR | Status: DC | PRN
  Administered 2024-08-12 (×4): 1 mg via INTRAVENOUS

## 2024-08-12 MED ORDER — LABETALOL HCL 5 MG/ML IV SOLN: 10.0000 mg | INTRAVENOUS | Status: AC | PRN

## 2024-08-12 MED ORDER — ONDANSETRON HCL 4 MG/2ML IJ SOLN
INTRAMUSCULAR | Status: AC
  Filled 2024-08-12: qty 2

## 2024-08-12 MED ORDER — CEFAZOLIN SODIUM-DEXTROSE 2-3 GM-%(50ML) IV SOLR
INTRAVENOUS | Status: DC | PRN
  Administered 2024-08-12: 2 g via INTRAVENOUS

## 2024-08-12 MED ORDER — LABETALOL HCL 5 MG/ML IV SOLN: 10.0000 mg | INTRAVENOUS | Status: DC | PRN

## 2024-08-12 MED ORDER — FREE WATER
500.0000 mL | Freq: Once | Status: AC
  Administered 2024-08-12: 500 mL via ORAL

## 2024-08-12 MED ORDER — FENTANYL CITRATE PF 50 MCG/ML IJ SOSY
PREFILLED_SYRINGE | INTRAMUSCULAR | Status: AC
  Filled 2024-08-12: qty 1

## 2024-08-12 MED ORDER — SODIUM CHLORIDE 0.9% FLUSH
3.0000 mL | Freq: Two times a day (BID) | INTRAVENOUS | Status: DC
  Administered 2024-08-12 – 2024-08-14 (×4): 3 mL via INTRAVENOUS

## 2024-08-12 MED ORDER — CEFAZOLIN SODIUM-DEXTROSE 2-4 GM/100ML-% IV SOLN
INTRAVENOUS | Status: AC
  Filled 2024-08-12: qty 100

## 2024-08-12 MED ORDER — ALPRAZOLAM 0.25 MG PO TABS: 0.2500 mg | ORAL_TABLET | Freq: Two times a day (BID) | ORAL | Status: DC | PRN

## 2024-08-12 MED ORDER — GABAPENTIN 100 MG PO CAPS
200.0000 mg | ORAL_CAPSULE | Freq: Two times a day (BID) | ORAL | Status: DC
  Administered 2024-08-12 – 2024-08-14 (×5): 200 mg via ORAL
  Filled 2024-08-12 (×5): qty 2

## 2024-08-12 MED ORDER — HEPARIN SODIUM (PORCINE) 1000 UNIT/ML IJ SOLN
INTRAMUSCULAR | Status: AC
  Filled 2024-08-12: qty 20

## 2024-08-12 MED ORDER — PHENYLEPHRINE 80 MCG/ML (10ML) SYRINGE FOR IV PUSH (FOR BLOOD PRESSURE SUPPORT)
PREFILLED_SYRINGE | INTRAVENOUS | Status: AC
  Filled 2024-08-12: qty 10

## 2024-08-12 MED ORDER — LIDOCAINE HCL (PF) 1 % IJ SOLN
INTRAMUSCULAR | Status: AC
  Filled 2024-08-12: qty 30

## 2024-08-12 MED ORDER — FENTANYL CITRATE (PF) 100 MCG/2ML IJ SOLN
INTRAMUSCULAR | Status: DC | PRN
  Administered 2024-08-12: 25 ug via INTRAVENOUS

## 2024-08-12 MED ORDER — FREE WATER: 500.0000 mL | Freq: Once | Status: DC

## 2024-08-12 MED ORDER — CLOPIDOGREL BISULFATE 75 MG PO TABS: 75.0000 mg | ORAL_TABLET | Freq: Every day | ORAL | Status: DC

## 2024-08-12 MED ORDER — HYDROMORPHONE HCL 1 MG/ML IJ SOLN
1.0000 mg | INTRAMUSCULAR | Status: DC | PRN
  Administered 2024-08-12 – 2024-08-13 (×3): 1 mg via INTRAVENOUS
  Filled 2024-08-12 (×3): qty 1

## 2024-08-12 MED ORDER — CLOPIDOGREL BISULFATE 300 MG PO TABS: 300.0000 mg | ORAL_TABLET | Freq: Once | ORAL | Status: DC

## 2024-08-12 MED ORDER — HYDRALAZINE HCL 20 MG/ML IJ SOLN: 10.0000 mg | INTRAMUSCULAR | Status: DC | PRN

## 2024-08-12 MED ORDER — MIDAZOLAM HCL 2 MG/2ML IJ SOLN
INTRAMUSCULAR | Status: DC | PRN
  Administered 2024-08-12: 1 mg via INTRAVENOUS

## 2024-08-12 SURGICAL SUPPLY — 16 items
CATH ACUNAV GE 8F-90 (CATHETERS) IMPLANT
CATH INFINITI 5 FR MPA2 (CATHETERS) IMPLANT
CLOSURE PERCLOSE PROSTYLE (VASCULAR PRODUCTS) IMPLANT
COVER SWIFTLINK CONNECTOR (BAG) IMPLANT
GUIDEWIRE SAFE TJ AMPLATZ EXST (WIRE) IMPLANT
KIT SINGLE USE MANIFOLD (KITS) IMPLANT
KIT SYRINGE INJ CVI SPIKEX1 (MISCELLANEOUS) IMPLANT
MAT PREVALON FULL STRYKER (MISCELLANEOUS) IMPLANT
OCCLUDER CARDIOFORM SEPTAL 25 (Prosthesis & Implant Heart) IMPLANT
PACK CARDIAC CATHETERIZATION (CUSTOM PROCEDURE TRAY) ×1 IMPLANT
SHEATH DRYSEAL FLEX 12FR 33CM (SHEATH) IMPLANT
SHEATH INTROD W/O MIN 9FR 25CM (SHEATH) IMPLANT
SHEATH PINNACLE 6F 10CM (SHEATH) IMPLANT
SHEATH PINNACLE 8F 10CM (SHEATH) IMPLANT
WIRE EMERALD 3MM-J .035X150CM (WIRE) IMPLANT
WIRE MICRO SET SILHO 5FR 7 (SHEATH) IMPLANT

## 2024-08-12 SURGICAL SUPPLY — 6 items
ELECT DEFIB PAD ADLT CADENCE (PAD) IMPLANT
EVACUATOR 1/8 PVC DRAIN (DRAIN) IMPLANT
TRAY PERICARDIOCENTESIS 6FX60 (TRAY / TRAY PROCEDURE) IMPLANT
TUBING CIL FLEX 10 FLL-RA (TUBING) IMPLANT
WIRE MICRO SET 5FR 12 (WIRE) IMPLANT
WIRE MICRO SET SILHO 5FR 7 (SHEATH) IMPLANT

## 2024-08-12 NOTE — Progress Notes (Signed)
 Patient returned from the PFO procedure.While getting the patient settled she complained of feeling something different when she breathed. Patient denied pain and shortness of breath. Dr. Wendel came into the room and was notified. Echo was completed. Due to the findings the patient was transferred to 2 Heart. Report was given to [facility] on the unit.

## 2024-08-12 NOTE — Plan of Care (Signed)

## 2024-08-12 NOTE — Progress Notes (Signed)
 Echocardiogram 2D Echocardiogram has been performed.  Koleen KANDICE Popper, RDCS 08/12/2024, 11:44 AM

## 2024-08-12 NOTE — Consult Note (Addendum)
 Advanced Heart Failure Team Consult Note   Primary Physician: Sherial Bail, MD Cardiologist:  None  Reason for Consultation: Pericardial effusion post PFO closure  HPI:    Sarah Holland is seen today for evaluation of pericardial effusion post PFO closure at the request of Dr. Wendel.   Sarah Holland is a 33 y.o. AAF with cryptogenic stroke 06/07/24, alpha thalassemia and PFO.   Followed by Dr. Wendel. Decision made to proceed with PFO closure in the setting of cryptogenic stroke in July.   Today she underwent successful PFO closure. Procedure complicated by small pericardial effusion noted on limited echo. Decision made to send to [facility] for further monitoring.   On arrival to the unit patient with rapid decompensation. Lethargic, diaphoretic, 10/10 sternal CP and BP rapidly dropping. POCUS at bedside with large pericardial effusion. NE and fluid bolus started. Attempted drain placement at bedside with no success. Taken emergently to cath lab.   Home Medications Prior to Admission medications   Medication Sig Start Date End Date Taking? Authorizing Provider  aspirin  EC 81 MG tablet Take 81 mg by mouth daily. Swallow whole.   Yes [provider]    Past Medical History: Past Medical History:  Diagnosis Date   Elevated blood pressure complicating pregnancy in third trimester, antepartum 09/27/2019   Encounter for induction of labor 09/28/2019   Family history of pancreatic cancer    8/23 cancer genetic testing letter sent   Gestational diabetes    Gestational hypertension 09/28/2019   Obesity (BMI 30-39.9)    Obesity complicating peripregnancy, antepartum 08/09/2019   Stroke (HCC) 06/07/2024   Supervision of high risk pregnancy, antepartum 04/05/2019   Clinic [facility] Prenatal Labs Dating LMP Blood type: B/Positive/-- (04/22 1503)  Genetic Screen NIPS: Negative XY Antibody:Negative (04/22 1503) Anatomic US  Complete 05/16/2019 Rubella: 3.96 (04/22 1503)  Varicella: Immune GTT Early:               Third trimester: 204; 3 hr 98/219/204/140- RPR: Non Reactive (04/22 1503)  Rhogam N/A HBsAg: Negative (04/22 1503)  TDaP vaccine        08/02/19                Past Surgical History: Past Surgical History:  Procedure Laterality Date   APPENDECTOMY     TRANSESOPHAGEAL ECHOCARDIOGRAM (CATH LAB) N/A 06/09/2024   Procedure: TRANSESOPHAGEAL ECHOCARDIOGRAM;  Surgeon: Delford Maude BROCKS, MD;  Location: [facility] INVASIVE CV LAB;  Service: Cardiovascular;  Laterality: N/A;    Family History: Family History  Problem Relation Age of Onset   Liver cancer Father 24   Diabetes Maternal Grandmother    Pancreatic cancer Paternal Grandmother        4s    Social History: Social History   Socioeconomic History   Marital status: Married    Spouse name: Not on file   Number of children: 3   Years of education: Not on file   Highest education level: Not on file  Occupational History   Occupation: [employer]  Tobacco Use   Smoking status: Former    Current packs/day: 0.00    Average packs/day: 0.3 packs/day for 3.0 years (0.8 ttl pk-yrs)    Types: Cigarettes    Start date: 12/01/2014    Quit date: 12/01/2017    Years since quitting: 6.7   Smokeless tobacco: Never  Vaping Use   Vaping status: Some Days   Substances: Nicotine  Substance and Sexual Activity   Alcohol use: No   Drug  use: Never   Sexual activity: Not Currently    Birth control/protection: Injection  Other Topics Concern   Not on file  Social History Narrative   Not on file   Social Drivers of Health   Financial Resource Strain: Low Risk  (03/24/2024)   Received from [facility]   Overall Financial Resource Strain (CARDIA)    Difficulty of Paying Living Expenses: Not hard at all  Food Insecurity: No Food Insecurity (06/07/2024)   Hunger Vital Sign    Worried About Running Out of Food in the Last Year: Never true    Ran Out of Food in the Last Year: Never true  Transportation  Needs: No Transportation Needs (06/07/2024)   PRAPARE - Administrator, Civil Service (Medical): No    Lack of Transportation (Non-Medical): No  Physical Activity: Not on file  Stress: Not on file  Social Connections: Not on file    Allergies:  No Known Allergies  Objective:    Vital Signs:   Temp:  [98.8 F (37.1 C)] 98.8 F (37.1 C) (09/12 0616) Pulse Rate:  [76-110] 76 (09/12 0902) Resp:  [15-31] 24 (09/12 0902) BP: (113-139)/(74-105) 116/84 (09/12 0902) SpO2:  [94 %-100 %] 98 % (09/12 0902) Weight:  [92.1 kg] 92.1 kg (09/12 0616)    Weight change: Filed Weights   08/12/24 0616  Weight: 92.1 kg    Intake/Output:  No intake or output data in the 24 hours ending 08/12/24 1114    Physical Exam  General:  critically ill, lethargic Cor: Regular rate & rhythm. Muffled heart sounds Lungs: clear Extremities: no edema  Neuro: lethargic, nods appropriately to questions  Telemetry   NSR 90s- low 100s  EKG    NSR 70s  Labs   Basic Metabolic Panel: No results for input(s): NA, K, CL, CO2, GLUCOSE, BUN, CREATININE, CALCIUM, MG, PHOS in the last 168 hours.  Liver Function Tests: No results for input(s): AST, ALT, ALKPHOS, BILITOT, PROT, ALBUMIN in the last 168 hours. No results for input(s): LIPASE, AMYLASE in the last 168 hours. No results for input(s): AMMONIA  in the last 168 hours.  CBC: No results for input(s): WBC, NEUTROABS, HGB, HCT, MCV, PLT in the last 168 hours.  Cardiac Enzymes: No results for input(s): CKTOTAL, CKMB, CKMBINDEX, TROPONINI in the last 168 hours.  BNP: BNP (last 3 results) No results for input(s): BNP in the last 8760 hours.  ProBNP (last 3 results) No results for input(s): PROBNP in the last 8760 hours.   CBG: No results for input(s): GLUCAP in the last 168 hours.  Coagulation Studies: No results for input(s): LABPROT, INR in the last 72  hours.   Imaging   CARDIAC CATHETERIZATION Addendum Date: 08/12/2024 1.  Successful ICE-guided PFO closure with 25 mm Gore cardio form device. Summary: 4-hour bedrest, limited echo, Plavix  load 300 mg later today and dual antiplatelet therapy with Plavix  and aspirin  for 6 months. ADDENDUM:  Post procedural limited echo demonstrated a small pericardial effusion.  Patient hemodynamically stable.  Will hold plavix  for now, admit to 2H for observation with repeat TTE later today.  Discussed with Dr. Zenaida, the [facility] attending, patient, and patient's mother.  Result Date: 08/12/2024 1.  Successful ICE-guided PFO closure with 25 mm Gore cardio form device. Summary: 4-hour bedrest, limited echo, Plavix  load 300 mg later today and dual antiplatelet therapy with Plavix  and aspirin  for 6 months.    Medications:   Current Medications:  fentaNYL   phenylephrine        sodium chloride  flush  3 mL Intravenous Q12H    Infusions:  sodium chloride      norepinephrine       Patient Profile   Sarah Holland is a 33 y.o. AAF with cryptogenic stroke 06/07/24, alpha thalassemia and PFO. Admitted post PFO closure, complicated by large pericardial effusion.  Assessment/Plan   PFO closure complicated by large pericardial effusion - S/p successful PFO closure 08/12/24 - Small pericardial effusion noticed on limited echo post procedure>2H for further obs.  - Once on the unit patient diaphoretic, 10/10 CP, lethargic, and hypotensive. POCUS by Dr. Zenaida and Dr. Wendel showed large pericardial effusion. Bedside drainage attempted, unsuccessful.  - Taken emergently to cath lab for pericardiocentesis / drain placement - Briefly required levo, plan to wean as tolerated.  - Plan for plavix /ASA once bleeding resolved - Check CBC.  - Start colchicine  0.6 mg BID - Repeat limited echo later today  Hx CVA - July 2025 - s/p PFO closure - plan for plavix  and ASA once bleeding resolved - no residual  Alpha  thalassemia - followed by hematology  Length of Stay: 1  Beckey LITTIE Coe, NP  08/12/2024, 11:14 AM   Advanced Heart Failure Team Pager [phone] (M-F; 7a - 5p)  Please contact [facility] for night-coverage after hours (4p -7a ) and weekends on [email or web address]

## 2024-08-12 NOTE — Discharge Instructions (Addendum)
 You will require antibiotics prior to any dental work, including cleanings, for 6 months after your PFO/ASD closure. This is to protect the device from potentially getting infected from bacteria in your mouth entering your bloodstream. The medication will be called into your pharmacy at your 1 month follow up appointment. If you require dental work before that time, please call the office and it will be called into your pharmacy on file. Please pick this up to have ready before any scheduled dental work. Instructions will be outlined on the bottle.   Groin Site Care Refer to this sheet in the next few weeks. These instructions provide you with information on caring for yourself after your procedure. Your caregiver may also give you more specific instructions. Your treatment has been planned according to current medical practices, but problems sometimes occur. Call your caregiver if you have any problems or questions after your procedure. HOME CARE INSTRUCTIONS You may shower 24 hours after the procedure. Remove the bandage (dressing) and gently wash the site with plain soap and water . Gently pat the site dry.  Do not apply powder or lotion to the site.  Do not sit in a bathtub, swimming pool, or whirlpool for 5 to 7 days.  No bending, squatting, or lifting anything over 10 pounds (4.5 kg) for 1 week Inspect the site at least twice daily.  Do not drive home if you are discharged the same day of the procedure. Have someone else drive you.  You may drive 24 hours after the procedure unless otherwise instructed by your caregiver.  What to expect: Any bruising will usually fade within 1 to 2 weeks.  Blood that collects in the tissue (hematoma) may be painful to the touch. It should usually decrease in size and tenderness within 1 to 2 weeks.  SEEK IMMEDIATE MEDICAL CARE IF: You have unusual pain at the groin site or down the affected leg.  You have redness, warmth, swelling, or pain at the groin site.   You have drainage (other than a small amount of blood on the dressing).  You have chills.  You have a fever or persistent symptoms for more than 72 hours.  You have a fever and your symptoms suddenly get worse.  Your leg becomes pale, cool, tingly, or numb.  You have heavy bleeding from the site. Hold pressure on the site.

## 2024-08-12 NOTE — Progress Notes (Signed)
 Echocardiogram 2D Echocardiogram has been performed.  Sarah Holland 08/12/2024, 4:40 PM

## 2024-08-12 NOTE — Interval H&P Note (Signed)
 History and Physical Interval Note:  08/12/2024 6:36 AM  Rolin KATHEE Lesches  has presented today for surgery, with the diagnosis of pfo.  The various methods of treatment have been discussed with the patient and family. After consideration of risks, benefits and other options for treatment, the patient has consented to  Procedure(s): PATENT FORAMEN OVALE(PFO) CLOSURE (N/A) as a surgical intervention.  The patient's history has been reviewed, patient examined, no change in status, stable for surgery.  I have reviewed the patient's chart and labs.  Questions were answered to the patient's satisfaction.     Whitleigh Garramone K Lyra Alaimo

## 2024-08-12 NOTE — TOC Initial Note (Signed)
 Transition of Care [facility]) - Initial/Assessment Note    Patient Details  Name: Sarah Holland MRN: [id number] Date of Birth: Aug 03, 1991  [facility] CM/SW Contact:    Justina Delcia Czar, RN Phone Number: [phone] 08/12/2024, 3:55 PM  Clinical Narrative:                 Spoke to pt at bedside. States she will need FMLA completed. She will have family bring paperwork.  Chart reviewed for discharge readiness, patient not medically stable for d/c. Inpatient CM/CSW will continue to monitor pt's advancement through interdisciplinary progression rounds. If new pt transition needs arise, MD please place a TOC consult.    Expected Discharge Plan: Home/Self Care Barriers to Discharge: Continued Medical Work up   Patient Goals and CMS Choice Patient states their goals for this hospitalization and ongoing recovery are:: wants to recover          Expected Discharge Plan and Services   Discharge Planning Services: CM Consult   Living arrangements for the past 2 months: Single Family Home Expected Discharge Date: 08/12/24                                    Prior Living Arrangements/Services Living arrangements for the past 2 months: Single Family Home Lives with:: Spouse Patient language and need for interpreter reviewed:: Yes Do you feel safe going back to the place where you live?: Yes        Care giver support system in place?: Yes (comment)   Criminal Activity/Legal Involvement Pertinent to Current Situation/Hospitalization: No - Comment as needed  Activities of Daily Living      Permission Sought/Granted Permission sought to share information with : Case Manager, Family Supports, PCP Permission granted to share information with : Yes, Verbal Permission Granted  Share Information with NAME: Reena Drones  Permission granted to share info w AGENCY: PCP  Permission granted to share info w Relationship: mother  Permission granted to share info w  Contact Information: [phone]  Emotional Assessment Appearance:: Appears stated age Attitude/Demeanor/Rapport: Engaged Affect (typically observed): Accepting Orientation: : Oriented to Self, Oriented to Place, Oriented to  Time, Oriented to Situation   Psych Involvement: No (comment)  Admission diagnosis:  PFO (patent foramen ovale) [Q21.12] Chest pain [R07.9] Pericardial effusion [I31.39] Patient Active Problem List   Diagnosis Date Noted   Chest pain 08/12/2024   Pericardial effusion 08/12/2024   Microcytic anemia 06/13/2024   Vaping nicotine dependence, 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   PFO (patent foramen ovale) 06/13/2024   Acute cerebrovascular accident (CVA) (HCC) 06/07/2024   Migraines 06/07/2024   Dysplasia of cervix, 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 [place] [address] Phone: [phone] Fax: [phone]     Social Drivers of Health (SDOH) Social History: SDOH Screenings   Food Insecurity: No Food Insecurity (06/07/2024)  Housing: Low Risk  (06/07/2024)  Transportation  Needs: No Transportation Needs (06/07/2024)  Utilities: Not At Risk (06/07/2024)  Depression (PHQ2-9): Low Risk  (05/01/2022)  Financial Resource Strain: Low Risk  (03/24/2024)   Received from [facility]  Tobacco Use: Medium Risk (08/04/2024)   SDOH Interventions:     Readmission Risk Interventions     No data to display

## 2024-08-13 ENCOUNTER — Encounter (HOSPITAL_COMMUNITY): Payer: Self-pay | Admitting: Internal Medicine

## 2024-08-13 DIAGNOSIS — I314 Cardiac tamponade: Secondary | ICD-10-CM

## 2024-08-13 LAB — BASIC METABOLIC PANEL WITH GFR
Anion gap: 8 (ref 5–15)
BUN: 12 mg/dL (ref 6–20)
CO2: 20 mmol/L — ABNORMAL LOW (ref 22–32)
Calcium: 8.7 mg/dL — ABNORMAL LOW (ref 8.9–10.3)
Chloride: 106 mmol/L (ref 98–111)
Creatinine, Ser: 0.83 mg/dL (ref 0.44–1.00)
GFR, Estimated: >60 mL/min (ref >60)
Glucose, Bld: 126 mg/dL — ABNORMAL HIGH (ref 70–99)
Potassium: 4.6 mmol/L (ref 3.5–5.1)
Sodium: 134 mmol/L — ABNORMAL LOW (ref 135–145)

## 2024-08-13 LAB — MAGNESIUM: Magnesium: 2.3 mg/dL (ref 1.7–2.4)

## 2024-08-13 MED ORDER — HYDROMORPHONE HCL 1 MG/ML IJ SOLN: 1.0000 mg | Freq: Four times a day (QID) | INTRAMUSCULAR | Status: DC | PRN

## 2024-08-13 MED ORDER — ASPIRIN 500 MG PO TABS: 750.0000 mg | ORAL_TABLET | Freq: Three times a day (TID) | ORAL | 0 refills | Status: DC

## 2024-08-13 MED ORDER — PANTOPRAZOLE SODIUM 40 MG PO TBEC
40.0000 mg | DELAYED_RELEASE_TABLET | Freq: Every day | ORAL | Status: DC
  Administered 2024-08-13 – 2024-08-14 (×2): 40 mg via ORAL
  Filled 2024-08-13 (×2): qty 1

## 2024-08-13 MED ORDER — SENNA 8.6 MG PO TABS
1.0000 | ORAL_TABLET | Freq: Every evening | ORAL | Status: DC | PRN
  Administered 2024-08-13: 8.6 mg via ORAL
  Filled 2024-08-13: qty 1

## 2024-08-13 MED ORDER — CHLORHEXIDINE GLUCONATE CLOTH 2 % EX PADS
6.0000 | MEDICATED_PAD | Freq: Every day | CUTANEOUS | Status: DC
  Administered 2024-08-13 – 2024-08-14 (×2): 6 via TOPICAL

## 2024-08-13 MED ORDER — OXYCODONE HCL 5 MG PO TABS
5.0000 mg | ORAL_TABLET | Freq: Four times a day (QID) | ORAL | Status: DC | PRN
  Administered 2024-08-13 – 2024-08-14 (×3): 5 mg via ORAL
  Filled 2024-08-13 (×3): qty 1

## 2024-08-13 MED ORDER — CLOPIDOGREL BISULFATE 75 MG PO TABS
75.0000 mg | ORAL_TABLET | Freq: Every day | ORAL | Status: DC
  Administered 2024-08-13 – 2024-08-14 (×2): 75 mg via ORAL
  Filled 2024-08-13 (×2): qty 1

## 2024-08-13 MED ORDER — ASPIRIN 325 MG PO TABS
750.0000 mg | ORAL_TABLET | Freq: Three times a day (TID) | ORAL | Status: DC
  Administered 2024-08-13 (×3): 812.5 mg via ORAL
  Filled 2024-08-13 (×3): qty 3

## 2024-08-13 MED ORDER — POLYETHYLENE GLYCOL 3350 17 G PO PACK
17.0000 g | PACK | Freq: Every day | ORAL | Status: DC
  Filled 2024-08-13: qty 1

## 2024-08-13 NOTE — Progress Notes (Signed)
 Advanced Heart Failure Rounding Note  Cardiologist: None  Chief Complaint: Pericardial effusion Subjective:    Patient feeling better this morning, minimal output from drain yesterday. To start on antiplatelet therapy after discussion with Dr. Wendel, still having significant chest pain. Opiate regimen adjusted.    Objective:   Weight Range: 92.1 kg Body mass index is 32.77 kg/m.   Vital Signs:   Temp:  [97.8 F (36.6 C)-98.6 F (37 C)] 97.8 F (36.6 C) (09/13 0843) Pulse Rate:  [57-132] 88 (09/13 0900) Resp:  [7-31] 18 (09/13 0900) BP: (54-148)/(36-107) 98/68 (09/13 0900) SpO2:  [93 %-100 %] 94 % (09/13 0900)    Weight change: Filed Weights   08/12/24 0616  Weight: 92.1 kg    Intake/Output:   Intake/Output Summary (Last 24 hours) at 08/13/2024 1045 Last data filed at 08/13/2024 0600 Gross per 24 hour  Intake 1121.53 ml  Output 915 ml  Net 206.53 ml      Physical Exam    GENERAL: NAD, well appearing PULM:  Normal work of breathing, CTAB CARDIAC:  JVP: flat         Tachycardia rate with regular rhythm. No murmurs, rubs or gallops.  Trace edema. Warm and well perfused extremities. ABDOMEN: Soft, non-tender, non-distended. NEUROLOGIC: Patient is oriented x3 with no focal or lateralizing neurologic deficits.    Telemetry   Sinus tachycardia in the 100s  Medications:     Scheduled Medications:  aspirin   812.5 mg Oral TID   Chlorhexidine  Gluconate Cloth  6 each Topical Daily   clopidogrel   75 mg Oral Daily   colchicine   0.6 mg Oral BID   gabapentin   200 mg Oral BID   pantoprazole   40 mg Oral Daily   sodium chloride  flush  3 mL Intravenous Q12H   sodium chloride  flush  3 mL Intravenous Q12H    Infusions:  sodium chloride       PRN Medications: sodium chloride , acetaminophen , ALPRAZolam , HYDROmorphone  (DILAUDID ) injection, ondansetron  (ZOFRAN ) IV, mouth rinse, oxyCODONE , sodium chloride  flush, sodium chloride  flush    Patient Profile    Sarah Holland is a 33 y.o. AAF with cryptogenic stroke 06/07/24, alpha thalassemia and PFO. Admitted post PFO closure, complicated by hemorrhagic pericardial effusion leading to pericardial tamponade.   Assessment/Plan   Pericardial tamponade:  - S/p drainage 9/12 with immediate improvement - Start high dose aspirin  and plavix  today - Repeat bedside echo - Limited echo tomorrow and then potentially pull drain - Continue colchcine 0.6mg  BID - Drain output 15mL yesterday - Repeat echo yesterday without residual effusion  Hx CVA - July 2025 - s/p PFO closure - Start DAPT as above   Alpha thalassemia - followed by hematology  CRITICAL CARE Performed by: Morene JINNY Brownie   Total critical care time: 35 minutes  Critical care time was exclusive of separately billable procedures and treating other patients.  Critical care was necessary to treat or prevent imminent or life-threatening deterioration.  Critical care was time spent personally by me on the following activities: development of treatment plan with patient and/or surrogate as well as nursing, discussions with consultants, evaluation of patient's response to treatment, examination of patient, obtaining history from patient or surrogate, ordering and performing treatments and interventions, ordering and review of laboratory studies, ordering and review of radiographic studies, pulse oximetry and re-evaluation of patient's condition.   Length of Stay: 1  Morene JINNY Brownie, MD  08/13/2024, 10:45 AM  Advanced Heart Failure Team Pager [phone] (M-F; 7a - 5p)  Please contact [facility] for night-coverage after hours (5p -7a ) and weekends on [email or web address]

## 2024-08-13 NOTE — Plan of Care (Signed)
  Problem: Activity: Goal: Ability to return to baseline activity level will improve Outcome: Progressing   Problem: Elimination: Goal: Will not experience complications related to bowel motility Outcome: Progressing   Problem: Pain Managment: Goal: General experience of comfort will improve and/or be controlled Outcome: Progressing   Problem: Safety: Goal: Ability to remain free from injury will improve Outcome: Progressing   Problem: Skin Integrity: Goal: Risk for impaired skin integrity will decrease Outcome: Progressing

## 2024-08-13 NOTE — Progress Notes (Addendum)
  Progress Note  Patient Name: Sarah Holland Date of Encounter: 08/13/2024 [facility] Cardiologist: Wendel  Interval Summary   POD 1 s/p PFO closure c/b by effusion >> drain >> resolution of tamponade  Repeat TTE yesterday afternoon without effusion.  No measurable output from pericardial drain.  No complaints today aside from mild pleuritic chest pain.  Vital Signs Vitals:   08/13/24 0100 08/13/24 0200 08/13/24 0300 08/13/24 0330  BP:  113/71 98/64   Pulse: 84 98 94   Resp: 18 (!) 23 15   Temp:    98.6 F (37 C)  TempSrc:    Oral  SpO2: 96% 93% 95%   Weight:      Height:        Intake/Output Summary (Last 24 hours) at 08/13/2024 0538 Last data filed at 08/12/2024 1900 Gross per 24 hour  Intake 801.53 ml  Output 615 ml  Net 186.53 ml      08/12/2024    6:16 AM 08/04/2024   11:14 AM 07/19/2024   10:56 AM  Last 3 Weights  Weight (lbs) 203 lb 203 lb 6.4 oz 210 lb  Weight (kg) 92.08 kg 92.262 kg 95.255 kg      Telemetry/ECG  Sinus tachycardia - Personally Reviewed  Physical Exam  GEN: No acute distress.   Neck: No JVD Cardiac:  RRR, no murmurs, rubs, or gallops; drain in place Respiratory: Clear to auscultation bilaterally. GI: Soft, nontender, non-distended  MS: No edema  Assessment & Plan  1.  Tamponade: Reviewed images from yesterday afternoon's limited TTE with no effusion seen.  Continue colchicine  0.6 twice daily, start Plavix  75 mg, increase aspirin  to high dose aspirin  750mg  TID x 10 days, start pantoprazole  40.  Keep drain in place and monitor output overnight after med changes.  If drain output minimal overnight and limited TTE reassuring tomorrow, then will d/c drain.  Up to chair and ambulate today.  Discussed with Dr. Zenaida, very much appreciate AHF/CCM care.  2.  PFO s/p closure: Continue ASA and start Plavix  as above.    3.  Alpha thalassemia: Continue to monitor    For questions or updates, please contact [facility] Please consult [email or web address] for contact info under        CRITICAL CARE TIME Performed by: Linville Decarolis K Julanne Schlueter, MD   Total critical care time: 30 minutes  Critical care time was exclusive of separately billable procedures and treating other patients.  Critical care was necessary to treat or prevent imminent or life-threatening deterioration.  Critical care was time spent personally by me on the following activities: development of treatment plan with patient and/or surrogate as well as nursing, discussions with consultants, evaluation of patient's response to treatment, examination of patient, obtaining history from patient or surrogate, ordering and performing treatments and interventions, ordering and review of laboratory studies, ordering and review of radiographic studies, pulse oximetry and re-evaluation of patient's condition. Signed, Juvencio Verdi K Trevon Strothers, MD

## 2024-08-13 NOTE — Plan of Care (Signed)
  Problem: Education: Goal: Understanding of CV disease, CV risk reduction, and recovery process will improve Outcome: Progressing Goal: Individualized Educational Video(s) Outcome: Progressing   Problem: Activity: Goal: Ability to return to baseline activity level will improve Outcome: Progressing   Problem: Cardiovascular: Goal: Ability to achieve and maintain adequate cardiovascular perfusion will improve Outcome: Progressing Goal: Vascular access site(s) Level 0-1 will be maintained Outcome: Progressing   Problem: Clinical Measurements: Goal: Ability to maintain clinical measurements within normal limits will improve Outcome: Progressing Goal: Will remain free from infection Outcome: Progressing Goal: Diagnostic test results will improve Outcome: Progressing Goal: Respiratory complications will improve Outcome: Progressing Goal: Cardiovascular complication will be avoided Outcome: Progressing

## 2024-08-14 ENCOUNTER — Inpatient Hospital Stay (HOSPITAL_COMMUNITY)

## 2024-08-14 DIAGNOSIS — I314 Cardiac tamponade: Secondary | ICD-10-CM

## 2024-08-14 DIAGNOSIS — I3139 Other pericardial effusion (noninflammatory): Secondary | ICD-10-CM

## 2024-08-14 LAB — CBC
HCT: 33.2 % — ABNORMAL LOW (ref 36.0–46.0)
Hemoglobin: 10.5 g/dL — ABNORMAL LOW (ref 12.0–15.0)
MCH: 25.4 pg — ABNORMAL LOW (ref 26.0–34.0)
MCHC: 31.6 g/dL (ref 30.0–36.0)
MCV: 80.2 fL (ref 80.0–100.0)
Platelets: 216 K/uL (ref 150–400)
RBC: 4.14 MIL/uL (ref 3.87–5.11)
RDW: 14 % (ref 11.5–15.5)
WBC: 5.8 K/uL (ref 4.0–10.5)
nRBC: 0 % (ref 0.0–0.2)

## 2024-08-14 LAB — MAGNESIUM: Magnesium: 1.9 mg/dL (ref 1.7–2.4)

## 2024-08-14 LAB — BASIC METABOLIC PANEL WITH GFR
Anion gap: 8 (ref 5–15)
BUN: 12 mg/dL (ref 6–20)
CO2: 20 mmol/L — ABNORMAL LOW (ref 22–32)
Calcium: 8.3 mg/dL — ABNORMAL LOW (ref 8.9–10.3)
Chloride: 109 mmol/L (ref 98–111)
Creatinine, Ser: 0.78 mg/dL (ref 0.44–1.00)
GFR, Estimated: >60 mL/min (ref >60)
Glucose, Bld: 115 mg/dL — ABNORMAL HIGH (ref 70–99)
Potassium: 3.8 mmol/L (ref 3.5–5.1)
Sodium: 137 mmol/L (ref 135–145)

## 2024-08-14 LAB — ECHOCARDIOGRAM LIMITED
Height: 66 in
S' Lateral: 2.7 cm
Weight: 3248 [oz_av]

## 2024-08-14 MED ORDER — ASPIRIN 81 MG PO TBEC: 81.0000 mg | DELAYED_RELEASE_TABLET | Freq: Every day | ORAL | 2 refills | Status: AC

## 2024-08-14 MED ORDER — ACETAMINOPHEN 325 MG PO TABS: 650.0000 mg | ORAL_TABLET | ORAL | Status: DC | PRN

## 2024-08-14 MED ORDER — ASPIRIN 325 MG PO TABS
650.0000 mg | ORAL_TABLET | Freq: Three times a day (TID) | ORAL | Status: DC
  Administered 2024-08-14 (×2): 650 mg via ORAL
  Filled 2024-08-14 (×2): qty 2

## 2024-08-14 MED ORDER — PANTOPRAZOLE SODIUM 40 MG PO TBEC: 40.0000 mg | DELAYED_RELEASE_TABLET | Freq: Every day | ORAL | 0 refills | Status: DC

## 2024-08-14 MED ORDER — CLOPIDOGREL BISULFATE 75 MG PO TABS: 75.0000 mg | ORAL_TABLET | Freq: Every day | ORAL | 5 refills | Status: AC

## 2024-08-14 MED ORDER — COLCHICINE 0.6 MG PO TABS
0.6000 mg | ORAL_TABLET | Freq: Every day | ORAL | Status: DC
  Administered 2024-08-14: 0.6 mg via ORAL
  Filled 2024-08-14: qty 1

## 2024-08-14 MED ORDER — COLCHICINE 0.6 MG PO TABS: 0.6000 mg | ORAL_TABLET | Freq: Two times a day (BID) | ORAL | 1 refills | Status: DC

## 2024-08-14 NOTE — Progress Notes (Signed)
 Cardiology Office Note:   Date:  08/18/2024  ID:  Sarah Holland, DOB [birth year], MRN [id number] PCP:  Sherial Bail, MD  [facility] Providers Cardiologist:  Wendel Haws, MD Referring MD: Sherial Bail, MD  Chief Complaint/Reason for Referral: PFO and stroke ASSESSMENT:    1. Acute cerebrovascular accident (CVA) (HCC)   2. PFO (patent foramen ovale)   3. Pericardial effusion with cardiac tamponade   4. Alpha-thalassemia (HCC)   5. Dyspnea, unspecified type        PLAN:   In order of problems listed above: Acute CVA: Status post PFO closure with 25 mm Gore cardio form device.  Continue Plavix  75 mg for 6 months.  The patient is on high-dose aspirin  for a total of 10 days and then will decrease to aspirin  81 mg.  She remain on aspirin  81 mg indefinitely.   She will need antibiotics for any dental work including cleanings for 6 months as well.  Will obtain limited echo to evaluate. PFO: See discussion above. Pericardial effusion with tamponade: Resolved after complete drainage.  Continue high dose aspirin  for 7 days as above then de-intensified to aspirin  81 mg.  Continue colchicine  0.6 daily daily for 2 months.  GI upset and diarrhea seem to be better on once a day colchicine .  Will obtain limited echo to evaluate given dyspnea.  I suspect this likely has more to do with blood loss from pericardial drain placement in her history of thalassemia.  She does not have a pulses paradoxus on examination today. Alpha thalassemia: Followed by hematology. Dyspnea: Will check CBC today and limited echo.  Her exam is not consistent with tamponade.            Dispo:  Return in about 2 weeks (around 09/01/2024).       Labs/tests ordered: Orders Placed This Encounter  Procedures   CBC   ECHOCARDIOGRAM LIMITED    Current medicines are reviewed at length with the patient today.  The patient does not have concerns regarding medicines.  I spent 33 minutes reviewing all  clinical data during and prior to this visit including all relevant imaging studies, laboratories, clinical information from other health systems and prior notes from both Cardiology and other specialties, interviewing the patient, conducting a complete physical examination, and coordinating care in order to formulate a comprehensive and personalized evaluation and treatment plan.   History of Present Illness:    FOCUSED PROBLEM LIST:   CVA >> dysarthria and right-sided weakness July 2025 PFO s/p closure with 25mm Gore Cardioform device September 2025 C/b pericardial tamponade>>drain Alpha thalassemia Followed by hematology oncology  July 2025:  Patient consents to use of AI scribe. The patient is a 33 year old female with the above listed medical problems referred for recommendations regarding PFO management in the context of a recent stroke.  The patient was in her normal state of health up until July 8 when she developed dysarthria and right-sided weakness while at work.  Her workup after presentation at [facility] as above.  The patient was not treated with lytics or interventional therapy due to complete resolution of symptoms.  She was ultimately discharged home on aspirin  and Plavix .  In the course of her workup a PFO was found and she is here to discuss further.  Since discharge, she has not experienced any recurrent stroke symptoms. However, she experiences palpitations approximately once a week, primarily when lying down to sleep, lasting only a few seconds. These episodes are sometimes accompanied by  anxiety, sweating, and difficulty sleeping. She also experienced epistaxis on the morning of this visit, which took some time to stop but was not severe.  She has not returned to work since the event. She was initially scheduled to return to work the day after this visit.  She was seen by a hematologist, Dr. Lonn, and was tested for thalassemia, which returned positive, although  she has not yet been contacted with the results. She feels lightheaded most days.  Plan: Refer for 1 week monitor and follow-up in 3 weeks.  August 2025:  Patient consents to use of AI scribe. In the interim the patient's monitor demonstrated no atrial fibrillation.  She is here to discuss further management.  She has not experienced any signs or symptoms of another stroke since her last visit. She is off Plavix  and has not experienced any significant bleeding while on aspirin . No swelling in her legs or other concerning symptoms have been noted.  Plan: Refer for elective PFO closure.  September 2025:  Patient consents to use of AI scribe. The patient underwent PFO closure.  In the recovery area she reported instant her chest discomfort.  Echocardiogram demonstrated a small effusion.  She was transferred to the to heart unit for monitoring.  A few hours later she developed hypotension and repeat echocardiogram demonstrated an enlarging pericardial effusion consistent with cardiac tamponade.  She was referred for urgent pericardiocentesis which was performed without complications.  She remained in the hospital for 2 additional days.  She was ultimately started on Plavix  to complement her aspirin .  Her aspirin  was intensified to high dose for 10 days and colchicine  0.6 twice daily as well as pantoprazole  was added to her regimen.  She was ultimately discharged home and is here for follow-up.  She developed lightheadedness, nausea and diarrhea and her colchicine  was decreased to once a day.  This seemed to help her symptoms.     Current Medications: Current Meds  Medication Sig   acetaminophen  (TYLENOL ) 325 MG tablet Take 2 tablets (650 mg total) by mouth every 4 (four) hours as needed for headache or mild pain (pain score 1-3).   aspirin  500 MG tablet Take 1.5 tablets (750 mg total) by mouth 3 (three) times daily.   [START ON 08/24/2024] aspirin  EC 81 MG tablet Take 1 tablet (81 mg total) by mouth  daily. Take 1 tablet (81 mg total) by mouth daily starting the next day after completing high dose Aspirin .   clopidogrel  (PLAVIX ) 75 MG tablet Take 1 tablet (75 mg total) by mouth daily.   colchicine  0.6 MG tablet Take 1 tablet (0.6 mg total) by mouth 2 (two) times daily.   pantoprazole  (PROTONIX ) 40 MG tablet Take 1 tablet (40 mg total) by mouth daily. Take 1 tablet (40 mg) every morning on an empty stomach while taking high dose Aspirin .     Review of Systems:   Please see the history of present illness.    All other systems reviewed and are negative.     EKGs/Labs/Other Test Reviewed:   EKG: 2025 normal sinus rhythm  EKG Interpretation Date/Time:    Ventricular Rate:    PR Interval:    QRS Duration:    QT Interval:    QTC Calculation:   R Axis:      Text Interpretation:           Risk Assessment/Calculations:          Physical Exam:   VS:  BP (!) 130/90  Pulse 82   Ht 5' 6 (1.676 m)   Wt 204 lb 9.6 oz (92.8 kg)   SpO2 99%   BMI 33.02 kg/m    HYPERTENSION CONTROL Vitals:   08/18/24 0915 08/18/24 0933  BP: (!) 134/94 (!) 130/90    The patient's blood pressure is elevated above target today.  In order to address the patient's elevated BP: Blood pressure will be monitored at home to determine if medication changes need to be made.      Wt Readings from Last 3 Encounters:  08/18/24 204 lb 9.6 oz (92.8 kg)  08/12/24 203 lb (92.1 kg)  08/04/24 203 lb 6.4 oz (92.3 kg)      GENERAL:  No apparent distress, AOx3 HEENT:  No carotid bruits, +2 carotid impulses, no scleral icterus CAR: RRR no murmurs, gallops, rubs, or thrills RES:  Clear to auscultation bilaterally ABD:  Soft, nontender, nondistended, positive bowel sounds x 4 VASC:  +2 radial pulses, +2 carotid pulses NEURO:  CN 2-12 grossly intact; motor and sensory grossly intact PSYCH:  No active depression or anxiety EXT:  No edema, ecchymosis, or cyanosis  Signed, Sieara Bremer K Allia Wiltsey, MD  08/18/2024  9:47 AM    [facility] [address] [place], [place], [place]  [address] Phone: [phone]; Fax: [phone]   Note:  This document was prepared using Dragon voice recognition software and may include unintentional dictation errors.

## 2024-08-14 NOTE — Discharge Summary (Signed)
 Discharge Summary   Patient ID: Sarah Holland MRN: [id number]; DOB: [birth year]  Admit date: 08/12/2024 Discharge date: 08/14/2024  PCP:  Sherial Bail, MD   [facility] Providers Cardiologist:  None       Discharge Diagnoses  Principal Problem:   PFO (patent foramen ovale) Active Problems:   Chest pain   Pericardial effusion   Diagnostic Studies/Procedures   1.  Successful ICE-guided PFO closure with 25 mm Gore cardio form device.   Summary: 4-hour bedrest, limited echo, Plavix  load 300 mg later today and dual antiplatelet therapy with Plavix  and aspirin  for 6 months.   _____________   History of Present Illness   Sarah Holland is a 33 y.o. female with history of CVA, PFO, alpha thalassemia presented for elective PFO closure after recent cryptogenic stroke.    Hospital Course   Consultants: advanced heart failure   PFO s/p closure Pericardial tamponade Closure with Dr. Wendel on 09/12. Procedure complicated by pericardial effusion, initially small, noted on limited echo. Decision made to send to [facility] for further monitoring. After arrival to the unit, patient with worsening chest pain, diaphoresis, and decreased mental status. She was found with large pericardial effusion, near complete compression of RA/RV on bedside echo. Patient started on NE. Bedside pericardiocentesis attempted but could not get access so patient taken to the cath lab for drain placement. Patient significantly improved on 9/13 with minimal drain output. She was started on Colchicine . Repeat echo without residual effusion. She was started on high dose ASA and plavix , tolerated well. Repeat echo on day of discharge with no pericardial effusion. Drain removed by Dr. Zenaida. Continue colchicine  0.6 twice daily x 2 months, Plavix  75 mg x 6 months, high dose aspirin  750mg  TID x 10 days then ASA 81mg  indefinitely, pantoprazole  40 while on high dose ASA.  1 week follow up has been  arranged.    Did the patient have an acute coronary syndrome (MI, NSTEMI, STEMI, etc) this admission?:  No                               Did the patient have a percutaneous coronary intervention (stent / angioplasty)?:  No.          _____________  Discharge Vitals Blood pressure 114/66, pulse (!) 102, temperature 98.7 F (37.1 C), temperature source Oral, resp. rate 16, height 5' 6 (1.676 m), weight 92.1 kg, SpO2 98%.  Filed Weights   08/12/24 0616  Weight: 92.1 kg    Labs & Radiologic Studies  CBC Recent Labs    08/12/24 1343 08/14/24 0327  WBC 10.9* 5.8  HGB 11.0* 10.5*  HCT 36.1 33.2*  MCV 82.4 80.2  PLT 221 216   Basic Metabolic Panel Recent Labs    90/86/74 0224 08/14/24 0327  NA 134* 137  K 4.6 3.8  CL 106 109  CO2 20* 20*  GLUCOSE 126* 115*  BUN 12 12  CREATININE 0.83 0.78  CALCIUM 8.7* 8.3*  MG 2.3 1.9   Liver Function Tests No results for input(s): AST, ALT, ALKPHOS, BILITOT, PROT, ALBUMIN in the last 72 hours. No results for input(s): LIPASE, AMYLASE in the last 72 hours. High Sensitivity Troponin:   No results for input(s): TROPONINIHS in the last 720 hours.  No results for input(s): TRNPT in the last 720 hours.  BNP Invalid input(s): POCBNP No results for input(s): PROBNP in the last 72 hours.  No results for  input(s): BNP in the last 72 hours.  D-Dimer No results for input(s): DDIMER in the last 72 hours. Hemoglobin A1C No results for input(s): HGBA1C in the last 72 hours. Fasting Lipid Panel No results for input(s): CHOL, HDL, LDLCALC, TRIG, CHOLHDL, LDLDIRECT in the last 72 hours. No results found for: LIPOA  Thyroid Function Tests No results for input(s): TSH, T4TOTAL, T3FREE, THYROIDAB in the last 72 hours.  Invalid input(s): FREET3 _____________  ECHOCARDIOGRAM LIMITED Result Date: 08/14/2024    ECHOCARDIOGRAM LIMITED REPORT   Patient Name:   Sarah Holland Date of Exam:  08/14/2024 Medical Rec #:  [id number]        Height:       66.0 in Accession #:    [id number]       Weight:       203.0 lb Date of Birth:  13-Jul-1991        BSA:          2.012 m Patient Age:    32 years         BP:           109/66 mmHg Patient Gender: F                HR:           83 bpm. Exam Location:  Inpatient Procedure: Limited Echo, 2D Echo, Cardiac Doppler and Color Doppler (Both            Spectral and Color Flow Doppler were utilized during procedure). Indications:    Pericardial Effusion  History:        Patient has prior history of Echocardiogram examinations, most                 recent 08/12/2024.  Sonographer:    Philomena Daring Referring Phys: [id number] BENJAMIN J STONER IMPRESSIONS  1. There is no evidence of pericardial effusion.  2. Left ventricular ejection fraction, by estimation, is 60 to 65%. The left ventricle has normal function. The left ventricle has no regional wall motion abnormalities. FINDINGS  Left Ventricle: Left ventricular ejection fraction, by estimation, is 60 to 65%. The left ventricle has normal function. The left ventricle has no regional wall motion abnormalities. The left ventricular internal cavity size was normal in size. Pericardium: There is no evidence of pericardial effusion. Aorta: The aortic root is normal in size and structure. Additional Comments: Spectral Doppler performed. Color Doppler performed.  LEFT VENTRICLE PLAX 2D LVIDd:         4.10 cm   Diastology LVIDs:         2.70 cm   LV e' medial:  9.68 cm/s LV PW:         1.00 cm   LV e' lateral: 13.30 cm/s LV IVS:        1.00 cm LVOT diam:     2.00 cm LVOT Area:     3.14 cm  RIGHT VENTRICLE TAPSE (M-mode): 1.7 cm LEFT ATRIUM         Index LA diam:    2.80 cm 1.39 cm/m   AORTA Ao Root diam: 2.60 cm  SHUNTS Systemic Diam: 2.00 cm Lonni Nanas MD Electronically signed by Lonni Nanas MD Signature Date/Time: 08/14/2024/12:31:39 PM    Final    ECHOCARDIOGRAM LIMITED Result Date: 08/12/2024     ECHOCARDIOGRAM LIMITED REPORT   Patient Name:   Sarah Holland Date of Exam: 08/12/2024 Medical Rec #:  [id number]  Height:       66.0 in Accession #:    [id number]       Weight:       203.0 lb Date of Birth:  08/30/1991        BSA:          2.012 m Patient Age:    32 years         BP:           128/81 mmHg Patient Gender: F                HR:           86 bpm. Exam Location:  Inpatient Procedure: Limited Echo, Cardiac Doppler and Color Doppler (Both Spectral and            Color Flow Doppler were utilized during procedure). Indications:     Chest Pain R07..9  History:         Patient has prior history of Echocardiogram examinations, most                  recent 08/12/2024. Stroke. PFO, Migraines, pericardial effusion.  Sonographer:     Thea Norlander RCS Referring Phys:  [id number] LAMARR SAUNDERS THOMPSON Diagnosing Phys: Lonni Nanas MD IMPRESSIONS  1. There is no evidence of pericardial effusion.  2. s/p PFO closure with 25mm Gore cardioform device  3. Left ventricular ejection fraction, by estimation, is 60 to 65%. The left ventricle has normal function. The left ventricle has no regional wall motion abnormalities.  4. Right ventricular systolic function is normal. The right ventricular size is normal. FINDINGS  Left Ventricle: Left ventricular ejection fraction, by estimation, is 60 to 65%. The left ventricle has normal function. The left ventricle has no regional wall motion abnormalities. The left ventricular internal cavity size was normal in size. There is  no left ventricular hypertrophy. Right Ventricle: The right ventricular size is normal. Right ventricular systolic function is normal. Aortic Valve: Aortic valve peak gradient measures 5.4 mmHg. LEFT VENTRICLE PLAX 2D LVIDd:         4.20 cm   Diastology LVIDs:         2.70 cm   LV e' medial:    9.79 cm/s LV PW:         0.70 cm   LV E/e' medial:  7.8 LV IVS:        0.70 cm   LV e' lateral:   17.60 cm/s LVOT diam:     2.10 cm   LV E/e' lateral: 4.3  LV SV:         57 LV SV Index:   28 LVOT Area:     3.46 cm  RIGHT VENTRICLE RV S prime:     7.05 cm/s TAPSE (M-mode): 1.8 cm LEFT ATRIUM         Index LA diam:    2.40 cm 1.19 cm/m  AORTIC VALVE AV Area (Vmax): 2.58 cm AV Vmax:        116.00 cm/s AV Peak Grad:   5.4 mmHg LVOT Vmax:      86.50 cm/s LVOT Vmean:     58.700 cm/s LVOT VTI:       0.164 m  AORTA Ao Root diam: 2.60 cm Ao Asc diam:  2.70 cm MITRAL VALVE MV Area (PHT): 3.27 cm    SHUNTS MV Decel Time: 232 msec    Systemic VTI:  0.16 m MV E velocity: 76.00 cm/s  Systemic Diam: 2.10  cm MV A velocity: 56.60 cm/s MV E/A ratio:  1.34 Lonni Nanas MD Electronically signed by Lonni Nanas MD Signature Date/Time: 08/12/2024/4:54:54 PM    Final (Updated)    ECHOCARDIOGRAM LIMITED Result Date: 08/12/2024    ECHOCARDIOGRAM LIMITED REPORT   Patient Name:   AMRIT ERCK Date of Exam: 08/12/2024 Medical Rec #:  [id number]        Height:       66.0 in Accession #:    [id number]       Weight:       203.0 lb Date of Birth:  09-09-1991        BSA:          2.012 m Patient Age:    32 years         BP:           82/59 mmHg Patient Gender: F                HR:           116 bpm. Exam Location:  Inpatient Procedure: Echo Guidance/Pericardial Tap, 2D Echo and Cardiac Doppler (Both            Spectral and Color Flow Doppler were utilized during procedure). STAT ECHO Indications:    pericardial effusion  History:        Patient has prior history of Echocardiogram examinations.  Sonographer:    Koleen Popper, RDMS Referring Phys: KATHRYN R THOMPSON IMPRESSIONS  1. Limited echo to guide pericardiocentesis. Following procedure, no effusion seen FINDINGS  Left Ventricle: Left ventricular ejection fraction, by estimation, is 60 to 65%. The left ventricle has normal function. Additional Comments: Spectral Doppler performed.  Lonni Nanas MD Electronically signed by Lonni Nanas MD Signature Date/Time: 08/12/2024/1:09:34 PM    Final    CARDIAC  CATHETERIZATION Result Date: 08/12/2024 1.  Successful pericardial drain placement with evacuation of 375 cc of dark blood.  The opening mean pericardial pressure was 12 mmHg with no respiratory variation and the closing pressure was 6 mmHg with respiratory variation. Summary: Successful treatment of pericardial tamponade complicating PFO closure.  Case was reviewed with Dr. Zenaida the [facility] attending.  I reviewed the results of the case with the patient's mother and grandmother in person.  Hold Plavix  until drain is out then start Plavix  75 mg without a load.   ECHOCARDIOGRAM LIMITED Result Date: 08/12/2024    ECHOCARDIOGRAM LIMITED REPORT   Patient Name:   Sarah Holland Date of Exam: 08/12/2024 Medical Rec #:  [id number]        Height:       66.0 in Accession #:    [id number]       Weight:       203.0 lb Date of Birth:  [birth year]        BSA:          2.012 m Patient Age:    32 years         BP:           109/82 mmHg Patient Gender: F                HR:           79 bpm. Exam Location:  Inpatient Procedure: 2D Echo, Cardiac Doppler and Color Doppler (Both Spectral and Color            Flow Doppler were utilized during procedure). Indications:    pericardial effusion s/p PFO closure  History:  Patient has prior history of Echocardiogram examinations, most                 recent 06/07/2024.  Sonographer:    Therisa Crouch Referring Phys: [id number] ARUN K THUKKANI IMPRESSIONS  1. Left ventricular ejection fraction, by estimation, is 55 to 60%. The left ventricle has normal function.  2. Right ventricular systolic function is normal. The right ventricular size is normal.  3. S/p PFO closure with 25mm Gore cardioform septal occluder. No residual shunt seen  4. A small pericardial effusion is present. FINDINGS  Left Ventricle: Left ventricular ejection fraction, by estimation, is 55 to 60%. The left ventricle has normal function. Right Ventricle: The right ventricular size is normal. No increase in right ventricular  wall thickness. Right ventricular systolic function is normal. Pericardium: A small pericardial effusion is present. IVC IVC diam: 2.20 cm Lonni Nanas MD Electronically signed by Lonni Nanas MD Signature Date/Time: 08/12/2024/12:03:36 PM    Final    CARDIAC CATHETERIZATION Addendum Date: 08/12/2024 1.  Successful ICE-guided PFO closure with 25 mm Gore cardio form device. Summary: 4-hour bedrest, limited echo, Plavix  load 300 mg later today and dual antiplatelet therapy with Plavix  and aspirin  for 6 months. ADDENDUM:  Post procedural limited echo demonstrated a small pericardial effusion.  Patient hemodynamically stable.  Will hold plavix  for now, admit to 2H for observation with repeat TTE later today.  Discussed with Dr. Zenaida, the [facility] attending, patient, and patient's mother.  Result Date: 08/12/2024 1.  Successful ICE-guided PFO closure with 25 mm Gore cardio form device. Summary: 4-hour bedrest, limited echo, Plavix  load 300 mg later today and dual antiplatelet therapy with Plavix  and aspirin  for 6 months.   LONG TERM MONITOR (3-14 DAYS) Result Date: 07/21/2024 Patch Wear Time:  7 days and 0 hours (2025-07-28T09:53:56-0400 to 2025-08-04T10:43:28-0400) Patient had a min HR of 60 bpm, max HR of 173 bpm, and avg HR of 100 bpm. Predominant underlying rhythm was Sinus Rhythm. EVENTS: -Isolated SVEs were rare (<1.0%), and no SVE Couplets or SVE Triplets were present. -Isolated VEs were rare (<1.0%), and no VE Couplets or VE Triplets were present. -No atrial fibrillation, sustained ventricular tachyarrhythmias, or bradyarrhythmias were detected. - No patient triggered events Summary: Reassuring monitor    Disposition Pt is being discharged home today in good condition.  Follow-up Plans & Appointments    Discharge Medications Allergies as of 08/14/2024   No Known Allergies      Medication List     TAKE these medications    acetaminophen  325 MG tablet Commonly known as:  TYLENOL  Take 2 tablets (650 mg total) by mouth every 4 (four) hours as needed for headache or mild pain (pain score 1-3).   aspirin  500 MG tablet Take 1.5 tablets (750 mg total) by mouth 3 (three) times daily. What changed: You were already taking a medication with the same name, and this prescription was added. Make sure you understand how and when to take each.   aspirin  EC 81 MG tablet Take 1 tablet (81 mg total) by mouth daily. Take 1 tablet (81 mg total) by mouth daily starting the next day after completing high dose Aspirin . Start taking on: August 24, 2024 What changed:  additional instructions These instructions start on August 24, 2024. If you are unsure what to do until then, ask your doctor or other care provider.   clopidogrel  75 MG tablet Commonly known as: PLAVIX  Take 1 tablet (75 mg total) by mouth daily. Start taking on:  August 15, 2024   colchicine  0.6 MG tablet Take 1 tablet (0.6 mg total) by mouth 2 (two) times daily.   pantoprazole  40 MG tablet Commonly known as: PROTONIX  Take 1 tablet (40 mg total) by mouth daily. Take 1 tablet (40 mg) every morning on an empty stomach while taking high dose Aspirin . Start taking on: August 15, 2024         Outstanding Labs/Studies   Duration of Discharge Encounter: APP Time: 25 minutes   Signed, Artist Pouch, PA-C 08/14/2024, 1:02 PM

## 2024-08-14 NOTE — Plan of Care (Signed)
  Problem: Activity: Goal: Risk for activity intolerance will decrease Outcome: Progressing   Problem: Nutrition: Goal: Adequate nutrition will be maintained Outcome: Progressing   Problem: Coping: Goal: Level of anxiety will decrease Outcome: Progressing   Problem: Activity: Goal: Ability to return to baseline activity level will improve Outcome: Progressing   Problem: Cardiovascular: Goal: Ability to achieve and maintain adequate cardiovascular perfusion will improve Outcome: Progressing

## 2024-08-14 NOTE — Progress Notes (Signed)
  Progress Note  Patient Name: Sarah Holland Date of Encounter: 08/14/2024 [facility] Cardiologist: Wendel  Interval Summary   POD 2 s/p PFO closure c/b by effusion >> drain >> resolution of tamponade  Started on plavix  and high dose ASA yesterday.  Ambulated yesterday, mild pleuritic chest pain which is improved.  Vital Signs Vitals:   08/14/24 0400 08/14/24 0500 08/14/24 0600 08/14/24 0700  BP: (!) 97/57 97/60 101/69 110/79  Pulse: 89 85 88 88  Resp: (!) 23 16 18 19   Temp:      TempSrc:      SpO2: 97% 97% 97% 95%  Weight:      Height:        Intake/Output Summary (Last 24 hours) at 08/14/2024 0803 Last data filed at 08/14/2024 0000 Gross per 24 hour  Intake --  Output 810 ml  Net -810 ml      08/12/2024    6:16 AM 08/04/2024   11:14 AM 07/19/2024   10:56 AM  Last 3 Weights  Weight (lbs) 203 lb 203 lb 6.4 oz 210 lb  Weight (kg) 92.08 kg 92.262 kg 95.255 kg      Telemetry/ECG  Sinus tachycardia - Personally Reviewed  Physical Exam  GEN: No acute distress.   Neck: No JVD Cardiac:  RRR, no murmurs, rubs, or gallops; drain in place Respiratory: Clear to auscultation bilaterally. GI: Soft, nontender, non-distended  MS: No edema  Assessment & Plan  1.  Tamponade: No measurable output from drain.   Continue colchicine  0.6 twice daily x 2 months, Plavix  75 mg x 6 months, high dose aspirin  750mg  TID x 10 days then ASA 81mg  indefinitely, pantoprazole  40 while on high dose ASA.  Quick look TTE today and if reassuring then D/C drain.  If course uncomplicated and ambulating, patient can be d/c'd home and will arrange follow up with me in 1 week.  2.  PFO s/p closure: Continue ASA as above and plavix  75mg   3.  Alpha thalassemia: Continue to monitor    For questions or updates, please contact [facility] Please consult [email or web address] for contact info under        CRITICAL CARE TIME Performed by: Coyt Govoni K Marino Rogerson, MD   Total critical care time:  30 minutes  Critical care time was exclusive of separately billable procedures and treating other patients.  Critical care was necessary to treat or prevent imminent or life-threatening deterioration.  Critical care was time spent personally by me on the following activities: development of treatment plan with patient and/or surrogate as well as nursing, discussions with consultants, evaluation of patient's response to treatment, examination of patient, obtaining history from patient or surrogate, ordering and performing treatments and interventions, ordering and review of laboratory studies, ordering and review of radiographic studies, pulse oximetry and re-evaluation of patient's condition. Signed, Anahita Cua K Airi Copado, MD

## 2024-08-14 NOTE — Progress Notes (Signed)
   08/14/24 1416  Discharge Planning  Type of Residence Private residence  Living Arrangements Spouse/significant other;Children  Home Care Services No  Support Systems Spouse/significant other;Children;Parent;Other relatives;Friends/neighbors  Does the patient have any problems obtaining your medications? No  Does the patient have difficulty doing errands alone such as visiting a doctor's office or shopping? N  Family/patient expects to be discharged to: Private residence  Once discharged, how will the patient get to their discharge location? Family/Friend - Partnered Transport  Has discharge transport plan been identified? Yes  Once discharged, how will the patient get to their follow-up appointment? Self/Private Vehicle;Family/Friend - Partnered Transport  Expected Discharge Date 08/14/24

## 2024-08-15 ENCOUNTER — Encounter (HOSPITAL_COMMUNITY): Payer: Self-pay | Admitting: Internal Medicine

## 2024-08-15 LAB — LIPOPROTEIN A (LPA): Lipoprotein (a): 46.5 nmol/L — ABNORMAL HIGH (ref <75.0)

## 2024-08-16 ENCOUNTER — Telehealth: Payer: Self-pay | Admitting: Physician Assistant

## 2024-08-16 ENCOUNTER — Encounter: Payer: Self-pay | Admitting: Internal Medicine

## 2024-08-16 NOTE — Telephone Encounter (Signed)
  HEART AND VASCULAR CENTER   MULTIDISCIPLINARY HEART VALVE TEAM   Patient contacted regarding discharge from [facility] on 9/14  Patient understands to follow up with Dr. Wendel on 08/18/24 at [facility]. Patient understands discharge instructions? yes Patient understands medications and regimen? yes Patient understands to bring all medications to this visit? Yes  Pt having a lot of GI upset likely 2/2 colchicine . I have asked her to decrease dose from twice a day to daily. Also having some SOB and lightheadedness. Will discuss with Dr. Wendel at visit on Thursday.   Lamarr Hummer PA-C  [facility]

## 2024-08-17 ENCOUNTER — Telehealth: Payer: Self-pay | Admitting: Physician Assistant

## 2024-08-17 NOTE — Telephone Encounter (Signed)
  HEART AND VASCULAR CENTER   MULTIDISCIPLINARY HEART VALVE TEAM   I called Sarah Holland to follow up and she reports and improvement in her GI symptoms with dose reduction of colchicine  but still having some diarrhea. SOB improved, but still notes some with exertion. She is at home with 3 children and thinks she was trying to do too much. She is doing her best to take it easy. She will see Dr. Wendel tomorrow in the office at [facility] for follow up.   Lamarr Hummer PA-C  [facility]

## 2024-08-18 ENCOUNTER — Encounter: Payer: Self-pay | Admitting: Internal Medicine

## 2024-08-18 ENCOUNTER — Ambulatory Visit: Attending: Internal Medicine | Admitting: Internal Medicine

## 2024-08-18 VITALS — BP 130/90 | HR 82 | Ht 66.0 in | Wt 204.6 lb

## 2024-08-18 DIAGNOSIS — I639 Cerebral infarction, unspecified: Secondary | ICD-10-CM | POA: Diagnosis not present

## 2024-08-18 DIAGNOSIS — Q2112 Patent foramen ovale: Secondary | ICD-10-CM

## 2024-08-18 DIAGNOSIS — I314 Cardiac tamponade: Secondary | ICD-10-CM

## 2024-08-18 DIAGNOSIS — R0600 Dyspnea, unspecified: Secondary | ICD-10-CM

## 2024-08-18 DIAGNOSIS — D560 Alpha thalassemia: Secondary | ICD-10-CM

## 2024-08-18 DIAGNOSIS — I3139 Other pericardial effusion (noninflammatory): Secondary | ICD-10-CM

## 2024-08-18 NOTE — Patient Instructions (Signed)
 Medication Instructions:  No medication changes were made at this visit. Continue current regimen.   *If you need a refill on your cardiac medications before your next appointment, please call your pharmacy*  Lab Work: To be completed today: CBC  If you have labs (blood work) drawn today and your tests are completely normal, you will receive your results only by: MyChart Message (if you have MyChart) OR A paper copy in the mail If you have any lab test that is abnormal or we need to change your treatment, we will call you to review the results.  Testing/Procedures: Your physician has requested that you have an echocardiogram at next available appointment slot. Echocardiography is a painless test that uses sound waves to create images of your heart. It provides your doctor with information about the size and shape of your heart and how well your heart's chambers and valves are working. This procedure takes approximately one hour. There are no restrictions for this procedure. Please do NOT wear cologne, perfume, aftershave, or lotions (deodorant is allowed). Please arrive 15 minutes prior to your appointment time.  Please note: We ask at that you not bring children with you during ultrasound (echo/ vascular) testing. Due to room size and safety concerns, children are not allowed in the ultrasound rooms during exams. Our front office staff cannot provide observation of children in our lobby area while testing is being conducted. An adult accompanying a patient to their appointment will only be allowed in the ultrasound room at the discretion of the ultrasound technician under special circumstances. We apologize for any inconvenience.   Follow-Up: At [facility], you and your health needs are our priority.  As part of our continuing mission to provide you with exceptional heart care, our providers are all part of one team.  This team includes your primary Cardiologist (physician) and  Advanced Practice Providers or APPs (Physician Assistants and Nurse Practitioners) who all work together to provide you with the care you need, when you need it.  Your next appointment:   09/12/24 at 11:10 AM  Provider:   Izetta Hummer, PA-C

## 2024-08-19 ENCOUNTER — Ambulatory Visit: Payer: Self-pay | Admitting: Internal Medicine

## 2024-08-19 ENCOUNTER — Ambulatory Visit (HOSPITAL_COMMUNITY)
Admission: RE | Admit: 2024-08-19 | Discharge: 2024-08-19 | Disposition: A | Discharge status: Home / Self Care | Source: Ambulatory Visit | Attending: Internal Medicine | Admitting: Internal Medicine

## 2024-08-19 DIAGNOSIS — Q2112 Patent foramen ovale: Secondary | ICD-10-CM | POA: Diagnosis present

## 2024-08-19 LAB — CBC
Hematocrit: 35.2 % (ref 34.0–46.6)
Hemoglobin: 10.8 g/dL — ABNORMAL LOW (ref 11.1–15.9)
MCH: 25.1 pg — ABNORMAL LOW (ref 26.6–33.0)
MCHC: 30.7 g/dL — ABNORMAL LOW (ref 31.5–35.7)
MCV: 82 fL (ref 79–97)
Platelets: 290 x10E3/uL (ref 150–450)
RBC: 4.31 x10E6/uL (ref 3.77–5.28)
RDW: 13 % (ref 11.7–15.4)
WBC: 4.6 x10E3/uL (ref 3.4–10.8)

## 2024-08-19 LAB — ECHOCARDIOGRAM LIMITED
Area-P 1/2: 3.48 cm2
S' Lateral: 3.01 cm

## 2024-08-31 ENCOUNTER — Ambulatory Visit: Admitting: Internal Medicine

## 2024-09-05 ENCOUNTER — Other Ambulatory Visit: Payer: Self-pay | Admitting: Cardiology

## 2024-09-07 NOTE — Telephone Encounter (Signed)
 Is [name] ok w Refilling this or needs to go to pcp

## 2024-09-12 ENCOUNTER — Ambulatory Visit: Attending: Physician Assistant | Admitting: Physician Assistant

## 2024-09-12 ENCOUNTER — Ambulatory Visit

## 2024-09-12 VITALS — BP 118/80 | HR 95 | Ht 66.0 in | Wt 209.0 lb

## 2024-09-12 DIAGNOSIS — Z8673 Personal history of transient ischemic attack (TIA), and cerebral infarction without residual deficits: Secondary | ICD-10-CM

## 2024-09-12 DIAGNOSIS — R002 Palpitations: Secondary | ICD-10-CM

## 2024-09-12 DIAGNOSIS — Z8774 Personal history of (corrected) congenital malformations of heart and circulatory system: Secondary | ICD-10-CM

## 2024-09-12 DIAGNOSIS — D560 Alpha thalassemia: Secondary | ICD-10-CM | POA: Diagnosis not present

## 2024-09-12 MED ORDER — AMOXICILLIN 500 MG PO TABS
2000.0000 mg | ORAL_TABLET | ORAL | 0 refills | Status: AC
Start: 2024-09-12 — End: ?

## 2024-09-12 MED ORDER — COLCHICINE 0.6 MG PO TABS: 0.6000 mg | ORAL_TABLET | Freq: Every day | ORAL | Status: AC

## 2024-09-12 NOTE — Progress Notes (Unsigned)
 ZIO XT serial # [id number] from office inventory applied to patient.  Dr. Wendel to read.

## 2024-09-12 NOTE — Progress Notes (Signed)
 HEART AND VASCULAR CENTER   MULTIDISCIPLINARY HEART VALVE CLINIC                                     Cardiology Office Note:    Date:  09/12/2024   ID:  [name], DOB Nov 10, 1991, MRN [id number]  PCP:  Sherial Bail, MD  [facility] Cardiologist:  None  [facility] Structural heart: Arun K Thukkani, MD [facility] Electrophysiologist:  None   Referring MD: Sherial Bail, MD   1 month s/p PFO closure   History of Present Illness:    Sarah Holland is a 33 y.o. female with a hx of CVA, alpha thalassemia and PFO s/p PFO closure (08/12/24) c/b cardiac tamponade s/p pericardiocentesis (08/12/24) who presents to clinic for follow up.  Status post PFO closure with 25 mm Gore cardio form device by Dr. Wendel on 08/12/24. She was later found with large pericardial effusion with tamponade. Bedside pericardiocentesis attempted but could not get access so patient taken to the cath lab for drain placement. Patient significantly improved on 9/13 with minimal drain output. She was started on Colchicine . Repeat echo without residual effusion. She was started on high dose ASA and plavix . Aspirin  de-escalated to 81mg  daily after 7 days. Colchicine  reduced due to GI upset. At follow up she had some residual SOB and CBC showed Hg 10.8 and limited echo with EF 60-65% with no effusion.   Today the patient presents to clinic for follow up. Here alone. No CP or SOB. No LE edema, orthopnea or PND. No dizziness or syncope. No blood in stool or urine. She does have an occasional fluttering in her chest. GI sx imprved on lower dose colchicine .      Past Medical History:  Diagnosis Date   Elevated blood pressure complicating pregnancy in third trimester, antepartum 09/27/2019   Encounter for induction of labor 09/28/2019   Family history of pancreatic cancer    8/23 cancer genetic testing letter sent   Gestational diabetes    Gestational hypertension 09/28/2019   Obesity (BMI  30-39.9)    Obesity complicating peripregnancy, antepartum 08/09/2019   Stroke (HCC) 06/07/2024   Supervision of high risk pregnancy, antepartum 04/05/2019   Clinic [facility] Prenatal Labs Dating LMP Blood type: B/Positive/-- (04/22 1503)  Genetic Screen NIPS: Negative XY Antibody:Negative (04/22 1503) Anatomic US  Complete 05/16/2019 Rubella: 3.96 (04/22 1503) Varicella: Immune GTT Early:               Third trimester: 204; 3 hr 98/219/204/140- RPR: Non Reactive (04/22 1503)  Rhogam N/A HBsAg: Negative (04/22 1503)  TDaP vaccine        08/02/19                 Current Medications: Current Meds  Medication Sig   amoxicillin  (AMOXIL ) 500 MG tablet Take 4 tablets (2,000 mg total) by mouth as directed. 1 hour prior to dental work including cleanings   aspirin  EC 81 MG tablet Take 1 tablet (81 mg total) by mouth daily. Take 1 tablet (81 mg total) by mouth daily starting the next day after completing high dose Aspirin .   clopidogrel  (PLAVIX ) 75 MG tablet Take 1 tablet (75 mg total) by mouth daily.   [DISCONTINUED] colchicine  0.6 MG tablet Take 1 tablet (0.6 mg total) by mouth 2 (two) times daily.      ROS:   Please see the history of present  illness.    All other systems reviewed and are negative.  EKGs   EKG Interpretation Date/Time:  Monday September 12 2024 11:15:08 EDT Ventricular Rate:  93 PR Interval:  154 QRS Duration:  70 QT Interval:  348 QTC Calculation: 432 R Axis:   35  Text Interpretation: Normal sinus rhythm Normal ECG When compared with ECG of 12-Aug-2024 06:28, No significant change was found Confirmed by Sebastian Collar [phone]) on 09/12/2024 11:17:14 AM   Risk Assessment/Calculations:            Physical Exam:    VS:  BP 118/80   Pulse 95   Ht 5' 6 (1.676 m)   Wt 209 lb (94.8 kg)   SpO2 98%   BMI 33.73 kg/m     Wt Readings from Last 3 Encounters:  09/12/24 209 lb (94.8 kg)  08/18/24 204 lb 9.6 oz (92.8 kg)  08/12/24 203 lb (92.1 kg)     GEN: Well  nourished, well developed in no acute distress NECK: No JVD CARDIAC: RRR, no murmurs, rubs, gallops RESPIRATORY:  Clear to auscultation without rales, wheezing or rhonchi  ABDOMEN: Soft, non-tender, non-distended EXTREMITIES:  No edema; No deformity.  ASSESSMENT:    1. S/P patent foramen ovale closure   2. Alpha-thalassemia   3. History of CVA (cerebrovascular accident)   4. Palpitations     PLAN:    In order of problems listed above:  S/p PFO closure c/b cardiac tamponade s/p pericardiocentesis:  -- Limited echo 08/19/24 with EF 60-65% with no effusion.  -- SBE x6 months discussed. Amoxicillin  called in. -- Continue Aspirin  81mg  daily and Plavix  75mg  daily x6 months followed by aspirin  81mg  daily alone.  -- Stop Plavix  02/09/25.  -- Continue colchicine  0.6mg  daily x 2 months (end date 10/19/24). -- I will see back for 1 year echo/bubble and OV.  Alpha thalassemia: -- Hg 10.8 on most recent labs 08/18/24 (personally reviewed). -- Followed by hematology.   Cryptogenic CVA: -- Continue aspirin  and now s/p PFO closure.   Palpitations: -- HRs 90-100s in office today.  -- ECG with NSR. -- Will place 14 day Zio to rule out post PFO closure PAF.   Medication Adjustments/Labs and Tests Ordered: Current medicines are reviewed at length with the patient today.  Concerns regarding medicines are outlined above.  Orders Placed This Encounter  Procedures   LONG TERM MONITOR XT (3-14 DAYS)   EKG 12-Lead   ECHOCARDIOGRAM COMPLETE BUBBLE STUDY   Meds ordered this encounter  Medications   amoxicillin  (AMOXIL ) 500 MG tablet    Sig: Take 4 tablets (2,000 mg total) by mouth as directed. 1 hour prior to dental work including cleanings    Dispense:  12 tablet    Refill:  0    Supervising Provider:   COOPER, MICHAEL [[id number]]   colchicine  0.6 MG tablet    Sig: Take 1 tablet (0.6 mg total) by mouth daily.    Patient Instructions  Medication Instructions:  Your physician has recommended  you make the following change in your medication: Start Amoxicillin  500 mg, take 4 tablets by mouth 1 hour prior to dental procedures and cleanings.    STOP Plavix  on 02/09/25. Take Colchicine  once daily until 10/19/24, then STOP.  *If you need a refill on your cardiac medications before your next appointment, please call your pharmacy*  Lab Work: None needed If you have labs (blood work) drawn today and your tests are completely normal, you will receive your results only by:  MyChart Message (if you have MyChart) OR A paper copy in the mail If you have any lab test that is abnormal or we need to change your treatment, we will call you to review the results.  Testing/Procedures: Your physician has requested that you wear a Zio heart monitor for 14 days. This will be applied in office with instructions on how to apply the monitor and how to return it when finished. Please allow 2 weeks after returning the heart monitor before our office calls you with the results.   Follow-Up: At [facility], you and your health needs are our priority.  As part of our continuing mission to provide you with exceptional heart care, our providers are all part of one team.  This team includes your primary Cardiologist (physician) and Advanced Practice Providers or APPs (Physician Assistants and Nurse Practitioners) who all work together to provide you with the care you need, when you need it.  Your next appointment:   As scheduled on 08/17/25  Provider:   Izetta Hummer, PA-C    We recommend signing up for the patient portal called MyChart.  Sign up information is provided on this After Visit Summary.  MyChart is used to connect with patients for Virtual Visits (Telemedicine).  Patients are able to view lab/test results, encounter notes, upcoming appointments, etc.  Non-urgent messages can be sent to your provider as well.   To learn more about what you can do with MyChart, go to  [email or web address].            Signed, Lamarr Hummer, PA-C  09/12/2024 1:05 PM    [facility]

## 2024-09-12 NOTE — Patient Instructions (Signed)
 Medication Instructions:  Your physician has recommended you make the following change in your medication: Start Amoxicillin  500 mg, take 4 tablets by mouth 1 hour prior to dental procedures and cleanings.    STOP Plavix  on 02/09/25. Take Colchicine  once daily until 10/19/24, then STOP.  *If you need a refill on your cardiac medications before your next appointment, please call your pharmacy*  Lab Work: None needed If you have labs (blood work) drawn today and your tests are completely normal, you will receive your results only by: MyChart Message (if you have MyChart) OR A paper copy in the mail If you have any lab test that is abnormal or we need to change your treatment, we will call you to review the results.  Testing/Procedures: Your physician has requested that you wear a Zio heart monitor for 14 days. This will be applied in office with instructions on how to apply the monitor and how to return it when finished. Please allow 2 weeks after returning the heart monitor before our office calls you with the results.   Follow-Up: At [facility], you and your health needs are our priority.  As part of our continuing mission to provide you with exceptional heart care, our providers are all part of one team.  This team includes your primary Cardiologist (physician) and Advanced Practice Providers or APPs (Physician Assistants and Nurse Practitioners) who all work together to provide you with the care you need, when you need it.  Your next appointment:   As scheduled on 08/17/25  Provider:   Izetta Hummer, PA-C    We recommend signing up for the patient portal called MyChart.  Sign up information is provided on this After Visit Summary.  MyChart is used to connect with patients for Virtual Visits (Telemedicine).  Patients are able to view lab/test results, encounter notes, upcoming appointments, etc.  Non-urgent messages can be sent to your provider as well.   To learn more about  what you can do with MyChart, go to [email or web address].

## 2024-09-13 ENCOUNTER — Telehealth: Payer: Self-pay

## 2024-09-13 ENCOUNTER — Inpatient Hospital Stay: Attending: Hematology and Oncology | Admitting: Hematology and Oncology

## 2024-09-13 ENCOUNTER — Encounter: Payer: Self-pay | Admitting: Hematology and Oncology

## 2024-09-13 VITALS — BP 137/90 | HR 111 | Temp 99.2°F | Resp 18 | Ht 66.0 in | Wt 205.0 lb

## 2024-09-13 DIAGNOSIS — I639 Cerebral infarction, unspecified: Secondary | ICD-10-CM | POA: Insufficient documentation

## 2024-09-13 NOTE — Progress Notes (Signed)
 [facility] OFFICE PROGRESS NOTE  Patient Care Team: Sherial Bail, MD as PCP - General (Internal Medicine) Wendel Lurena POUR, MD as PCP - Structural Heart (Cardiology)  Assessment & Plan Acute cerebrovascular accident (CVA) [facility]) Her recent acute stroke was likely caused by presence of patent foramina ovale, on background history of vaping along with hormonal manipulation for contraception We discussed avoiding hormonal contraception in the future; she is currently not sexually active and is not on any form of contraceptives The patient is instructed to stop vaping, she will attempt her best effort Patent foreman ovale has been closed recently in September  She will continue aspirin  and Plavix  for now I told the patient as long as she continues vaping, she will still be at risk of recurrent thrombosis She does not need long-term follow-up She can stop Plavix  once she stop vaping but would be a good idea to consider long-term 81 mg aspirin  therapy for secondary prevention   No orders of the defined types were placed in this encounter.    Almarie Bedford, MD  INTERVAL HISTORY: she returns for surveillance follow-up for history of stroke She is recovering nicely with mild residual weakness in her right hand She is able to return back to work Unfortunately, she continues her vaping habits She underwent closure of patent foreman ovale in September  PHYSICAL EXAMINATION: ECOG PERFORMANCE STATUS: 0 - Asymptomatic  Vitals:   09/13/24 0924  BP: (!) 137/90  Pulse: (!) 111  Resp: 18  Temp: 99.2 F (37.3 C)  SpO2: 100%   Filed Weights   09/13/24 0924  Weight: 205 lb (93 kg)    Relevant data reviewed during this visit included recent labs and procedure note from September

## 2024-09-13 NOTE — Telephone Encounter (Signed)
-----   Message from [employer] sent at 09/13/2024 10:15 AM EDT ----- Pls call her I saw the result wrong, the Lp(a) was flagged but the result is actually normal Let her know she does not need to worry about it

## 2024-09-13 NOTE — Telephone Encounter (Signed)
Called and given below message. She verbalized understanding and appreciated the call.

## 2024-09-13 NOTE — Assessment & Plan Note (Addendum)
 Her recent acute stroke was likely caused by presence of patent foramina ovale, on background history of vaping along with hormonal manipulation for contraception We discussed avoiding hormonal contraception in the future; she is currently not sexually active and is not on any form of contraceptives The patient is instructed to stop vaping, she will attempt her best effort Patent foreman ovale has been closed recently in September  She will continue aspirin  and Plavix  for now I told the patient as long as she continues vaping, she will still be at risk of recurrent thrombosis She does not need long-term follow-up She can stop Plavix  once she stop vaping but would be a good idea to consider long-term 81 mg aspirin  therapy for secondary prevention

## 2024-09-26 ENCOUNTER — Other Ambulatory Visit (HOSPITAL_COMMUNITY)

## 2024-11-14 ENCOUNTER — Encounter: Payer: Self-pay | Admitting: Adult Health

## 2024-11-14 ENCOUNTER — Ambulatory Visit: Admitting: Adult Health

## 2024-11-14 NOTE — Progress Notes (Deleted)
 [facility] [address] [place] [address] [phone]       STROKE FOLLOW UP NOTE  [name] Date of Birth:  [birth year] Medical Record Number:  [id number]   Reason for visit: Stroke follow up    SUBJECTIVE:   CHIEF COMPLAINT:  No chief complaint on file.   HPI:    Update 11/14/2024 JM: Patient returns for 53-month stroke follow-up.  She underwent PFO closure in 08/2024 c/b cardiac tamponade s/p attempted pericardiocentesis but ultimately drain was placed with improvement and started on colchicine .  She remains on both aspirin  and Plavix  with plans on continuing Plavix  until 01/2025.  She was also evaluated by oncology in 08/2024 who noted stroke likely caused by presence of PFO with background history of vaping along with hormonal manipulation for contraception.  Remains off hormonal contraception and discussed importance of vaping cessation.  Advised to follow-up as needed.      History provided for reference purposes only Initial visit 07/13/2024 JM: Patient is being seen for initial hospital follow-up unaccompanied.  Reports overall doing well since discharge.  She does report occasional episodes of lightheadedness and occasionally feeling off balance but has been gradually improving.  She denies any residual weakness or speech difficulty.  She has been experiencing more frequent migraine headaches, last week had migraine that lasted for 3 days.  She was previously on sumatriptan  for rescue but was advised recently to not take.  She is not currently on any preventative treatment as migraines previously occurred rarely.  No new stroke/TIA symptoms.  She remains on both aspirin  and Plavix  without side effects.  Evaluated by cardiology, advised to complete 1 week cardiac monitor to evaluate for A-fib and if negative, she will be referred for PFO closure. Completed cardiac monitor, has f/u visit with cards next week to review. She was advised to  discuss ongoing need of Plavix  during todays visit.  She routinely follows with PCP.  She was seen by hematology and diagnosed with alpha thalassemia, she has f/u visit in October.  Stroke admission 06/07/2024 [name] is a 33 y.o. female with history of migraine, gestational diabetes and hypertension who presented to [facility] ED on 06/07/2024 with right-sided weakness and slurred speech with headache developed after.  CT head and MRI brain no acute abnormality.  CTA head/neck showed left M3 occlusion.  TCD bubble study Spencer degree 3 with Valsalva.  2D echo EF 60 to 65% with large PFO.  TEE large PFO tunneled, very positive bubble study especially with Valsalva.  LE Doppler negative for DVT.  LDL 77.  A1c 5.5.  Hypercoag workup showed low level of protein C and S.  Suspected small left MCA stroke not detected on MRI, embolic pattern, likely related to PFO.  She was placed on DAPT and she was referred to cardiology for PFO closure.  Advised follow-up outpatient with hematology for protein C and S deficiency.     PERTINENT IMAGING  CT no acute abnormality CTA head and neck left M3 occlusion MRI no acute abnormality TCD bubble study Spencer degree 3 with Valsalva 2D Echo EF 60 to 65%, evidence of large PFO TEE large PFO tunneled, very positive bubble study especially with valsalva LE venous Doppler no DVT LDL 77 HgbA1c 5.5 UDS negative Hypercoag workup low level of protein C and S    ROS:   14 system review of systems performed and negative with exception of those listed in HPI  PMH:  Past Medical  History:  Diagnosis Date   Elevated blood pressure complicating pregnancy in third trimester, antepartum 09/27/2019   Encounter for induction of labor 09/28/2019   Family history of pancreatic cancer    8/23 cancer genetic testing letter sent   Gestational diabetes    Gestational hypertension 09/28/2019   Obesity (BMI 30-39.9)    Obesity complicating peripregnancy, antepartum  08/09/2019   Stroke (HCC) 06/07/2024   Supervision of high risk pregnancy, antepartum 04/05/2019   Clinic [facility] Prenatal Labs Dating LMP Blood type: B/Positive/-- (04/22 1503)  Genetic Screen NIPS: Negative XY Antibody:Negative (04/22 1503) Anatomic US  Complete 05/16/2019 Rubella: 3.96 (04/22 1503) Varicella: Immune GTT Early:               Third trimester: 204; 3 hr 98/219/204/140- RPR: Non Reactive (04/22 1503)  Rhogam N/A HBsAg: Negative (04/22 1503)  TDaP vaccine        08/02/19                PSH:  Past Surgical History:  Procedure Laterality Date   APPENDECTOMY     CORONARY IMAGING/OCT N/A 08/12/2024   Procedure: CORONARY IMAGING/OCT;  Surgeon: Wendel Lurena POUR, MD;  Location: [facility] INVASIVE CV LAB;  Service: Cardiovascular;  Laterality: N/A;   PATENT FORAMEN OVALE(PFO) CLOSURE N/A 08/12/2024   Procedure: PATENT FORAMEN OVALE(PFO) CLOSURE;  Surgeon: Wendel Lurena POUR, MD;  Location: [facility] INVASIVE CV LAB;  Service: Cardiovascular;  Laterality: N/A;   PERICARDIOCENTESIS N/A 08/12/2024   Procedure: PERICARDIOCENTESIS;  Surgeon: Wendel Lurena POUR, MD;  Location: [facility] INVASIVE CV LAB;  Service: Cardiovascular;  Laterality: N/A;   TRANSESOPHAGEAL ECHOCARDIOGRAM (CATH LAB) N/A 06/09/2024   Procedure: TRANSESOPHAGEAL ECHOCARDIOGRAM;  Surgeon: Delford Maude BROCKS, MD;  Location: [facility] INVASIVE CV LAB;  Service: Cardiovascular;  Laterality: N/A;    Social History:  Social History   Socioeconomic History   Marital status: Married    Spouse name: Not on file   Number of children: 3   Years of education: Not on file   Highest education level: Not on file  Occupational History   Occupation: [employer]  Tobacco Use   Smoking status: Former    Current packs/day: 0.00    Average packs/day: 0.3 packs/day for 3.0 years (0.8 ttl pk-yrs)    Types: Cigarettes    Start date: 12/01/2014    Quit date: 12/01/2017    Years since quitting: 6.9   Smokeless tobacco: Never  Vaping Use   Vaping status: Some Days   Substances:  Nicotine  Substance and Sexual Activity   Alcohol use: No   Drug use: Never   Sexual activity: Not Currently    Birth control/protection: Injection  Other Topics Concern   Not on file  Social History Narrative   Not on file   Social Drivers of Health   Tobacco Use: Medium Risk (09/26/2024)   Received from [facility]   Patient History    Smoking Tobacco Use: Former    Smokeless Tobacco Use: Never    Passive Exposure: Past  Physicist, Medical Strain: Low Risk  (09/26/2024)   Received from [facility]   Overall Financial Resource Strain (CARDIA)    Difficulty of Paying Living Expenses: Not hard at all  Food Insecurity: No Food Insecurity (09/26/2024)   Received from [facility]   Epic    Within the past 12 months, you worried that your food would run out before you got the money to buy more.: Never true  Within the past 12 months, the food you bought just didn't last and you didn't have money to get more.: Never true  Transportation Needs: No Transportation Needs (09/26/2024)   Received from [facility] - Transportation    In the past 12 months, has lack of transportation kept you from medical appointments or from getting medications?: No    Lack of Transportation (Non-Medical): No  Physical Activity: Not on file  Stress: Not on file  Social Connections: Not on file  Intimate Partner Violence: Not At Risk (08/13/2024)   Epic    Fear of Current or Ex-Partner: No    Emotionally Abused: No    Physically Abused: No    Sexually Abused: No  Depression (PHQ2-9): Low Risk (05/01/2022)   Depression (PHQ2-9)    PHQ-2 Score: 2  Alcohol Screen: Not on file  Housing: Low Risk  (09/26/2024)   Received from [facility]   Epic    In the last 12 months, was there a time when you were not able to pay the mortgage or rent on time?: No    In the past 12 months, how many times have you moved  where you were living?: 0    At any time in the past 12 months, were you homeless or living in a shelter (including now)?: No  Utilities: Not At Risk (09/26/2024)   Received from [facility]   Epic    In the past 12 months has the electric, gas, oil, or water  company threatened to shut off services in your home?: No  Health Literacy: Not on file    Family History:  Family History  Problem Relation Age of Onset   Liver cancer Father 61   Diabetes Maternal Grandmother    Pancreatic cancer Paternal Grandmother        64s    Medications:   Current Outpatient Medications on File Prior to Visit  Medication Sig Dispense Refill   amoxicillin  (AMOXIL ) 500 MG tablet Take 4 tablets (2,000 mg total) by mouth as directed. 1 hour prior to dental work including cleanings 12 tablet 0   aspirin  EC 81 MG tablet Take 1 tablet (81 mg total) by mouth daily. Take 1 tablet (81 mg total) by mouth daily starting the next day after completing high dose Aspirin . 30 tablet 2   clopidogrel  (PLAVIX ) 75 MG tablet Take 1 tablet (75 mg total) by mouth daily. 30 tablet 5   colchicine  0.6 MG tablet Take 1 tablet (0.6 mg total) by mouth daily.     No current facility-administered medications on file prior to visit.    Allergies:  No Known Allergies    OBJECTIVE:  Physical Exam  There were no vitals filed for this visit.  There is no height or weight on file to calculate BMI. No results found.  General: well developed, well nourished, pleasant middle-age female, seated, in no evident distress  Neurologic Exam Mental Status: Awake and fully alert.  Fluent speech and language.  Oriented to place and time. Recent and remote memory intact. Attention span, concentration and fund of knowledge appropriate. Mood and affect appropriate.  Cranial Nerves: Pupils equal, briskly reactive to light. Extraocular movements full without nystagmus. Visual fields full to confrontation. Hearing intact. Facial  sensation intact. Face, tongue, palate moves normally and symmetrically.  Motor: Normal bulk and tone. Normal strength in all tested extremity muscles Sensory.: intact to touch , pinprick , position and vibratory sensation.  Coordination: Rapid alternating movements normal in all extremities. Finger-to-nose and heel-to-shin performed accurately bilaterally. Gait and Station: Arises from chair without difficulty.  Able to stand from seated position with arms crossed.  Stance is normal. Gait demonstrates normal stride length and mild imbalance more so with RLE.  Tandem walk and heel toe with mild difficulty.  Reflexes: 1+ and symmetric. Toes downgoing.         ASSESSMENT: Sarah Holland is a 33 y.o. year old female with likely small left MCA stroke not detected on MRI on 06/07/2024, embolic pattern, likely related to large PFO. Vascular risk factors include large PFO, migraine headaches, gestational DM, protein C and S deficiency and recent dx of thalassemia and obesity.      PLAN:  Suspected left MCA stroke:  Residual deficit: Mild gait impairment with imbalance. Discussed typical recovery time.  If symptoms do not continue to improve, consider physical therapy evaluation. Continue aspirin  81mg  daily for secondary stroke prevention managed/prescribed by PCP.  She also remains on plavix  post PFO closure for additional 3 months Discussed secondary stroke prevention measures and importance of close PCP follow up for aggressive stroke risk factor management including BP goal<130/90, HLD with LDL goal<70 and DM with A1c.<7 .  Stroke labs 05/2024: LDL 77, A1c 5.5 I have gone over the pathophysiology of stroke, warning signs and symptoms, risk factors and their management in some detail with instructions to go to the closest emergency room for symptoms of concern.  Migraine headache: Worsening post stroke Recommend trial of Nurtec to use as needed, max 1 tab/24hrs. She was provided sample. She  will call if beneficial for official prescription. If no benefit, can try Ubrelvy.  Triptans contraindicated due to recent stroke Consider prophylactic therapy if migraines persist, discussed possible improvement if undergoes PFO closure  PFO: S/p closure 08/2024 c/b cardiac tamponade s/p pericardiocentesis Per cardiology, continue DAPT until 01/2025 then aspirin  alone ROPE score 8, 84% chance of relationship to stroke      Follow up in 4 months or call earlier if needed   CC:  [facility] provider: Dr. Rosemarie PCP: Sherial Bail, MD       Harlene Bogaert, [facility]  [facility] [address] [place], [place] [address]  Phone [phone] Fax [phone] Note: This document was prepared with digital dictation and possible smart phrase technology. Any transcriptional errors that result from this process are unintentional.

## 2025-08-17 ENCOUNTER — Ambulatory Visit: Admitting: Physician Assistant

## 2025-08-17 ENCOUNTER — Other Ambulatory Visit (HOSPITAL_COMMUNITY)
